# Patient Record
Sex: Female | Born: 1945 | Race: White | Hispanic: No | Smoking: Never smoker
Health system: Southern US, Community
[De-identification: ages and names within clinical notes are randomized; demographics above are authoritative.]

## PROBLEM LIST (undated history)

## (undated) DIAGNOSIS — C439 Malignant melanoma of skin, unspecified: Secondary | ICD-10-CM

## (undated) DIAGNOSIS — E049 Nontoxic goiter, unspecified: Secondary | ICD-10-CM

## (undated) DIAGNOSIS — R112 Nausea with vomiting, unspecified: Secondary | ICD-10-CM

## (undated) DIAGNOSIS — R200 Anesthesia of skin: Secondary | ICD-10-CM

## (undated) DIAGNOSIS — R202 Paresthesia of skin: Secondary | ICD-10-CM

## (undated) DIAGNOSIS — K219 Gastro-esophageal reflux disease without esophagitis: Secondary | ICD-10-CM

## (undated) DIAGNOSIS — M19049 Primary osteoarthritis, unspecified hand: Secondary | ICD-10-CM

## (undated) DIAGNOSIS — D649 Anemia, unspecified: Secondary | ICD-10-CM

## (undated) DIAGNOSIS — I10 Essential (primary) hypertension: Secondary | ICD-10-CM

## (undated) DIAGNOSIS — Z87898 Personal history of other specified conditions: Secondary | ICD-10-CM

## (undated) DIAGNOSIS — Z9889 Other specified postprocedural states: Secondary | ICD-10-CM

## (undated) HISTORY — DX: Essential (primary) hypertension: I10

## (undated) HISTORY — PX: APPENDECTOMY: SHX54

## (undated) HISTORY — PX: ESOPHAGOGASTRODUODENOSCOPY: SHX1529

## (undated) HISTORY — DX: Anemia, unspecified: D64.9

## (undated) HISTORY — PX: OTHER SURGICAL HISTORY: SHX169

## (undated) HISTORY — DX: Primary osteoarthritis, unspecified hand: M19.049

## (undated) HISTORY — PX: TUBAL LIGATION: SHX77

## (undated) HISTORY — DX: Gastro-esophageal reflux disease without esophagitis: K21.9

## (undated) HISTORY — PX: BREAST BIOPSY: SHX20

## (undated) HISTORY — DX: Malignant melanoma of skin, unspecified: C43.9

## (undated) HISTORY — DX: Nontoxic goiter, unspecified: E04.9

## (undated) HISTORY — PX: PARTIAL HYSTERECTOMY: SHX80

## (undated) HISTORY — DX: Personal history of other specified conditions: Z87.898

---

## 2000-02-06 ENCOUNTER — Encounter: Payer: Self-pay | Admitting: Family Medicine

## 2000-02-06 ENCOUNTER — Encounter: Admission: RE | Admit: 2000-02-06 | Discharge: 2000-02-06 | Payer: Self-pay | Admitting: Family Medicine

## 2000-02-06 ENCOUNTER — Other Ambulatory Visit: Admission: RE | Admit: 2000-02-06 | Discharge: 2000-02-06 | Payer: Self-pay | Admitting: Family Medicine

## 2001-05-09 LAB — FECAL OCCULT BLOOD, GUAIAC: Fecal Occult Blood: NEGATIVE

## 2004-05-08 ENCOUNTER — Other Ambulatory Visit: Admission: RE | Admit: 2004-05-08 | Discharge: 2004-05-08 | Payer: Self-pay | Admitting: Family Medicine

## 2004-09-27 ENCOUNTER — Ambulatory Visit: Payer: Self-pay | Admitting: Family Medicine

## 2006-01-03 ENCOUNTER — Ambulatory Visit: Payer: Self-pay | Admitting: Family Medicine

## 2006-01-06 ENCOUNTER — Encounter (INDEPENDENT_AMBULATORY_CARE_PROVIDER_SITE_OTHER): Payer: Self-pay | Admitting: Internal Medicine

## 2006-01-06 HISTORY — PX: OTHER SURGICAL HISTORY: SHX169

## 2006-01-06 LAB — CONVERTED CEMR LAB: Pap Smear: NORMAL

## 2006-01-24 ENCOUNTER — Other Ambulatory Visit: Admission: RE | Admit: 2006-01-24 | Discharge: 2006-01-24 | Payer: Self-pay | Admitting: Internal Medicine

## 2006-01-24 ENCOUNTER — Ambulatory Visit: Payer: Self-pay | Admitting: Family Medicine

## 2006-02-27 ENCOUNTER — Ambulatory Visit: Payer: Self-pay | Admitting: Family Medicine

## 2006-08-23 ENCOUNTER — Ambulatory Visit: Payer: Self-pay | Admitting: Family Medicine

## 2006-09-02 ENCOUNTER — Ambulatory Visit: Payer: Self-pay | Admitting: Family Medicine

## 2006-12-05 ENCOUNTER — Ambulatory Visit: Payer: Self-pay | Admitting: Internal Medicine

## 2006-12-05 ENCOUNTER — Encounter (INDEPENDENT_AMBULATORY_CARE_PROVIDER_SITE_OTHER): Payer: Self-pay | Admitting: *Deleted

## 2006-12-05 LAB — CONVERTED CEMR LAB
Bilirubin Urine: NEGATIVE
Glucose, Urine, Semiquant: NEGATIVE
Ketones, urine, test strip: NEGATIVE
Nitrite: NEGATIVE
Protein, U semiquant: 30
Specific Gravity, Urine: 1.02
Urobilinogen, UA: 0.2
pH: 5

## 2007-02-04 ENCOUNTER — Encounter (INDEPENDENT_AMBULATORY_CARE_PROVIDER_SITE_OTHER): Payer: Self-pay | Admitting: Internal Medicine

## 2007-02-04 DIAGNOSIS — G43909 Migraine, unspecified, not intractable, without status migrainosus: Secondary | ICD-10-CM

## 2007-02-04 DIAGNOSIS — Z85820 Personal history of malignant melanoma of skin: Secondary | ICD-10-CM

## 2007-02-04 DIAGNOSIS — E049 Nontoxic goiter, unspecified: Secondary | ICD-10-CM | POA: Insufficient documentation

## 2007-02-04 DIAGNOSIS — F449 Dissociative and conversion disorder, unspecified: Secondary | ICD-10-CM

## 2007-02-04 DIAGNOSIS — I10 Essential (primary) hypertension: Secondary | ICD-10-CM | POA: Insufficient documentation

## 2007-02-04 DIAGNOSIS — J309 Allergic rhinitis, unspecified: Secondary | ICD-10-CM | POA: Insufficient documentation

## 2007-02-04 DIAGNOSIS — K219 Gastro-esophageal reflux disease without esophagitis: Secondary | ICD-10-CM | POA: Insufficient documentation

## 2007-02-04 DIAGNOSIS — N950 Postmenopausal bleeding: Secondary | ICD-10-CM

## 2007-06-25 ENCOUNTER — Telehealth (INDEPENDENT_AMBULATORY_CARE_PROVIDER_SITE_OTHER): Payer: Self-pay | Admitting: Internal Medicine

## 2008-03-24 ENCOUNTER — Encounter (INDEPENDENT_AMBULATORY_CARE_PROVIDER_SITE_OTHER): Payer: Self-pay | Admitting: Internal Medicine

## 2008-04-08 HISTORY — PX: EYE SURGERY: SHX253

## 2008-08-03 ENCOUNTER — Ambulatory Visit: Payer: Self-pay | Admitting: Family Medicine

## 2008-08-03 ENCOUNTER — Encounter: Admission: RE | Admit: 2008-08-03 | Discharge: 2008-08-03 | Payer: Self-pay | Admitting: Family Medicine

## 2008-12-29 ENCOUNTER — Ambulatory Visit: Payer: Self-pay | Admitting: Family Medicine

## 2008-12-29 LAB — CONVERTED CEMR LAB: Rapid Strep: NEGATIVE

## 2009-03-17 ENCOUNTER — Telehealth (INDEPENDENT_AMBULATORY_CARE_PROVIDER_SITE_OTHER): Payer: Self-pay | Admitting: Internal Medicine

## 2009-03-22 ENCOUNTER — Encounter (INDEPENDENT_AMBULATORY_CARE_PROVIDER_SITE_OTHER): Payer: Self-pay | Admitting: Internal Medicine

## 2009-03-23 ENCOUNTER — Ambulatory Visit: Payer: Self-pay | Admitting: Family Medicine

## 2009-03-23 ENCOUNTER — Other Ambulatory Visit: Admission: RE | Admit: 2009-03-23 | Discharge: 2009-03-23 | Payer: Self-pay | Admitting: Family Medicine

## 2009-03-23 ENCOUNTER — Encounter (INDEPENDENT_AMBULATORY_CARE_PROVIDER_SITE_OTHER): Payer: Self-pay | Admitting: Internal Medicine

## 2009-03-23 DIAGNOSIS — N951 Menopausal and female climacteric states: Secondary | ICD-10-CM

## 2009-03-24 ENCOUNTER — Encounter (INDEPENDENT_AMBULATORY_CARE_PROVIDER_SITE_OTHER): Payer: Self-pay | Admitting: Internal Medicine

## 2009-03-24 ENCOUNTER — Ambulatory Visit: Payer: Self-pay | Admitting: Family Medicine

## 2009-03-29 ENCOUNTER — Encounter (INDEPENDENT_AMBULATORY_CARE_PROVIDER_SITE_OTHER): Payer: Self-pay | Admitting: Internal Medicine

## 2009-03-29 ENCOUNTER — Encounter: Payer: Self-pay | Admitting: Family Medicine

## 2009-03-30 LAB — CONVERTED CEMR LAB
ALT: 14 units/L (ref 0–35)
AST: 18 units/L (ref 0–37)
BUN: 12 mg/dL (ref 6–23)
CO2: 27 meq/L (ref 19–32)
Calcium: 9.4 mg/dL (ref 8.4–10.5)
Chloride: 110 meq/L (ref 96–112)
Cholesterol: 150 mg/dL (ref 0–200)
Creatinine, Ser: 0.9 mg/dL (ref 0.4–1.2)
GFR calc non Af Amer: 67.51 mL/min (ref >60)
Glucose, Bld: 90 mg/dL (ref 70–99)
HDL: 58.9 mg/dL (ref >39.00)
LDL Cholesterol: 59 mg/dL (ref 0–99)
Potassium: 3.9 meq/L (ref 3.5–5.1)
Sodium: 142 meq/L (ref 135–145)
TSH: 1.37 microintl units/mL (ref 0.35–5.50)
Total CHOL/HDL Ratio: 3
Triglycerides: 160 mg/dL — ABNORMAL HIGH (ref 0.0–149.0)
VLDL: 32 mg/dL (ref 0.0–40.0)
Vit D, 25-Hydroxy: 32 ng/mL (ref 30–89)

## 2009-03-31 ENCOUNTER — Encounter (INDEPENDENT_AMBULATORY_CARE_PROVIDER_SITE_OTHER): Payer: Self-pay | Admitting: *Deleted

## 2009-04-05 ENCOUNTER — Encounter (INDEPENDENT_AMBULATORY_CARE_PROVIDER_SITE_OTHER): Payer: Self-pay | Admitting: *Deleted

## 2009-05-05 ENCOUNTER — Ambulatory Visit: Payer: Self-pay | Admitting: Family Medicine

## 2009-06-09 ENCOUNTER — Ambulatory Visit: Payer: Self-pay | Admitting: Family Medicine

## 2009-11-01 ENCOUNTER — Ambulatory Visit: Payer: Self-pay | Admitting: Family Medicine

## 2009-11-01 DIAGNOSIS — M19049 Primary osteoarthritis, unspecified hand: Secondary | ICD-10-CM | POA: Insufficient documentation

## 2010-03-27 ENCOUNTER — Ambulatory Visit: Payer: Self-pay | Admitting: Family Medicine

## 2010-03-27 LAB — CONVERTED CEMR LAB
BUN: 16 mg/dL (ref 6–23)
Basophils Absolute: 0 10*3/uL (ref 0.0–0.1)
Bilirubin, Direct: 0.1 mg/dL (ref 0.0–0.3)
CO2: 26 meq/L (ref 19–32)
Chloride: 107 meq/L (ref 96–112)
Cholesterol: 176 mg/dL (ref 0–200)
Creatinine, Ser: 0.8 mg/dL (ref 0.4–1.2)
Eosinophils Absolute: 0.1 10*3/uL (ref 0.0–0.7)
LDL Cholesterol: 72 mg/dL (ref 0–99)
MCHC: 34.8 g/dL (ref 30.0–36.0)
MCV: 90.2 fL (ref 78.0–100.0)
Monocytes Absolute: 0.6 10*3/uL (ref 0.1–1.0)
Neutrophils Relative %: 47.2 % (ref 43.0–77.0)
Platelets: 371 10*3/uL (ref 150.0–400.0)
RDW: 13 % (ref 11.5–14.6)
Total Bilirubin: 0.4 mg/dL (ref 0.3–1.2)
Triglycerides: 187 mg/dL — ABNORMAL HIGH (ref 0.0–149.0)
WBC: 5.9 10*3/uL (ref 4.5–10.5)

## 2010-03-30 ENCOUNTER — Encounter: Payer: Self-pay | Admitting: Family Medicine

## 2010-03-31 ENCOUNTER — Encounter: Payer: Self-pay | Admitting: Family Medicine

## 2010-04-03 ENCOUNTER — Ambulatory Visit: Payer: Self-pay | Admitting: Family Medicine

## 2010-04-26 ENCOUNTER — Ambulatory Visit: Payer: Self-pay | Admitting: Family Medicine

## 2010-05-01 ENCOUNTER — Telehealth: Payer: Self-pay | Admitting: Family Medicine

## 2010-06-14 ENCOUNTER — Ambulatory Visit: Payer: Self-pay | Admitting: Family Medicine

## 2010-06-14 DIAGNOSIS — L738 Other specified follicular disorders: Secondary | ICD-10-CM

## 2010-06-21 ENCOUNTER — Telehealth: Payer: Self-pay | Admitting: Family Medicine

## 2010-08-08 NOTE — Progress Notes (Signed)
  Phone Note Outgoing Call   Call placed by: Mills Koller,  May 01, 2010 3:33 PM Call placed to: Patient Summary of Call: FYI  Ifob not returned, spoke to patient , she will do it and send it in Initial call taken by: Mills Koller,  May 01, 2010 3:34 PM

## 2010-08-08 NOTE — Assessment & Plan Note (Signed)
Summary: ROA FOR FOLLOW-UP/JRR   Vital Signs:  Patient profile:   65 year old female Height:      59.75 inches Weight:      132.50 pounds BMI:     26.19 Temp:     97.9 degrees F oral Pulse rate:   80 / minute Pulse rhythm:   regular BP sitting:   116 / 74  (left arm) Cuff size:   regular  Vitals Entered By: Lewanda Rife LPN (April 03, 2010 8:35 AM) CC: follow-up visit   History of Present Illness: here for check up  wt is stable with bmi of 26  HTN is well controlled at 116/74  other wellness labs all nl   pap 9/10 nl --no abn paps and no symptoms  mam 9/11 nl  self exam - tends to be lumpy in general/ fibrocystic - no changes  is still taking premarin  she cannot come off it -- she has absolutely intolerable side eff and she cannot tolerate it   colon screen-- is not interested in colonoscopy  will do stool card  tends to stay constipated- her whole life   started walking - and that helps  has not needed celebrex in a while - has hand OA  Td 05 zostavax last fall flu shot   nl dexa 9/10 ca and D  cholesterol is great with high HDL and LDL of 72   mother and sister had breast cancer    Allergies: 1)  * Sulfa (Sulfonamides) Group  Past History:  Past Medical History: Allergic rhinitis GERD Hypertension melanoma  menopausal syndrome  migraine  ? goiter  fibrocystic breasts  strong fam hx of breast cancer  OA - in hands     derm- Dr Yetta Barre   Past Surgical History:                     Appendectomy  ~ 1985         Hysterectomy - ovaries in                     BTL 8/01             EGD -nml - 24 hour PH probe (-) 5/05             EMG's lef tupper arm (-) 5/05             MRI C-Spine - minor disc changes/osteophites, minor focal disk bulge C6-7 11/02           Bone densitometry (-), 7/07(-) 04/2008--L eye surgery for double vision nl breast bx in distant past   Review of Systems General:  Denies fatigue, loss of appetite, and  malaise. Eyes:  Denies blurring and eye irritation. CV:  Denies chest pain or discomfort, lightheadness, and palpitations. Resp:  Denies cough and shortness of breath. GI:  Denies abdominal pain, change in bowel habits, indigestion, and nausea. GU:  Denies dysuria and urinary frequency. MS:  Denies joint pain, joint redness, and joint swelling. Derm:  Denies itching, lesion(s), poor wound healing, and rash. Neuro:  Denies numbness and tingling. Psych:  Denies anxiety and depression. Endo:  Denies excessive thirst and excessive urination. Heme:  Denies abnormal bruising and bleeding.  Physical Exam  General:  Well-developed,well-nourished,in no acute distress; alert,appropriate and cooperative throughout examination Head:  normocephalic, atraumatic, and no abnormalities observed.   Eyes:  vision grossly intact, pupils equal, pupils round, and pupils reactive to light.  no conjunctival pallor, injection or icterus  Ears:  R ear normal and L ear normal.   Nose:  no nasal discharge.   Mouth:  pharynx pink and moist.   Neck:  supple with full rom and no masses or thyromegally, no JVD or carotid bruit  Chest Wall:  No deformities, masses, or tenderness noted. Breasts:  No mass, nodules, thickening, tenderness, bulging, retraction, inflamation, nipple discharge or skin changes noted.   Lungs:  Normal respiratory effort, chest expands symmetrically. Lungs are clear to auscultation, no crackles or wheezes. Heart:  Normal rate and regular rhythm. S1 and S2 normal without gallop, murmur, click, rub or other extra sounds. Abdomen:  Bowel sounds positive,abdomen soft and non-tender without masses, organomegaly or hernias noted. no renal bruits  Msk:  No deformity or scoliosis noted of thoracic or lumbar spine.  no acute joint changes  Pulses:  R and L carotid,radial,femoral,dorsalis pedis and posterior tibial pulses are full and equal bilaterally Extremities:  No clubbing, cyanosis, edema, or  deformity noted with normal full range of motion of all joints.   Neurologic:  sensation intact to light touch, gait normal, and DTRs symmetrical and normal.   Skin:  Intact without suspicious lesions or rashes Cervical Nodes:  No lymphadenopathy noted Inguinal Nodes:  No significant adenopathy Psych:  normal affect, talkative and pleasant    Impression & Recommendations:  Problem # 1:  HEALTH MAINTENANCE EXAM (ICD-V70.0) Assessment Comment Only reviewed health habits including diet, exercise and skin cancer prevention reviewed health maintenance list and family history pt declines colonosc- will do stool card rev labs in detail- good chol  Problem # 2:  HX, FAMILY, MALIGNANCY, BREAST (ICD-V16.3) Assessment: Unchanged pt understands risks of hrt and absolutely refuses to go off of it  claims she is totally disabled by menopausal symptoms off of it and cannot function  may try to cut dose  disc this in detail   Problem # 3:  HRT (ICD-V07.4) Assessment: Unchanged see above - disc risks in detail  Problem # 4:  HYPERTENSION (ICD-401.9) Assessment: Unchanged  good control on low dose ace Her updated medication list for this problem includes:    Lisinopril 2.5 Mg Tabs (Lisinopril) .Marland Kitchen... 1 by mouth once daily  BP today: 116/74 Prior BP: 124/84 (11/01/2009)  Labs Reviewed: K+: 4.4 (03/27/2010) Creat: : 0.8 (03/27/2010)   Chol: 176 (03/27/2010)   HDL: 66.30 (03/27/2010)   LDL: 72 (03/27/2010)   TG: 187.0 (03/27/2010)  Complete Medication List: 1)  Celebrex 200 Mg Caps (Celecoxib) .... Take 1 capsule by mouth twice a day as needed 2)  Imitrex 20 Mg/act Soln (Sumatriptan) .... Use at onset of headache 3)  Xanax 0.25 Mg Tabs (Alprazolam) .Marland Kitchen.. 1 by mouth once daily as needed for airplane flight 4)  Tylenol Arthritis Pain 650 Mg Tbcr (Acetaminophen) .... As needed 5)  Premarin 0.625 Mg Tabs (Estrogens conjugated) .Marland Kitchen.. 1 each day for hrt 6)  Lisinopril 2.5 Mg Tabs (Lisinopril) .Marland Kitchen..  1 by mouth once daily  Patient Instructions: 1)  you can start cutting hormone dose any time  2)  labs look good 3)  try to keep up good diet and exercise  4)  no change in medicines 5)  do stool card please  Prescriptions: XANAX 0.25 MG TABS (ALPRAZOLAM) 1 by mouth once daily as needed for airplane flight  #5 x 0   Entered and Authorized by:   Judith Part MD   Signed by:   Judith Part MD  on 04/03/2010   Method used:   Print then Give to Patient   RxID:   [id number]   Current Allergies (reviewed today): * SULFA (SULFONAMIDES) GROUP   Preventive Care Screening  Contraindications of Treatment or Deferment of Test/Procedure:    Test/Procedure: Colonoscopy    Reason for deferment: patient declined

## 2010-08-08 NOTE — Letter (Signed)
Summary: Results Follow up Letter  [place] at [facility]  [address] [place], [place] [address]   Phone: [phone]  Fax: [phone]    04/03/2010 MRN: [id number]    Joann Buchanan [address] [place], [place]  [address]    Dear [name],  The following are the results of your recent test(s):  Test         Result    Pap Smear:        Normal _____  Not Normal _____ Comments: ______________________________________________________ Cholesterol: LDL(Bad cholesterol):         Your goal is less than:         HDL (Good cholesterol):       Your goal is more than: Comments:  ______________________________________________________ Mammogram:        Normal ___X__  Not Normal _____ Comments:Repeat in one year.   ___________________________________________________________________ Hemoccult:        Normal _____  Not normal _______ Comments:    _____________________________________________________________________ Other Tests:    We routinely do not discuss normal results over the telephone.  If you desire a copy of the results, or you have any questions about this information we can discuss them at your next office visit.   Sincerely,    Idamae Schuller Tower,MD  MT/ri

## 2010-08-08 NOTE — Assessment & Plan Note (Signed)
Summary: CHECK BUMPS ON LEGS/CLE   Vital Signs:  Patient profile:   65 year old female Height:      59.75 inches Weight:      134 pounds BMI:     26.48 Temp:     97.9 degrees F oral Pulse rate:   80 / minute Pulse rhythm:   regular BP sitting:   138 / 80  (left arm) Cuff size:   regular  Vitals Entered By: Lewanda Rife LPN (June 14, 2010 11:24 AM) CC: ck bumps on legs, pt says legs feel warm and swollen   History of Present Illness: broke out on her legs on thursday -- almost a week ago   she tends to break out in past with tennis shoes and socks  did that tues and wed  started as little red bumps that turn into brown spots  sore -- with warmth  worse when on her feet hurt to bump them  not itchy   legs have been a bit swollen  she feets good  no fleas in the house   usually shaves regularly   does use downy sheets and tide detergent   has mild varicose veins -- small spider veins   Allergies: 1)  * Sulfa (Sulfonamides) Group  Past History:  Past Medical History: Last updated: 04/03/2010 Allergic rhinitis GERD Hypertension melanoma  menopausal syndrome  migraine  ? goiter  fibrocystic breasts  strong fam hx of breast cancer  OA - in hands     derm- Dr Yetta Barre   Past Surgical History: Last updated: 04/03/2010                     Appendectomy  ~ 1985         Hysterectomy - ovaries in                     BTL 8/01             EGD -nml - 24 hour PH probe (-) 5/05             EMG's lef tupper arm (-) 5/05             MRI C-Spine - minor disc changes/osteophites, minor focal disk bulge C6-7 11/02           Bone densitometry (-), 7/07(-) 04/2008--L eye surgery for double vision nl breast bx in distant past   Social History: Last updated: 03/23/2009 Marital Status: Married Children: 2--3 grand children Occupation: [employer] retire the first of 2011--08/07/2009  Risk Factors: Alcohol Use: 0 (03/23/2009) Caffeine Use: 3  (03/23/2009) Exercise: yes (03/23/2009)  Risk Factors: Smoking Status: never (03/23/2009)  Review of Systems General:  Denies sleep disorder, sweats, weakness, and weight loss. Eyes:  Denies blurring and eye irritation. CV:  Denies chest pain or discomfort and palpitations. Resp:  Denies cough and wheezing. GI:  Denies abdominal pain, nausea, and vomiting. GU:  Denies genital sores. MS:  Denies joint pain. Derm:  Complains of rash; denies itching. Neuro:  Denies headaches and tingling. Heme:  Denies abnormal bruising and bleeding.  Physical Exam  General:  Well-developed,well-nourished,in no acute distress; alert,appropriate and cooperative throughout examination Head:  normocephalic, atraumatic, and no abnormalities observed.   Mouth:  pharynx pink and moist.   Neck:  supple with full rom and no masses or thyromegally, no JVD or carotid bruit  Lungs:  Normal respiratory effort, chest expands symmetrically. Lungs are clear to  auscultation, no crackles or wheezes. Heart:  Normal rate and regular rhythm. S1 and S2 normal without gallop, murmur, click, rub or other extra sounds. Msk:  no acute joint changes  Pulses:  R and L carotid,radial,femoral,dorsalis pedis and posterior tibial pulses are full and equal bilaterally Extremities:  no cce  Neurologic:  sensation intact to light touch and gait normal.   Skin:  lower leg under knee diffuse patchy (papulomacular rash) with redness consistent with folliculitis no pustules no varicosities  no material to cx  Cervical Nodes:  No lymphadenopathy noted Psych:  normal affect, talkative and pleasant    Impression & Recommendations:  Problem # 1:  FOLLICULITIS (ICD-704.8) Assessment New  of lower legs with redness and soreness will tx with keflex  urged not to shave and get rid of contam razor  keflex 1 wk/ elicon as needed  if not imp will call for derm ref and poss bx  Orders: Prescription Created Electronically  [phone])  Complete Medication List: 1)  Celebrex 200 Mg Caps (Celecoxib) .... Take 1 capsule by mouth twice a day as needed 2)  Imitrex 20 Mg/act Soln (Sumatriptan) .... Use at onset of headache 3)  Xanax 0.25 Mg Tabs (Alprazolam) .Marland Kitchen.. 1 by mouth once daily as needed for airplane flight 4)  Tylenol Arthritis Pain 650 Mg Tbcr (Acetaminophen) .... As needed 5)  Premarin 0.625 Mg Tabs (Estrogens conjugated) .Marland Kitchen.. 1 each day for hrt 6)  Lisinopril 2.5 Mg Tabs (Lisinopril) .Marland Kitchen.. 1 by mouth once daily 7)  Keflex 500 Mg Caps (Cephalexin) .Marland Kitchen.. 1 by mouth two times a day for 7 days 8)  Elocon 0.1 % Crea (Mometasone furoate) .... Apply to affected area once daily as needed  Patient Instructions: 1)  throw away razor 2)  no shaving legs for 2-3 weeks 3)  take keflex  4)  use elicon cream daily as needed  5)  elevate legs when able and keep them cool 6)  consider fragrance free detergent and no fabric softener  7)  if not improved in 7-10 days call here for dermatology referral Prescriptions: ELOCON 0.1 % CREA (MOMETASONE FUROATE) apply to affected area once daily as needed  #1 medium x 0   Entered and Authorized by:   Judith Part MD   Signed by:   Judith Part MD on 06/14/2010   Method used:   [facility]        [facility] [facility]       [address]       [place], [place]  [address]       Ph: [phone] or [phone]       Fax: [phone]   RxID:   [id number] KEFLEX 500 MG CAPS (CEPHALEXIN) 1 by mouth two times a day for 7 days  #14 x 0   Entered and Authorized by:   Judith Part MD   Signed by:   Judith Part MD on 06/14/2010   Method used:   [facility]        [facility]       [address]       [place], [place]  [address]       Ph: [phone] or [phone]       Fax: [phone]   RxID:   [id number]    Orders Added: 1)  Prescription Created Electronically [G8553] 2)  Est. Patient Level III  [06237]    Current Allergies (reviewed today): *  SULFA (SULFONAMIDES) GROUP

## 2010-08-08 NOTE — Assessment & Plan Note (Signed)
Summary: ROA 6 MTHS CYD   Vital Signs:  Patient profile:   65 year old female Height:      59.75 inches Weight:      132.75 pounds BMI:     26.24 Temp:     98 degrees F oral Pulse rate:   76 / minute Pulse rhythm:   regular BP sitting:   124 / 84  (left arm) Cuff size:   regular  Vitals Entered By: Lewanda Rife LPN (November 01, 2009 8:08 AM) CC: six month visit   History of Present Illness: here for f/u of HTN and other chronic problems   wt is stable  bp is well controlled 124/.84 with lisinopril  is getting into an exercise pattern with retirement    lab good in fall incl LDL 59 diet is overall good   premarin for menopausal symptoms  did disc going down on the dose further-- wants to stay at that dose  has night sweats and does not sleep well- is awake every hour  has taken tylenol pm works for 3 hours  no mood symptoms     celebrex -- was on for a while  has arthritis in her fingers -- esp middle finger L hand and index  is really bad grip is terrible hips hurt  bottoms of feet    Allergies: 1)  * Sulfa (Sulfonamides) Group  Past History:  Past Surgical History: Last updated: 12/29/2008                     Appendectomy  ~ 1985         Hysterectomy - ovaries in                     BTL 8/01             EGD -nml - 24 hour PH probe (-) 5/05             EMG's lef tupper arm (-) 5/05             MRI C-Spine - minor disc changes/osteophites, minor focal disk bulge C6-7 11/02           Bone densitometry (-), 7/07(-) 04/2008--L eye surgery for double vision  Social History: Last updated: 03/23/2009 Marital Status: Married Children: 2--3 grand children Occupation: [employer] retire the first of 2011--08/07/2009  Risk Factors: Alcohol Use: 0 (03/23/2009) Caffeine Use: 3 (03/23/2009) Exercise: yes (03/23/2009)  Risk Factors: Smoking Status: never (03/23/2009)  Past Medical History: Allergic rhinitis GERD Hypertension melanoma    menopausal syndrome  migraine  ? goiter     derm- Dr Yetta Barre   Review of Systems General:  Complains of fatigue; denies loss of appetite and malaise. Eyes:  Denies blurring and eye irritation. CV:  Denies chest pain or discomfort, lightheadness, palpitations, and shortness of breath with exertion. Resp:  Denies cough and wheezing. GI:  Denies abdominal pain, bloody stools, change in bowel habits, indigestion, and nausea. GU:  Denies discharge and dysuria. MS:  Complains of joint pain, joint swelling, and stiffness; denies joint redness, muscle aches, cramps, and muscle weakness. Derm:  Denies itching, lesion(s), poor wound healing, and rash. Neuro:  Denies numbness and tingling. Psych:  Denies anxiety and depression. Heme:  Denies abnormal bruising and bleeding.  Physical Exam  General:  Well-developed,well-nourished,in no acute distress; alert,appropriate and cooperative throughout examination Head:  normocephalic, atraumatic, and no abnormalities observed.   Eyes:  vision grossly intact,  pupils equal, pupils round, pupils reactive to light, and no injection.   Ears:  R ear normal and L ear normal.   Nose:  nares are mildly pale and boggy Mouth:  pharynx pink and moist, no erythema, and no exudates.   Neck:  supple with full rom and no masses or thyromegally, no JVD or carotid bruit  large- nl thyroid size - symmetric Chest Wall:  No deformities, masses, or tenderness noted. Lungs:  Normal respiratory effort, chest expands symmetrically. Lungs are clear to auscultation, no crackles or wheezes. Heart:  Normal rate and regular rhythm. S1 and S2 normal without gallop, murmur, click, rub or other extra sounds. Abdomen:  Bowel sounds positive,abdomen soft and non-tender without masses, organomegaly or hernias noted. no renal bruits  Msk:  L 4th finger and R 2nd finger- tender herberden's nodes R 5th finger- mild triggering  no redness  no tophi noted nl rom fingers  Pulses:  R and  L carotid,radial,femoral,dorsalis pedis and posterior tibial pulses are full and equal bilaterally Extremities:  No clubbing, cyanosis, edema, or deformity noted with normal full range of motion of all joints.   Neurologic:  sensation intact to light touch, gait normal, and DTRs symmetrical and normal.   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 Take 1 capsule by mouth twice a day as needed    Tylenol Arthritis Pain 650 Mg Tbcr (Acetaminophen) .Marland Kitchen... As needed  Orders: Orthopedic Referral (Ortho)  Problem # 3:  POSTMENOPAUSAL STATUS (ICD-V49.81) Assessment: Unchanged pt wants to stay on current premarin dose -- menopause symptoms are bothersome- esp sleep   Problem # 4:  HYPERTENSION (ICD-401.9) in good control disc exercise plan  no change in med plan lab and fu fall- PE  rev sept labs- good  The following medications were removed from the medication list:    Lisinopril 5 Mg Tabs (Lisinopril) .Marland Kitchen... Take 1 once daily for bp Her updated medication list for this problem includes:    Lisinopril 2.5 Mg Tabs (Lisinopril) .Marland Kitchen... 1 by mouth once daily  Complete Medication List: 1)  Celebrex 200 Mg Caps (Celecoxib) .... Take 1 capsule by mouth twice a day as needed 2)  Imitrex 20 Mg/act Soln (Sumatriptan) .... Use at onset of headache 3)  Xanax 0.25 Mg Tabs (Alprazolam) .... As needed for travel 4)  Tylenol Arthritis Pain 650 Mg Tbcr (Acetaminophen) .... As  needed 5)  Premarin 0.625 Mg Tabs (Estrogens conjugated) .Marland Kitchen.. 1 each day for hrt 6)  Lisinopril 2.5 Mg Tabs (Lisinopril) .Marland Kitchen.. 1 by mouth once daily  Other Orders: Dermatology Referral (Derma)  Patient Instructions: 1)  we will do derm referral and also hand doctor ref at check out  2)  try claritin or allegra or zyrtec over the counter for allergies  3)  follow up with your eye doctor as scheduled  4)  keep working on diet and exercise  5)  schedule fasting labs in late sept then PE  6)  lipids , wellness  v70.0, 401.1  7)  we will set up mammogram at check out   Prescriptions: LISINOPRIL 2.5 MG TABS (LISINOPRIL) 1 by mouth once daily  #90 x 3   Entered and Authorized by:   Judith Part MD   Signed by:   Judith Part MD on 11/01/2009   Method used:   Print then Give to Patient   RxID:   [id number] PREMARIN 0.625 MG TABS (ESTROGENS CONJUGATED) 1 each day for HRT  #90 x 3   Entered and Authorized by:   Judith Part MD   Signed by:   Judith Part MD on 11/01/2009   Method used:   Print then Give to Patient   RxID:   [id number]   Current Allergies (reviewed today): * SULFA (SULFONAMIDES) GROUP

## 2010-08-08 NOTE — Letter (Signed)
Summary: [facility]: Immunoassay Fecal Occult Blood (iFOB) Order Form  [place] at [facility]  [address] [place], [place] [address]   Phone: [phone]  Fax: [phone]      [place] [place] [facility]: Immunoassay Fecal Occult Blood (iFOB) Order Form   April 03, 2010 MRN: [id number]   Joann Buchanan Aug 03, 1945   Physicican Name:______Tower___________________  Diagnosis Code:____________V76.51______________      Judith Part MD

## 2010-08-08 NOTE — Miscellaneous (Signed)
Summary: Controlled Substances Contract  Controlled Substances Contract   Imported By: Maryln Gottron 04/10/2010 09:49:09  _____________________________________________________________________  External Attachment:    Type:   Image     Comment:   External Document

## 2010-08-10 NOTE — Progress Notes (Signed)
Summary: requests more abx  Phone Note Call from Patient Call back at Home Phone [phone]   Caller: Patient Call For: Judith Part MD Summary of Call: Pt was seen last week for a rash on her legs.  She was told to call back today if not completely gone.  She says she is much better but still has several splotches, skin feels tight and tingly.  She is asking for a refill on keflex to be [facility].  She thinks one more round will take care of the rash. Initial call taken by: Lowella Petties CMA, AAMA,  June 21, 2010 8:43 AM  Follow-up for Phone Call        can refil keflex times one-- but if not resolved at that time please f/u px written on EMR for call in  Follow-up by: Judith Part MD,  June 21, 2010 9:40 AM  Additional Follow-up for Phone Call Additional follow up Details #1::        Patient notified as instructed by telephone. med [facility] as instructed.Lewanda Rife LPN  June 21, 2010 11:02 AM     Prescriptions: KEFLEX 500 MG CAPS (CEPHALEXIN) 1 by mouth two times a day for 7 days  #14 x 0   Entered by:   Lewanda Rife LPN   Authorized by:   Judith Part MD   Signed by:   Lewanda Rife LPN on 14/78/2956   Method used:   [facility]        [facility] [facility]       [address]       [place], [place]  [address]       Ph: [phone] or [phone]       Fax: [phone]   RxID:   [id number]

## 2010-09-27 ENCOUNTER — Other Ambulatory Visit: Payer: Self-pay | Admitting: Family Medicine

## 2010-10-03 ENCOUNTER — Other Ambulatory Visit: Payer: Self-pay | Admitting: Family Medicine

## 2010-10-05 NOTE — Telephone Encounter (Signed)
Pt needs to call for appt. 

## 2011-06-01 ENCOUNTER — Other Ambulatory Visit: Payer: Self-pay | Admitting: Family Medicine

## 2011-08-21 ENCOUNTER — Encounter: Payer: Self-pay | Admitting: Family Medicine

## 2011-08-27 ENCOUNTER — Encounter: Payer: Self-pay | Admitting: *Deleted

## 2011-10-15 ENCOUNTER — Ambulatory Visit (INDEPENDENT_AMBULATORY_CARE_PROVIDER_SITE_OTHER): Payer: 59 | Admitting: Family Medicine

## 2011-10-15 ENCOUNTER — Encounter: Payer: Self-pay | Admitting: Family Medicine

## 2011-10-15 VITALS — BP 144/84 | HR 84 | Temp 98.1°F | Wt 135.8 lb

## 2011-10-15 DIAGNOSIS — G43909 Migraine, unspecified, not intractable, without status migrainosus: Secondary | ICD-10-CM

## 2011-10-15 MED ORDER — CYCLOBENZAPRINE HCL 5 MG PO TABS: 5.0000 mg | ORAL_TABLET | Freq: Two times a day (BID) | ORAL | Status: AC | PRN

## 2011-10-15 MED ORDER — SUMATRIPTAN 20 MG/ACT NA SOLN: 1.0000 | NASAL | Status: DC | PRN

## 2011-10-15 NOTE — Patient Instructions (Addendum)
Sounds like previous migraines. May try low dose flexeril at home (may make you sleepy) for stopping migraines. i've sent in refill of sumatriptan nasal spray. If not improving, or migraines worsening or changing more , let us know we may want to see you again.

## 2011-10-15 NOTE — Assessment & Plan Note (Addendum)
Episode last week similar to previous migraines. Refilled sumatriptan nasal spray to use prn migraines, discussed use. May try flexeril as well abortively Nonfocal neuro exam. If worsening or changing migraine, return to see [place]. Advised contact PCP for premarin refill.

## 2011-10-15 NOTE — Progress Notes (Signed)
  Subjective:    Patient ID: Joann Buchanan, female    DOB: 03-23-1946, 66 y.o.   MRN: [id number]  HPI CC: migraine  66 yo with h/o melanoma, migraines, and HTN.  Presents with 1 wk h/o migraine, associated with nausea/vomiting, mild dizziness described as "off balance" and walking at an ankle.  Migraines usually associated with photophobia, n/v, imbalance.  This one has lasted longer than usual (usually after 1-2 days feels better).  Yesterday went to church and felt ok but towards end of church started feeling worse.  Stayed in bed yesterday.  No falls, no vertigo.  Feels pain across entire forehead.  Currently no pain, nausea but does have sensation of facial fullness.  Has run out of nasal sumatriptan and premarin, requests refill.  Off premarin for last year.  New visual changes described as flickering lights in superior 1/4 of vision.  No fevers/chills, abd pain, chest pain or tightness.  Denies slurred speech, weakness or numbness on one side of body.  Usually takes [facility] powders for migraines, this one was different.  Last bad migraine was 4 mo ago.  Ran out of nasal sumatriptan about 1 yr ago.  Lab Results  Component Value Date   CREATININE 0.8 03/27/2010    BP Readings from Last 3 Encounters:  10/15/11 144/84  06/14/10 138/80  04/03/10 116/74   Past Medical History  Diagnosis Date  . Allergic rhinitis   . GERD (gastroesophageal reflux disease)   . HTN (hypertension)   . Melanoma   . Migraine   . Hx of fibrocystic disease of breast   . Osteoarthritis of hand   . Goiter     ?      Review of Systems Per HPI    Objective:   Physical Exam  Nursing note and vitals reviewed. Constitutional: She appears well-developed and well-nourished. No distress.  HENT:  Head: Normocephalic and atraumatic.  Mouth/Throat: Oropharynx is clear and moist. No oropharyngeal exudate.  Eyes: Conjunctivae and EOM are normal. Pupils are equal, round, and reactive to Buchanan. No scleral  icterus.  Neck: Normal range of motion. Neck supple.  Cardiovascular: Normal rate, regular rhythm, normal heart sounds and intact distal pulses.   No murmur heard. Pulmonary/Chest: Effort normal and breath sounds normal. No respiratory distress. She has no wheezes. She has no rales.  Musculoskeletal: She exhibits no edema.  Lymphadenopathy:    She has no cervical adenopathy.  Neurological: She has normal strength. No cranial nerve deficit or sensory deficit. She displays a negative Romberg sign. Coordination and gait normal.  Reflex Scores:      Bicep reflexes are 3+ on the right side and 3+ on the left side.      Patellar reflexes are 3+ on the right side and 3+ on the left side.      Hyperreflexic at baseline  Skin: Skin is warm and dry. No rash noted.  Psychiatric: She has a normal mood and affect.      Assessment & Plan:

## 2011-10-16 ENCOUNTER — Other Ambulatory Visit: Payer: Self-pay

## 2011-10-16 MED ORDER — ESTROGENS CONJUGATED 0.625 MG PO TABS: ORAL_TABLET | ORAL | Status: DC

## 2011-10-16 NOTE — Telephone Encounter (Signed)
Pt walked in and left note needed refill on Premarin 0.625 mg. #90 x 0 to [facility] and pt will call for appt to see Dr Milinda Antis before med runs out. Pt saw Dr Sharen Hones 10/15/11 for h/a.Pt is aware med sent to [facility].

## 2012-01-14 ENCOUNTER — Ambulatory Visit (INDEPENDENT_AMBULATORY_CARE_PROVIDER_SITE_OTHER): Payer: 59 | Admitting: Family Medicine

## 2012-01-14 ENCOUNTER — Encounter: Payer: Self-pay | Admitting: Family Medicine

## 2012-01-14 VITALS — BP 120/80 | HR 86 | Temp 97.9°F | Ht 60.0 in | Wt 134.8 lb

## 2012-01-14 DIAGNOSIS — K219 Gastro-esophageal reflux disease without esophagitis: Secondary | ICD-10-CM

## 2012-01-14 DIAGNOSIS — I10 Essential (primary) hypertension: Secondary | ICD-10-CM

## 2012-01-14 DIAGNOSIS — G43909 Migraine, unspecified, not intractable, without status migrainosus: Secondary | ICD-10-CM

## 2012-01-14 DIAGNOSIS — Z780 Asymptomatic menopausal state: Secondary | ICD-10-CM

## 2012-01-14 LAB — COMPREHENSIVE METABOLIC PANEL
Albumin: 3.8 g/dL (ref 3.5–5.2)
Alkaline Phosphatase: 66 U/L (ref 39–117)
CO2: 26 mEq/L (ref 19–32)
Calcium: 9.6 mg/dL (ref 8.4–10.5)
Chloride: 106 mEq/L (ref 96–112)
GFR: 66.05 mL/min (ref >60.00)
Glucose, Bld: 84 mg/dL (ref 70–99)
Potassium: 4.1 mEq/L (ref 3.5–5.1)
Sodium: 139 mEq/L (ref 135–145)
Total Protein: 6.9 g/dL (ref 6.0–8.3)

## 2012-01-14 LAB — CBC WITH DIFFERENTIAL/PLATELET
Eosinophils Relative: 0.9 % (ref 0.0–5.0)
Lymphocytes Relative: 35.7 % (ref 12.0–46.0)
Monocytes Relative: 10.7 % (ref 3.0–12.0)
Neutrophils Relative %: 51.8 % (ref 43.0–77.0)
Platelets: 362 10*3/uL (ref 150.0–400.0)
WBC: 6.1 10*3/uL (ref 4.5–10.5)

## 2012-01-14 LAB — TSH: TSH: 1.31 u[IU]/mL (ref 0.35–5.50)

## 2012-01-14 MED ORDER — RANITIDINE HCL 150 MG PO TABS: 150.0000 mg | ORAL_TABLET | Freq: Two times a day (BID) | ORAL | Status: DC

## 2012-01-14 MED ORDER — ESTROGENS CONJUGATED 0.625 MG PO TABS: ORAL_TABLET | ORAL | Status: DC

## 2012-01-14 MED ORDER — SUMATRIPTAN 20 MG/ACT NA SOLN: 1.0000 | NASAL | Status: DC | PRN

## 2012-01-14 MED ORDER — LISINOPRIL 2.5 MG PO TABS: 2.5000 mg | ORAL_TABLET | Freq: Every day | ORAL | Status: DC

## 2012-01-14 NOTE — Patient Instructions (Addendum)
Labs today  bp is good  Start zantac (ranitidine 150) one pill twice daily Stop caffeine  Drink more water Stop celebrex or any other anti inflammatories (like BC)  Follow up in 6-8 weeks to review labs and see how your reflux is doing

## 2012-01-14 NOTE — Progress Notes (Signed)
Subjective:    Patient ID: Joann Buchanan, female    DOB: 08-03-1945, 66 y.o.   MRN: [id number]  HPI Here for f/u of chronic conditions  Is doing well overall   bp is good     BP Readings from Last 3 Encounters:  01/14/12 139/81  10/15/11 144/84  06/14/10 138/80    No cp or palpitations or headaches or edema  No side effects to medicines   Takes lisinopril  Was here for migraine ha in April  (gave her balance trouble) Had them when she was in her 39s to 83s - spells with it  ? 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) Has celebrex and imitrex and flexeril - it helped a lot  occ used to take a BC  OA in hands - wants to get back on celebrex - has not been on it , tylenol does not help much  Drinks 2 cups of coffee per day  Wants to quit  No carbonated drinks    On HRT - ran out of them for a while - had very severe symptoms  Full blown hot flashes and intractable migraines  Takes premarin every day  Cannot tolerate the symptoms - is willing to take the risk of breast cancer  Had hyst - that was for fibroid tumors/ bleeding  Is not seeing a gyn  Last exam 2 years ago  Not a smoker   Having new problems with gerd symptom Heartburn almost every time she eats for 6 mo with acid in mouth Cough at night tums limited relief No abd pain    Patient Active Problem List  Diagnosis  . MELANOMA, MALIGNANT, TRUNK  . THYROMEGALY  . CONVERSION DISORDER  . MIGRAINE HEADACHE  . HYPERTENSION  . ALLERGIC RHINITIS  . GERD  . POSTMENOPAUSAL BLEEDING  . FOLLICULITIS  . OSTEOARTHRITIS, HANDS, BILATERAL  . Menopausal symptoms   Past Medical History  Diagnosis Date  . Allergic rhinitis   . GERD (gastroesophageal reflux disease)   . HTN (hypertension)   . Melanoma   . Migraine   . Hx of fibrocystic disease of breast   . Osteoarthritis of hand   . Goiter     ?    Past Surgical History  Procedure Date  . Appendectomy   . Partial hysterectomy    Ovaries intact  . Tubal ligation   . Esophagogastroduodenoscopy     normal-24 hour PH probe (-)  . Emg     left upper arm- neg  . Dexa 7/07    negative  . Eye surgery 10/09    Left eye; for double vision  . Breast biopsy     normal   History  Substance Use Topics  . Smoking status: Never Smoker   . Smokeless tobacco: Not on file  . Alcohol Use: No   Family History  Problem Relation Age of Onset  . Breast cancer      strong family history   Allergies  Allergen Reactions  . Sulfonamide Derivatives     REACTION: unspecified   Current Outpatient Prescriptions on File Prior to Visit  Medication Sig Dispense Refill  . ALPRAZolam (XANAX) 0.25 MG tablet Take 0.25 mg by mouth as needed. Prior to flights      . estrogens, conjugated, (PREMARIN) 0.625 MG tablet Take daily for 21 days then do not take for 7 days.  90 tablet  3  . lisinopril (PRINIVIL,ZESTRIL) 2.5 MG tablet Take  1 tablet (2.5 mg total) by mouth daily.  90 tablet  3  . SUMAtriptan (IMITREX) 20 MG/ACT nasal spray Place 1 spray (20 mg total) into the nose every 2 (two) hours as needed for migraine. No more than 2 doses in 24 hours  3 Inhaler  3  . acetaminophen (TYLENOL) 650 MG CR tablet Take 650 mg by mouth every 8 (eight) hours as needed.      . ranitidine (ZANTAC) 150 MG tablet Take 1 tablet (150 mg total) by mouth 2 (two) times daily.  60 tablet  11      Review of Systems     Objective:   Physical Exam  Constitutional: She appears well-developed and well-nourished. No distress.  HENT:  Head: Normocephalic and atraumatic.  Mouth/Throat: Oropharynx is clear and moist.  Eyes: Conjunctivae and EOM are normal. Pupils are equal, round, and reactive to Buchanan. No scleral icterus.  Neck: Normal range of motion. Neck supple. No JVD present. Carotid bruit is not present. No thyromegaly present.  Cardiovascular: Normal rate, regular rhythm, normal heart sounds and intact distal pulses.  Exam reveals no gallop.     Pulmonary/Chest: Effort normal and breath sounds normal. No respiratory distress. She has no wheezes.  Abdominal: Soft. Bowel sounds are normal. She exhibits no distension, no abdominal bruit and no mass. There is tenderness.       Very slt epigastric tenderness without rebound or gaurding   Musculoskeletal: Normal range of motion. She exhibits no edema and no tenderness.       Some changes of OA in fingers   Lymphadenopathy:    She has no cervical adenopathy.  Neurological: She is alert. She has normal reflexes. She displays no tremor. No cranial nerve deficit or sensory deficit. She exhibits normal muscle tone. Coordination and gait normal.       No focal cerebellar signs  Skin: Skin is warm and dry. No rash noted. No erythema. No pallor.  Psychiatric: She has a normal mood and affect.          Assessment & Plan:

## 2012-01-15 NOTE — Assessment & Plan Note (Signed)
Much worse lately Disc diet and lifestyle habits  Start on ranitidine 150 bid and f/u  Update if not starting to improve in a week or if worsening

## 2012-01-15 NOTE — Assessment & Plan Note (Signed)
bp in fair control at this time  No changes needed  Disc lifstyle change with low sodium diet and exercise  Lab today 

## 2012-01-15 NOTE — Assessment & Plan Note (Signed)
Refilled imitrex for infrequent use Disc lifestyle changes needed for migraine prev- incl sleep habits/ avoid caff/ inc water/ exercise

## 2012-01-15 NOTE — Assessment & Plan Note (Signed)
These are severe if off HRT - and after disc pros/ cons and risks (incl breast ca/ clots/ MI and poss CVA)- pt decided to remain on HRT for quality of life This was refilled Disc imp of breast cancer screen

## 2012-03-04 ENCOUNTER — Ambulatory Visit (INDEPENDENT_AMBULATORY_CARE_PROVIDER_SITE_OTHER): Payer: 59 | Admitting: Family Medicine

## 2012-03-04 ENCOUNTER — Encounter: Payer: Self-pay | Admitting: Family Medicine

## 2012-03-04 VITALS — BP 118/68 | HR 85 | Temp 97.9°F | Ht 60.0 in | Wt 130.2 lb

## 2012-03-04 DIAGNOSIS — K219 Gastro-esophageal reflux disease without esophagitis: Secondary | ICD-10-CM

## 2012-03-04 MED ORDER — OMEPRAZOLE 40 MG PO CPDR: 40.0000 mg | DELAYED_RELEASE_CAPSULE | Freq: Every day | ORAL | Status: DC

## 2012-03-04 NOTE — Progress Notes (Signed)
Subjective:    Patient ID: Joann Buchanan, female    DOB: [birth year], 66 y.o.   MRN: [id number]  HPI Here for f/u GERD Was having more symptoms at last visit and started ranitidine 150 bid Wt is down 4 lb  Her symptoms are still on and off - has improved about 10 % at most  At night- she has acid "backing up" in her throat Also coughing -- raspy irritated cough Throat burns in am and hoarse Only once had epigastric pain   In past was on prilosec and nexium  prilosec worked better  Had one EGD- was ok in past Also had a PH probe- did show acid reflux    Usually eats 2 times per day 2nd meal at 4 or 4:30 No late snacks  Even then, has symptoms  Watches diet very carefully 1 c coffee in am, drinks water only after that   Patient Active Problem List  Diagnosis  . MELANOMA, MALIGNANT, TRUNK  . THYROMEGALY  . CONVERSION DISORDER  . MIGRAINE HEADACHE  . HYPERTENSION  . ALLERGIC RHINITIS  . GERD  . FOLLICULITIS  . OSTEOARTHRITIS, HANDS, BILATERAL  . Menopausal symptoms   Past Medical History  Diagnosis Date  . Allergic rhinitis   . GERD (gastroesophageal reflux disease)   . HTN (hypertension)   . Melanoma   . Migraine   . Hx of fibrocystic disease of breast   . Osteoarthritis of hand   . Goiter     ?    Past Surgical History  Procedure Date  . Appendectomy   . Partial hysterectomy     Ovaries intact  . Tubal ligation   . Esophagogastroduodenoscopy     normal-24 hour PH probe (-)  . Emg     left upper arm- neg  . Dexa 7/07    negative  . Eye surgery 10/09    Left eye; for double vision  . Breast biopsy     normal   History  Substance Use Topics  . Smoking status: Never Smoker   . Smokeless tobacco: Not on file  . Alcohol Use: No   Family History  Problem Relation Age of Onset  . Breast cancer      strong family history   Allergies  Allergen Reactions  . Sulfonamide Derivatives     REACTION: unspecified   Current Outpatient Prescriptions  on File Prior to Visit  Medication Sig Dispense Refill  . acetaminophen (TYLENOL) 650 MG CR tablet Take 650 mg by mouth every 8 (eight) hours as needed.      . ALPRAZolam (XANAX) 0.25 MG tablet Take 0.25 mg by mouth as needed. Prior to flights      . estrogens, conjugated, (PREMARIN) 0.625 MG tablet Take daily for 21 days then do not take for 7 days.  90 tablet  3  . lisinopril (PRINIVIL,ZESTRIL) 2.5 MG tablet Take 1 tablet (2.5 mg total) by mouth daily.  90 tablet  3  . ranitidine (ZANTAC) 150 MG tablet Take 1 tablet (150 mg total) by mouth 2 (two) times daily.  60 tablet  11  . SUMAtriptan (IMITREX) 20 MG/ACT nasal spray Place 1 spray (20 mg total) into the nose every 2 (two) hours as needed for migraine. No more than 2 doses in 24 hours  3 Inhaler  3      If she eats later - is up all night    Review of Systems Review of Systems  Constitutional: Negative for fever, appetite  change, fatigue and unexpected weight change.  Eyes: Negative for pain and visual disturbance.  Respiratory: Negative for cough and shortness of breath.   Cardiovascular: Negative for cp or palpitations    Gastrointestinal: Negative for nausea, diarrhea and constipation. neg for blood in stool or dark stools , pos for heartburn Genitourinary: Negative for urgency and frequency.  Skin: Negative for pallor or rash   Neurological: Negative for weakness, Buchanan-headedness, numbness and headaches.  Hematological: Negative for adenopathy. Does not bruise/bleed easily.  Psychiatric/Behavioral: Negative for dysphoric mood. The patient is not nervous/anxious.         Objective:   Physical Exam  Constitutional: She appears well-developed and well-nourished. No distress.  HENT:  Head: Normocephalic and atraumatic.  Mouth/Throat: Oropharynx is clear and moist.  Eyes: Conjunctivae and EOM are normal. Pupils are equal, round, and reactive to Buchanan. No scleral icterus.  Neck: Normal range of motion. Neck supple. No tracheal  deviation present. No thyromegaly present.  Cardiovascular: Normal rate, regular rhythm and normal heart sounds.   Pulmonary/Chest: Effort normal and breath sounds normal. No respiratory distress. She has no wheezes. She has no rales.  Abdominal: Soft. Bowel sounds are normal. She exhibits no distension and no mass. There is no tenderness. There is no rebound and no guarding.  Lymphadenopathy:    She has no cervical adenopathy.  Neurological: She is alert.  Skin: Skin is warm and dry. No pallor.  Psychiatric: She has a normal mood and affect.          Assessment & Plan:

## 2012-03-04 NOTE — Patient Instructions (Addendum)
Watch diet Don't eat late  Try to elevate head of bed with a brick on each side  Take prilosec (omeprazole) 40 mg daily in am  Update me if not symptom free in a month-- would consider follow up with GI

## 2012-03-04 NOTE — Assessment & Plan Note (Signed)
Not improved much with zantac Will switch to omeprazole 40-that has worked for her in past Disc lifestyle /diet- see AVS If not sympt free- will see GI  Has had EGD and PH probe in past -ongoing problem

## 2012-06-04 ENCOUNTER — Other Ambulatory Visit: Payer: Self-pay | Admitting: *Deleted

## 2012-06-04 MED ORDER — SUMATRIPTAN 20 MG/ACT NA SOLN: 1.0000 | NASAL | Status: DC | PRN

## 2012-09-08 ENCOUNTER — Other Ambulatory Visit: Payer: Self-pay | Admitting: Family Medicine

## 2012-12-23 ENCOUNTER — Encounter: Payer: Self-pay | Admitting: Family Medicine

## 2012-12-24 ENCOUNTER — Encounter: Payer: Self-pay | Admitting: *Deleted

## 2013-02-04 ENCOUNTER — Other Ambulatory Visit: Payer: Self-pay | Admitting: *Deleted

## 2013-02-04 MED ORDER — ESTROGENS CONJUGATED 0.625 MG PO TABS: ORAL_TABLET | ORAL | Status: DC

## 2013-02-16 ENCOUNTER — Encounter: Payer: 59 | Admitting: Family Medicine

## 2013-03-03 ENCOUNTER — Ambulatory Visit (INDEPENDENT_AMBULATORY_CARE_PROVIDER_SITE_OTHER): Payer: Medicare Other | Admitting: Family Medicine

## 2013-03-03 ENCOUNTER — Encounter: Payer: Self-pay | Admitting: Family Medicine

## 2013-03-03 VITALS — BP 136/88 | HR 69 | Temp 98.3°F | Ht 60.0 in | Wt 133.2 lb

## 2013-03-03 DIAGNOSIS — K219 Gastro-esophageal reflux disease without esophagitis: Secondary | ICD-10-CM

## 2013-03-03 DIAGNOSIS — Z0000 Encounter for general adult medical examination without abnormal findings: Secondary | ICD-10-CM | POA: Insufficient documentation

## 2013-03-03 DIAGNOSIS — I10 Essential (primary) hypertension: Secondary | ICD-10-CM

## 2013-03-03 DIAGNOSIS — N951 Menopausal and female climacteric states: Secondary | ICD-10-CM

## 2013-03-03 LAB — CBC WITH DIFFERENTIAL/PLATELET
Basophils Relative: 0.6 % (ref 0.0–3.0)
Eosinophils Relative: 0.6 % (ref 0.0–5.0)
HCT: 37.7 % (ref 36.0–46.0)
Hemoglobin: 12.5 g/dL (ref 12.0–15.0)
Lymphs Abs: 2.3 10*3/uL (ref 0.7–4.0)
MCV: 87.8 fl (ref 78.0–100.0)
Monocytes Absolute: 0.6 10*3/uL (ref 0.1–1.0)
Neutro Abs: 4.3 10*3/uL (ref 1.4–7.7)
Neutrophils Relative %: 59.3 % (ref 43.0–77.0)
RBC: 4.3 Mil/uL (ref 3.87–5.11)
WBC: 7.2 10*3/uL (ref 4.5–10.5)

## 2013-03-03 LAB — COMPREHENSIVE METABOLIC PANEL
Albumin: 3.6 g/dL (ref 3.5–5.2)
Alkaline Phosphatase: 62 U/L (ref 39–117)
BUN: 14 mg/dL (ref 6–23)
Calcium: 9.7 mg/dL (ref 8.4–10.5)
Glucose, Bld: 86 mg/dL (ref 70–99)
Potassium: 4.2 mEq/L (ref 3.5–5.1)

## 2013-03-03 LAB — TSH: TSH: 1.27 u[IU]/mL (ref 0.35–5.50)

## 2013-03-03 LAB — LIPID PANEL
Cholesterol: 194 mg/dL (ref 0–200)
VLDL: 29.8 mg/dL (ref 0.0–40.0)

## 2013-03-03 MED ORDER — SUMATRIPTAN 20 MG/ACT NA SOLN: 1.0000 | NASAL | Status: DC | PRN

## 2013-03-03 MED ORDER — OMEPRAZOLE 40 MG PO CPDR: 40.0000 mg | DELAYED_RELEASE_CAPSULE | Freq: Every day | ORAL | Status: DC

## 2013-03-03 MED ORDER — LISINOPRIL 2.5 MG PO TABS: 2.5000 mg | ORAL_TABLET | Freq: Every day | ORAL | Status: DC

## 2013-03-03 MED ORDER — ESTROGENS CONJUGATED 0.625 MG PO TABS: ORAL_TABLET | ORAL | Status: DC

## 2013-03-03 NOTE — Patient Instructions (Addendum)
Don't forget a flu shot this fall  You need a pneumonia vaccine when you are ready (one shot one time) Make sure to see a dermatologist for f/u  We will do a referral to GI at check out  Take care of yourself and keep working on exercise Labs today

## 2013-03-03 NOTE — Assessment & Plan Note (Signed)
Ongoing somewhat severe and persistent symptoms -interfering with sleep/ also causing throat pain/ hoarseness and cough Not controlled on prilosec 40 Ref to GI

## 2013-03-03 NOTE — Assessment & Plan Note (Signed)
bp in fair control at this time  No changes needed  Disc lifstyle change with low sodium diet and exercise   Labs ordered  

## 2013-03-03 NOTE — Assessment & Plan Note (Signed)
Reviewed health habits including diet and exercise and skin cancer prevention Also reviewed health mt list, fam hx and immunizations  See HPI Lab today

## 2013-03-03 NOTE — Progress Notes (Signed)
Subjective:    Patient ID: Joann Buchanan, female    DOB: [birth year], 67 y.o.   MRN: [id number]  HPI I have personally reviewed the Medicare Annual Wellness questionnaire and have noted 1. The patient's medical and social history 2. Their use of alcohol, tobacco or illicit drugs 3. Their current medications and supplements 4. The patient's functional ability including ADL's, fall risks, home safety risks and hearing or visual             impairment. 5. Diet and physical activities 6. Evidence for depression or mood disorders  The patients weight, height, BMI have been recorded in the chart and visual acuity is per eye clinic.  I have made referrals, counseling and provided education to the patient based review of the above and I have provided the pt with a written personalized care plan for preventive services.  Has been doing ok for the most part  She wakes up 1-2 times per week with a headache - takes BC or imitrex Acid reflux - still a problem / coughs at night/ throat burns  Even on Prilosec 40 -- does watch her diet also , she has also had a swallowing issue once or twice   Has some bad night time symptoms with poor sleep     See scanned forms.  Routine anticipatory guidance given to patient.  See health maintenance. Flu-did not get vaccine last fall , will get one at her church Shingles vaccine 9/10  PNA -pt declines today  Tetanus imm 2005 Colon cancer screen-has not had a colonoscopy and does not want one and she is not interested in colon cancer screen Breast cancer screening 6/14 mammogram-nl per pt  No lumps on self exam Has a family hx of breast cancer  Advance directive-has a living will  Cognitive function addressed- see scanned forms- and if abnormal then additional documentation follows.  No memory concerns   Falls-none   Mood - is good for the most part   Is still on premarin daily  She wants to stay on it  Severe menopausal symptoms if she comes off  of it - intolerable hot flashes and mood disorder - very poor quality of life without it - knows the risks    PMH and SH reviewed  Meds, vitals, and allergies reviewed.   ROS: See HPI.  Otherwise negative.    Due for labs -will do that today   bp is stable today  No cp or palpitations or headaches or edema  No side effects to medicines  BP Readings from Last 3 Encounters:  03/03/13 136/88  03/04/12 118/68  01/14/12 120/80     She eats a healthy diet Needs to get more exercise  Plans to sign up for the silver sneakers program  She has an elliptical machine at home   Has not had derm f/u    Wt is up 3 lb with bmi of 26  dexa nl 07  Patient Active Problem List   Diagnosis Date Noted  . Encounter for Medicare annual wellness exam 03/03/2013  . FOLLICULITIS 06/14/2010  . OSTEOARTHRITIS, HANDS, BILATERAL 11/01/2009  . Menopausal symptoms 03/23/2009  . MELANOMA, MALIGNANT, TRUNK 02/04/2007  . THYROMEGALY 02/04/2007  . CONVERSION DISORDER 02/04/2007  . MIGRAINE HEADACHE 02/04/2007  . HYPERTENSION 02/04/2007  . ALLERGIC RHINITIS 02/04/2007  . GERD 02/04/2007   Past Medical History  Diagnosis Date  . Allergic rhinitis   . GERD (gastroesophageal reflux disease)   . HTN (hypertension)   .  Melanoma   . Migraine   . Hx of fibrocystic disease of breast   . Osteoarthritis of hand   . Goiter     ?    Past Surgical History  Procedure Laterality Date  . Appendectomy    . Partial hysterectomy      Ovaries intact  . Tubal ligation    . Esophagogastroduodenoscopy      normal-24 hour PH probe (-)  . Emg      left upper arm- neg  . Dexa  7/07    negative  . Eye surgery  10/09    Left eye; for double vision  . Breast biopsy      normal   History  Substance Use Topics  . Smoking status: Never Smoker   . Smokeless tobacco: Not on file  . Alcohol Use: No   Family History  Problem Relation Age of Onset  . Breast cancer      strong family history   Allergies   Allergen Reactions  . Sulfonamide Derivatives     REACTION: unspecified   Current Outpatient Prescriptions on File Prior to Visit  Medication Sig Dispense Refill  . acetaminophen (TYLENOL) 650 MG CR tablet Take 650 mg by mouth every 8 (eight) hours as needed.      . mometasone (ELOCON) 0.1 % cream APPLY TO AFFECTED AREA ONCE DAILY AS NEEDED  15 g  1  . ALPRAZolam (XANAX) 0.25 MG tablet Take 0.25 mg by mouth as needed. Prior to flights       No current facility-administered medications on file prior to visit.    Review of Systems Review of Systems  Constitutional: Negative for fever, appetite change, fatigue and unexpected weight change.  Eyes: Negative for pain and visual disturbance.  Respiratory: Negative for shortness of breath pos for night time cough (presume from gerd).   Cardiovascular: Negative for cp or palpitations    Gastrointestinal: Negative for nausea, diarrhea and constipation. pos for acid reflux symptoms with sore throat and sleep interruption Genitourinary: Negative for urgency and frequency.  Skin: Negative for pallor or rash   Neurological: Negative for weakness, Buchanan-headedness, numbness and pos for headaches/ migraine  Hematological: Negative for adenopathy. Does not bruise/bleed easily.  Psychiatric/Behavioral: Negative for dysphoric mood. The patient is not nervous/anxious.         Objective:   Physical Exam  Constitutional: She appears well-developed and well-nourished. No distress.  HENT:  Head: Normocephalic and atraumatic.  Right Ear: External ear normal.  Left Ear: External ear normal.  Nose: Nose normal.  Mouth/Throat: Oropharynx is clear and moist.  Eyes: Conjunctivae and EOM are normal. Pupils are equal, round, and reactive to Buchanan. Right eye exhibits no discharge. Left eye exhibits no discharge. No scleral icterus.  Neck: Normal range of motion. Neck supple. No JVD present. Carotid bruit is not present. No thyromegaly present.   Cardiovascular: Normal rate, regular rhythm, normal heart sounds and intact distal pulses.  Exam reveals no gallop.   Pulmonary/Chest: Effort normal and breath sounds normal. No respiratory distress. She has no wheezes. She has no rales. She exhibits no tenderness.  Abdominal: Soft. Bowel sounds are normal. She exhibits no distension, no abdominal bruit and no mass. There is no tenderness.  Genitourinary: No breast swelling, tenderness, discharge or bleeding.  Breast exam: No mass, nodules, thickening, tenderness, bulging, retraction, inflamation, nipple discharge or skin changes noted.  No axillary or clavicular LA.  Chaperoned exam.    Musculoskeletal: She exhibits no edema and  no tenderness.  Lymphadenopathy:    She has no cervical adenopathy.  Neurological: She is alert. She has normal reflexes. No cranial nerve deficit. She exhibits normal muscle tone. Coordination normal.  Skin: Skin is warm and dry. No rash noted. No erythema. No pallor.  Solar lentigos diffusely   Psychiatric: She has a normal mood and affect.          Assessment & Plan:

## 2013-03-03 NOTE — Assessment & Plan Note (Signed)
Pros /cons and risks of HRT discussed (incl breast cancer and blood clots) Pt chooses to stay on it despite risks due to severe/intolerable menopausal symptoms off of it

## 2013-03-04 ENCOUNTER — Encounter: Payer: Self-pay | Admitting: *Deleted

## 2013-03-10 ENCOUNTER — Encounter: Payer: Self-pay | Admitting: Gastroenterology

## 2013-03-20 ENCOUNTER — Encounter: Payer: Self-pay | Admitting: Gastroenterology

## 2013-03-20 ENCOUNTER — Ambulatory Visit (INDEPENDENT_AMBULATORY_CARE_PROVIDER_SITE_OTHER): Payer: Medicare Other | Admitting: Gastroenterology

## 2013-03-20 VITALS — BP 100/70 | HR 74 | Ht 60.0 in | Wt 133.1 lb

## 2013-03-20 DIAGNOSIS — R1310 Dysphagia, unspecified: Secondary | ICD-10-CM

## 2013-03-20 DIAGNOSIS — K219 Gastro-esophageal reflux disease without esophagitis: Secondary | ICD-10-CM

## 2013-03-20 MED ORDER — DEXLANSOPRAZOLE 60 MG PO CPDR: 60.0000 mg | DELAYED_RELEASE_CAPSULE | Freq: Every day | ORAL | Status: DC

## 2013-03-20 NOTE — Progress Notes (Signed)
History of Present Illness:  This is a 67 year old Caucasian female with many years of acid reflux.  I evaluated her in 2001 at which time she had a normal endoscopy and esophageal manometry.  She's been on PPI therapy for many years and now presents with worsening acid reflux described as a burning substernal chest pain with regurgitation during the day and night.  She recently has had some intermittent episodes of solid food dysphagia.  There is no history of hepatobiliary or lower GI illnesses.  She's not had previous colonoscopy or barium studies.  She has regular bowel movements without melena or hematochezia.  There is been no anorexia, weight loss, or systemic complaints. She is on daily omeprazole 40 mg.  There is no history of alcohol cigarette or NSAID abuse.  I have reviewed this patient's present history, medical and surgical past history, allergies and medications.     ROS:   All systems were reviewed and are negative unless otherwise stated in the HPI.  No history of Raynaud's phenomenon or collagen vascular disease.  She denies any cardiovascular pulmonary complaints is no history of sleep apnea or asthma.    Physical Exam: Blood pressure over 70, pulse 74 and regular weight 133 with BMI of 25. General well developed well nourished patient in no acute distress, appearing their stated age Eyes PERRLA, no icterus, fundoscopic exam per opthamologist Skin no lesions noted Neck supple, no adenopathy, no thyroid enlargement, no tenderness Chest clear to percussion and auscultation Heart no significant murmurs, gallops or rubs noted Abdomen no hepatosplenomegaly masses or tenderness, BS normal.  Extremities no acute joint lesions, edema, phlebitis or evidence of cellulitis. Neurologic patient oriented x 3, cranial nerves intact, no focal neurologic deficits noted. Psychological mental status normal and normal affect.  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  I have explained her reflux regime to the patient, she saw the patient GERD education video, have scheduled endoscopy with esophageal dilatation.  I changed her from Prilosec to Dexilant 60 mg every morning.  After her upper GI symptoms her address, we will discuss colonoscopy further.  She is very reluctant to undergo this procedure today until after her primary concerns are addressed.

## 2013-03-20 NOTE — Patient Instructions (Addendum)
  You have been scheduled for an endoscopy with propofol. Please follow written instructions given to you at your visit today. If you use inhalers (even only as needed), please bring them with you on the day of your procedure. Your physician has requested that you go to www.startemmi.com and enter the access code given to you at your visit today. This web site gives a general overview about your procedure. However, you should still follow specific instructions given to you by our office regarding your preparation for the procedure.  You watched a movie today on acid reflux and information was provided for you to review.  Please stop Omeprazole and start Dexilant Take Dexilant 60 mg one capsule by mouth once daily  ________________________________________________________________________________________________________________________________                                               We are excited to introduce MyChart, a new best-in-class service that provides you online access to important information in your electronic medical record. We want to make it easier for you to view your health information - all in one secure location - when and where you need it. We expect MyChart will enhance the quality of care and service we provide.  When you register for MyChart, you can:    View your test results.    Request appointments and receive appointment reminders via email.    Request medication renewals.    View your medical history, allergies, medications and immunizations.    Communicate with your physician's office through a password-protected site.    Conveniently print information such as your medication lists.  To find out if MyChart is right for you, please talk to a member of our clinical staff today. We will gladly answer your questions about this free health and wellness tool.  If you are age 65 or older and want a member of your family to have access to your record, you must  provide written consent by completing a proxy form available at our office. Please speak to our clinical staff about guidelines regarding accounts for patients younger than age 30.  As you activate your MyChart account and need any technical assistance, please call the MyChart technical support line at [phone]-[phone]) or email your question to [email or web address]. If you email your question(s), please include your name, a return phone number and the best time to reach you.  If you have non-urgent health-related questions, you can send a message to our office through MyChart at [email or web address]. If you have a medical emergency, call 911.  Thank you for using MyChart as your new health and wellness resource!   MyChart licensed from [employer],  9562-1308. Patents Pending.

## 2013-03-23 ENCOUNTER — Other Ambulatory Visit: Payer: Self-pay | Admitting: Family Medicine

## 2013-03-23 NOTE — Telephone Encounter (Signed)
If his note says it was stopped - deny it , thanks

## 2013-03-23 NOTE — Telephone Encounter (Signed)
Denied, med was changed to dexilant at [facility]

## 2013-03-23 NOTE — Telephone Encounter (Signed)
Received refill request electronically from pharmacy.  Medication no longer on med sheet.  See office notes from Dr. Norval Gable on 03/20/13 which medication was stopped. Okay to deny?

## 2013-04-07 ENCOUNTER — Encounter: Payer: Self-pay | Admitting: Gastroenterology

## 2013-04-07 ENCOUNTER — Ambulatory Visit (AMBULATORY_SURGERY_CENTER): Payer: Medicare Other | Admitting: Gastroenterology

## 2013-04-07 VITALS — BP 159/88 | HR 95 | Temp 97.2°F | Resp 19 | Ht 60.0 in | Wt 133.0 lb

## 2013-04-07 DIAGNOSIS — K219 Gastro-esophageal reflux disease without esophagitis: Secondary | ICD-10-CM

## 2013-04-07 DIAGNOSIS — K222 Esophageal obstruction: Secondary | ICD-10-CM

## 2013-04-07 DIAGNOSIS — R1310 Dysphagia, unspecified: Secondary | ICD-10-CM

## 2013-04-07 MED ORDER — SODIUM CHLORIDE 0.9 % IV SOLN: 500.0000 mL | INTRAVENOUS | Status: DC

## 2013-04-07 NOTE — Progress Notes (Signed)
Patient appointment schedule tomorrow for follow up on elevated blood pressure. C/o of mild tightness to chest. Patient was dilatated doing procedure. Denies numbness or tingling to shoulder or arm. Skin warm and dry. Finger stick 90. Alert. Instructed to go to emergency room if headaches continues.

## 2013-04-07 NOTE — Progress Notes (Signed)
Pt. Arrived to endoscopy admission with migraine headache.  Dr Jarold Motto and Tyrone Sage CRNA advised.  Pt. Used her Imitrex nasal spray prior to procedure.

## 2013-04-07 NOTE — Progress Notes (Signed)
Report to pacu rn, vss, bbs=clear alert and oriented, dry cough

## 2013-04-07 NOTE — Progress Notes (Signed)
Last blood pressure 159/88 Patient refuses to go to the emergency room.  States want to go home at take blood pressure medication.  Instructed again to go to ER if symptoms worsen. Skin warm and dry. No further episodes of N/V while in recovery. Color pink.

## 2013-04-07 NOTE — Patient Instructions (Addendum)
discharge instructions given with verbal understanding. Handout on a dilatation diet. Blood pressure elevated. Dr. Jarold Motto aware of elevated blood pressure. Dr. Jarold Motto instructed patient if headaches continues to go to the ER latter.  O2 started at 2/l via nasal canula. YOU HAD AN ENDOSCOPIC PROCEDURE TODAY AT THE [facility]: Refer to the procedure report that was given to you for any specific questions about what was found during the examination.  If the procedure report does not answer your questions, please call your gastroenterologist to clarify.  If you requested that your care partner not be given the details of your procedure findings, then the procedure report has been included in a sealed envelope for you to review at your convenience later.  YOU SHOULD EXPECT: Some feelings of bloating in the abdomen. Passage of more gas than usual.  Walking can help get rid of the air that was put into your GI tract during the procedure and reduce the bloating. If you had a lower endoscopy (such as a colonoscopy or flexible sigmoidoscopy) you may notice spotting of blood in your stool or on the toilet paper. If you underwent a bowel prep for your procedure, then you may not have a normal bowel movement for a few days.  DIET: Your first meal following the procedure should be a light meal and then it is ok to progress to your normal diet.  A half-sandwich or bowl of soup is an example of a good first meal.  Heavy or fried foods are harder to digest and may make you feel nauseous or bloated.  Likewise meals heavy in dairy and vegetables can cause extra gas to form and this can also increase the bloating.  Drink plenty of fluids but you should avoid alcoholic beverages for 24 hours.  ACTIVITY: Your care partner should take you home directly after the procedure.  You should plan to take it easy, moving slowly for the rest of the day.  You can resume normal activity the day after the procedure  however you should NOT DRIVE or use heavy machinery for 24 hours (because of the sedation medicines used during the test).    SYMPTOMS TO REPORT IMMEDIATELY: A gastroenterologist can be reached at any hour.  During normal business hours, 8:30 AM to 5:00 PM Monday through Friday, call [phone].  After hours and on weekends, please call the GI answering service at [phone] who will take a message and have the physician on call contact you.   Following upper endoscopy (EGD)  Vomiting of blood or coffee ground material  New chest pain or pain under the shoulder blades  Painful or persistently difficult swallowing  New shortness of breath  Fever of 100F or higher  Black, tarry-looking stools  FOLLOW UP: If any biopsies were taken you will be contacted by phone or by letter within the next 1-3 weeks.  Call your gastroenterologist if you have not heard about the biopsies in 3 weeks.  Our staff will call the home number listed on your records the next business day following your procedure to check on you and address any questions or concerns that you may have at that time regarding the information given to you following your procedure. This is a courtesy call and so if there is no answer at the home number and we have not heard from you through the emergency physician on call, we will assume that you have returned to your regular daily activities without incident.  SIGNATURES/CONFIDENTIALITY: You  and/or your care partner have signed paperwork which will be entered into your electronic medical record.  These signatures attest to the fact that that the information above on your After Visit Summary has been reviewed and is understood.  Full responsibility of the confidentiality of this discharge information lies with you and/or your care-partner.

## 2013-04-07 NOTE — Progress Notes (Signed)
Called to room to assist during endoscopic procedure.  Patient ID and intended procedure confirmed with present staff. Received instructions for my participation in the procedure from the performing physician.  

## 2013-04-07 NOTE — Op Note (Signed)
[facility] [address] [place], [address]   ENDOSCOPY PROCEDURE REPORT  PATIENT: Joann, Buchanan  MR#: [id number] BIRTHDATE: 1946/04/08 , 67  yrs. old GENDER: Female ENDOSCOPIST:David Hale Bogus, MD, Clementeen Graham REFERRED BY: Judy Pimple, M.D. PROCEDURE DATE:  04/07/2013 PROCEDURE:   EGD, diagnostic and Maloney dilation of esophagus ASA CLASS:    Class II INDICATIONS: Chest pain, Heartburn, and Dysphagia. MEDICATION: Propofol (Diprivan) 140 mg IV TOPICAL ANESTHETIC:   Cetacaine Spray  DESCRIPTION OF PROCEDURE:   After the risks and benefits of the procedure were explained, informed consent was obtained.  The LB [id number]  endoscope was introduced through the mouth  and advanced to the second portion of the duodenum .  The instrument was slowly withdrawn as the mucosa was fully examined.      DUODENUM: The duodenal mucosa showed no abnormalities in the bulb and second portion of the duodenum.  STOMACH: The mucosa of the stomach appeared normal.  ESOPHAGUS: A stricture was found at the gastroesophageal junction. Small hiatial hernia and benign stricture dilated #22F Maloney dilator...no heme or pain.No esophagitis noted.   Retroflexed views revealed a hiatal hernia.    The scope was then withdrawn from the patient and the procedure completed.  COMPLICATIONS: There were no complications.   ENDOSCOPIC IMPRESSION: 1.   The duodenal mucosa showed no abnormalities in the bulb and second portion of the duodenum 2.   The mucosa of the stomach appeared normal 3.   Stricture was found at the gastroesophageal junction ...chronic GERD.  RECOMMENDATIONS: Continue current medications.Marland KitchenMarland KitchenSee primary care per hypertension and headaches....    _______________________________ eSignedMardella Layman, MD, [facility] 04/07/2013 3:56 PM   standard discharge

## 2013-04-08 ENCOUNTER — Encounter: Payer: Self-pay | Admitting: Family Medicine

## 2013-04-08 ENCOUNTER — Telehealth: Payer: Self-pay | Admitting: *Deleted

## 2013-04-08 ENCOUNTER — Ambulatory Visit (INDEPENDENT_AMBULATORY_CARE_PROVIDER_SITE_OTHER): Payer: Medicare Other | Admitting: Family Medicine

## 2013-04-08 VITALS — BP 126/68 | HR 89 | Temp 98.7°F | Ht 60.0 in | Wt 131.2 lb

## 2013-04-08 DIAGNOSIS — I10 Essential (primary) hypertension: Secondary | ICD-10-CM

## 2013-04-08 DIAGNOSIS — G43909 Migraine, unspecified, not intractable, without status migrainosus: Secondary | ICD-10-CM

## 2013-04-08 MED ORDER — CYCLOBENZAPRINE HCL 10 MG PO TABS: 10.0000 mg | ORAL_TABLET | Freq: Every evening | ORAL | Status: DC | PRN

## 2013-04-08 NOTE — Assessment & Plan Note (Signed)
Worse- daily in the past 2 mo- wakes up with daily  Disc poss triggers and lifestyle She is on HRT- but does not have an aura No imp with beta blocker or effexor in the past  Given age- a bit more worrisome Non focal exam Ref to neuro

## 2013-04-08 NOTE — Progress Notes (Signed)
Subjective:    Patient ID: Joann Buchanan, female    DOB: 04-Sep-1945, 67 y.o.   MRN: [id number]  HPI Here for f/u of HTN   Pt was seen by GI yesterday for GERD/ EGD and bp was high  Got up to 170s/100s - in recovery  Had a migraine right before she left   Last mo has been waking up with ha in the back of her head  Can also move to the front   Also concern over ha  Wakes up with headache every day since august  Drinking less caffeine  Does not sleep well - get 3-4 hours per night - is normal for her - and does not feel tired from that  No change in medicines Nothing is different  She has had migraines for many years  Thinks she saw neurology in the past -at [facility]- and was not helpful  Has been on beta blockers in the past - did not help  Then was on ? effexor - and the side effects were too bad- dry mouth   No particular triggers  Yesterday - was fasting   Today has a mild headache    BP Readings from Last 3 Encounters:  04/08/13 126/68  04/07/13 159/88  03/20/13 100/70    Is on low dose ace   Wt is stable with bmi of 25  Nl chem panel last mo   Chemistry      Component Value Date/Time   NA 138 03/03/2013 1227   K 4.2 03/03/2013 1227   CL 106 03/03/2013 1227   CO2 26 03/03/2013 1227   BUN 14 03/03/2013 1227   CREATININE 0.8 03/03/2013 1227      Component Value Date/Time   CALCIUM 9.7 03/03/2013 1227   ALKPHOS 62 03/03/2013 1227   AST 16 03/03/2013 1227   ALT 13 03/03/2013 1227   BILITOT 0.4 03/03/2013 1227      Patient Active Problem List   Diagnosis Date Noted  . Encounter for Medicare annual wellness exam 03/03/2013  . FOLLICULITIS 06/14/2010  . OSTEOARTHRITIS, HANDS, BILATERAL 11/01/2009  . Menopausal symptoms 03/23/2009  . MELANOMA, MALIGNANT, TRUNK 02/04/2007  . THYROMEGALY 02/04/2007  . MIGRAINE HEADACHE 02/04/2007  . HYPERTENSION 02/04/2007  . ALLERGIC RHINITIS 02/04/2007  . GERD 02/04/2007   Past Medical History  Diagnosis Date  . Allergic  rhinitis   . GERD (gastroesophageal reflux disease)   . HTN (hypertension)   . Melanoma   . Migraine   . Hx of fibrocystic disease of breast   . Osteoarthritis of hand   . Goiter     ?    Past Surgical History  Procedure Laterality Date  . Appendectomy    . Partial hysterectomy      Ovaries intact  . Tubal ligation    . Esophagogastroduodenoscopy      normal-24 hour PH probe (-)  . Emg      left upper arm- neg  . Dexa  7/07    negative  . Eye surgery  10/09    Left eye; for double vision  . Breast biopsy      normal   History  Substance Use Topics  . Smoking status: Never Smoker   . Smokeless tobacco: Never Used  . Alcohol Use: No   Family History  Problem Relation Age of Onset  . Breast cancer      strong family history  . Breast cancer Mother    Allergies  Allergen Reactions  .  Sulfonamide Derivatives     REACTION: unspecified   Current Outpatient Prescriptions on File Prior to Visit  Medication Sig Dispense Refill  . acetaminophen (TYLENOL) 650 MG CR tablet Take 650 mg by mouth every 8 (eight) hours as needed.      . ALPRAZolam (XANAX) 0.25 MG tablet Take 0.25 mg by mouth as needed. Prior to flights      . dexlansoprazole (DEXILANT) 60 MG capsule Take 1 capsule (60 mg total) by mouth daily.  90 capsule  3  . estrogens, conjugated, (PREMARIN) 0.625 MG tablet Take one pill by mouth daily  90 tablet  3  . lisinopril (PRINIVIL,ZESTRIL) 2.5 MG tablet Take 1 tablet (2.5 mg total) by mouth daily.  90 tablet  3  . mometasone (ELOCON) 0.1 % cream APPLY TO AFFECTED AREA ONCE DAILY AS NEEDED  15 g  1  . SUMAtriptan (IMITREX) 20 MG/ACT nasal spray Place 1 spray (20 mg total) into the nose every 2 (two) hours as needed for migraine. No more than 2 doses in 24 hours  6 Inhaler  3   No current facility-administered medications on file prior to visit.     Review of Systems Review of Systems  Constitutional: Negative for fever, appetite change,  and unexpected weight  change. pos for fatigue  Eyes: Negative for pain and visual disturbance.  Respiratory: Negative for cough and shortness of breath.   Cardiovascular: Negative for cp or palpitations    Gastrointestinal: Negative for nausea, diarrhea and constipation.  Genitourinary: Negative for urgency and frequency.  Skin: Negative for pallor or rash   Neurological: Negative for weakness, Buchanan-headedness, numbness and pos for ha  Hematological: Negative for adenopathy. Does not bruise/bleed easily.  Psychiatric/Behavioral: Negative for dysphoric mood. The patient is not nervous/anxious.         Objective:   Physical Exam  Constitutional: She is oriented to person, place, and time. She appears well-developed and well-nourished. No distress.  HENT:  Head: Normocephalic and atraumatic.  Mouth/Throat: Oropharynx is clear and moist.  Eyes: Conjunctivae and EOM are normal. Pupils are equal, round, and reactive to Buchanan. No scleral icterus.  Neck: Normal range of motion. Neck supple. No JVD present. Carotid bruit is not present. No thyromegaly present.  Cardiovascular: Normal rate, regular rhythm and normal heart sounds.  Exam reveals no gallop.   Pulmonary/Chest: Effort normal and breath sounds normal. No respiratory distress. She has no wheezes. She has no rales.  Abdominal: Soft. Bowel sounds are normal. She exhibits no abdominal bruit.  Musculoskeletal: She exhibits no edema and no tenderness.  Lymphadenopathy:    She has no cervical adenopathy.  Neurological: She is alert and oriented to person, place, and time. She has normal reflexes. She displays no atrophy and no tremor. No cranial nerve deficit. She exhibits normal muscle tone. Coordination and gait normal.  No focal cerebellar signs   Skin: Skin is warm and dry. No rash noted. No erythema. No pallor.  Psychiatric: She has a normal mood and affect.          Assessment & Plan:

## 2013-04-08 NOTE — Assessment & Plan Note (Signed)
bp is better today Suspect up yesterday in response to a headache Will continue to watch closely

## 2013-04-08 NOTE — Telephone Encounter (Signed)
  Follow up Call-  Call back number 04/07/2013  Post procedure Call Back phone  # [phone]  Permission to leave phone message Yes     Patient questions:  Do you have a fever, pain , or abdominal swelling? no Pain Score  0 *  Have you tolerated food without any problems? yes  Have you been able to return to your normal activities? no  Do you have any questions about your discharge instructions: Diet   no Medications  no Follow up visit  no  Do you have questions or concerns about your Care? no  Actions: * If pain score is 4 or above: No action needed, pain <4.  spoke with husband, who stating patient's headache is improved, still some chest tightness. Patient is going to Dr. Royden Purl office at 11:15. No other complaints. Discussed with husband to call 911 if headache or chest pain worsens prior to that time. Husband verbalized understanding and agreement.

## 2013-04-08 NOTE — Patient Instructions (Addendum)
Drink lots of water Avoid caffeine  Try flexeril at night - muscle relaxer  Stop up front for neurology referral appt

## 2013-04-10 ENCOUNTER — Encounter: Payer: Self-pay | Admitting: Neurology

## 2013-04-10 ENCOUNTER — Ambulatory Visit (INDEPENDENT_AMBULATORY_CARE_PROVIDER_SITE_OTHER): Payer: Medicare Other | Admitting: Neurology

## 2013-04-10 VITALS — BP 126/84 | HR 60 | Temp 97.9°F | Ht 60.0 in | Wt 131.0 lb

## 2013-04-10 DIAGNOSIS — M5481 Occipital neuralgia: Secondary | ICD-10-CM

## 2013-04-10 DIAGNOSIS — M531 Cervicobrachial syndrome: Secondary | ICD-10-CM

## 2013-04-10 DIAGNOSIS — G43909 Migraine, unspecified, not intractable, without status migrainosus: Secondary | ICD-10-CM

## 2013-04-10 NOTE — Procedures (Signed)
Procedure: Occipital nerve block  Procedure risks, benefits and alternative treatments explained to patient and they agree to proceed.   A concentration of 0.25% bupivacaine (3 ml) was mixed with 40 mg of Kenalog (1 ml). A 27 gauge needle was used for injection. The region of the left greater occipital nerve was located by palpation. The area was prepped with alcohol. A total of 2.5 cc of the above mixture was injected without difficulty. The patient tolerated the procedure well.     Bela Bonaparte K. Allena Katz, DO

## 2013-04-10 NOTE — Progress Notes (Signed)
[facility] Neurology Division Clinic Note - Initial Visit   Date: 04/10/2013    Joann Buchanan MRN: [id number] DOB: [birth year]   Dear Dr Milinda Antis:  Thank you for your kind referral of Joann Buchanan for consultation of migraines. Although her history is well known to you, please allow [place] to reiterate it for the purpose of our medical record. The patient was accompanied to the clinic by self.   History of Present Illness: Joann Buchanan is a 67 y.o. year-old right-handed Caucasian female with history of GERD, HTN, and migraines presenting for evaluation of headaches.  Migraines started in her late 3s and stopped in her 24s.  She was previously on propranolol and antidepressant, but she did not tolerate it. They started again intermittently over the past 10-15 years and have become worse over the past two months. Headaches are located over center of the forehead, described pounding, and radiates towards the neck. It usually starts in the afternoon and if she is able to sleep it off, will be better by the morning.  If she unable to sleep, then she is unable to focus and feels like a "hangover".  Frequency is twice per month.  She has noticed that onions trigger her symptoms.  There is associated photophobia, phonophobia, nausea, and vomiting. She has visual auras after the headache, described wavy lines and blurry vision and there is occasional tingling over the left side of her face.  She takes imitrex nasal spray and takes it twice per month which works.  She takes motrin or Aleve 3-4 times per week and used flexiril for the past two nights which has helped.   Around the end of July, she developed a new headache at the base of her headache, characterized as pressure-like stinging sensation, as if her hair is being pulled.  This headache occurs in the morning and has been constant.  Denies any alleviating or exacerbating factors.  She denies any neck pain.      Past Medical  History  Diagnosis Date  . Allergic rhinitis   . GERD (gastroesophageal reflux disease)   . HTN (hypertension)   . Melanoma   . Migraine   . Hx of fibrocystic disease of breast   . Osteoarthritis of hand   . Goiter     ?     Past Surgical History  Procedure Laterality Date  . Appendectomy    . Partial hysterectomy      Ovaries intact  . Tubal ligation    . Esophagogastroduodenoscopy      normal-24 hour PH probe (-)  . Emg      left upper arm- neg  . Dexa  7/07    negative  . Eye surgery  10/09    Left eye; for double vision  . Breast biopsy      normal     Medications:  Current Outpatient Prescriptions on File Prior to Visit  Medication Sig Dispense Refill  . acetaminophen (TYLENOL) 650 MG CR tablet Take 650 mg by mouth every 8 (eight) hours as needed.      . ALPRAZolam (XANAX) 0.25 MG tablet Take 0.25 mg by mouth as needed. Prior to flights      . cyclobenzaprine (FLEXERIL) 10 MG tablet Take 1 tablet (10 mg total) by mouth at bedtime as needed for muscle spasms.  30 tablet  0  . dexlansoprazole (DEXILANT) 60 MG capsule Take 1 capsule (60 mg total) by mouth daily.  90 capsule  3  .  estrogens, conjugated, (PREMARIN) 0.625 MG tablet Take one pill by mouth daily  90 tablet  3  . lisinopril (PRINIVIL,ZESTRIL) 2.5 MG tablet Take 1 tablet (2.5 mg total) by mouth daily.  90 tablet  3  . mometasone (ELOCON) 0.1 % cream APPLY TO AFFECTED AREA ONCE DAILY AS NEEDED  15 g  1  . SUMAtriptan (IMITREX) 20 MG/ACT nasal spray Place 1 spray (20 mg total) into the nose every 2 (two) hours as needed for migraine. No more than 2 doses in 24 hours  6 Inhaler  3   No current facility-administered medications on file prior to visit.    Allergies:  Allergies  Allergen Reactions  . Sulfonamide Derivatives     REACTION: unspecified    Family History: Family History  Problem Relation Age of Onset  . Other Father     Died, 49 - tree fell on him  . Breast cancer Mother     Died, 40  .  Breast cancer Sister     Died, 71  . Heart disease Sister     Died, 46  . Healthy Son   . Healthy Daughter     Social History: History   Social History  . Marital Status: Married    Spouse Name: N/A    Number of Children: 2  . Years of Education: N/A   Occupational History  . Retired [employer]   Social History Main Topics  . Smoking status: Never Smoker   . Smokeless tobacco: Never Used  . Alcohol Use: No  . Drug Use: No  . Sexual Activity: Not on file   Other Topics Concern  . Not on file   Social History Narrative   Lives with husband.  Retired Naval architect for [employer]   Married; 2 children; 3 grand children          Review of Systems:  CONSTITUTIONAL: No fevers, chills, night sweats, or weight loss.   EYES: No visual changes or eye pain ENT: No hearing changes.  No history of nose bleeds.   RESPIRATORY: No cough, wheezing and shortness of breath.   CARDIOVASCULAR: Negative for chest pain, and palpitations.   GI: Negative for abdominal discomfort, blood in stools or black stools.  No recent change in bowel habits.   GU:  No history of incontinence.   MUSCLOSKELETAL: No history of joint pain or swelling.  No myalgias.   SKIN: Negative for lesions, rash, and itching.   HEMATOLOGY/ONCOLOGY: Negative for prolonged bleeding, bruising easily, and swollen nodes. ENDOCRINE: Negative for cold or heat intolerance, polydipsia or goiter.   PSYCH:  No depression or anxiety symptoms.   NEURO: As Above.   Vital Signs:  BP 126/84  Pulse 60  Temp(Src) 97.9 F (36.6 C)  Ht 5' (1.524 m)  Wt 131 lb (59.421 kg)  BMI 25.58 kg/m2 Pain Scale: 2 on a scale of 0-10   General Medical Exam:   General:  Well appearing, comfortable.   Eyes/ENT: see cranial nerve examination.   Neck: No masses appreciated.  Full range of motion without tenderness.  No carotid bruits.  Point tenderness over the great occipital nerve on the right Respiratory:  Clear to  auscultation, good air entry bilaterally.   Cardiac:  Regular rate and rhythm, no murmur.   GI:  Soft, non-tender, non-distended abdomen.  Bowel sounds normal. No masses, organomegaly.    Extremities:  No deformities, edema, or skin discoloration. Good capillary refill.   Skin:  Skin color,  texture, turgor normal. No rashes or lesions.  Neurological Exam: MENTAL STATUS including orientation to time, place, person, recent and remote memory, attention span and concentration, language, and fund of knowledge is normal.  Speech is not dysarthric.  CRANIAL NERVES: II:  No visual field defects.  Unremarkable fundi.   III-IV-VI: Pupils equal round and reactive to light.  Normal conjugate, extra-ocular eye movements in all directions of gaze.  No nystagmus.  No ptosis prior to or post sustained upgaze.   V:  Normal facial sensation.   VII:  Normal facial symmetry and movements.   VIII:  Normal hearing and vestibular function.   IX-X:  Normal palatal movement.   XI:  Normal shoulder shrug and head rotation.   XII:  Normal tongue strength and range of motion, no deviation or fasciculation.  MOTOR:  No atrophy, fasciculations or abnormal movements.  No pronator drift.  Tone is normal.    Right Upper Extremity:    Left Upper Extremity:    Deltoid  5/5   Deltoid  5/5   Biceps  5/5   Biceps  5/5   Triceps  5/5   Triceps  5/5   Wrist extensors  5/5   Wrist extensors  5/5   Wrist flexors  5/5   Wrist flexors  5/5   Finger extensors  5/5   Finger extensors  5/5   Finger flexors  5/5   Finger flexors  5/5   Dorsal interossei  5/5   Dorsal interossei  5/5   Abductor pollicis  5/5   Abductor pollicis  5/5   Tone (Ashworth scale)  0  Tone (Ashworth scale)  0   Right Lower Extremity:    Left Lower Extremity:    Hip flexors  5/5   Hip flexors  5/5   Hip extensors  5/5   Hip extensors  5/5   Knee flexors  5/5   Knee flexors  5/5   Knee extensors  5/5   Knee extensors  5/5   Dorsiflexors  5/5    Dorsiflexors  5/5   Plantarflexors  5/5   Plantarflexors  5/5   Toe extensors  5/5   Toe extensors  5/5   Toe flexors  5/5   Toe flexors  5/5   Tone (Ashworth scale)  0  Tone (Ashworth scale)  0   MSRs:  Right                                                                 Left brachioradialis 2+  brachioradialis 2+  biceps 2+  biceps 2+  triceps 2+  triceps 2+  patellar 2+  patellar 2+  ankle jerk 2+  ankle jerk 2+  Hoffman no  Hoffman no  plantar response down  plantar response down   SENSORY:  Normal and symmetric perception of light touch, pinprick, vibration, and proprioception.  Romberg's sign absent.   COORDINATION/GAIT: Normal finger-to- nose-finger and heel-to-shin.  Intact rapid alternating movements bilaterally.  Able to rise from a chair without using arms.  Gait narrow based and stable. Tandem and stressed gait intact.   Data: none  IMPRESSION: Mrs. Ryback is a 67 year-old female with presenting for evaluation of headaches.  Her neurological examination is normal.  She has  point tenderness over the left base of the occiput, which is consistent with greater occipital neuralgia.  This pain seems to be the more bothersome symptom for her because it is constant and sharp.  Her migraines occur 2x per month and are responsive to triptans and too infrequent for preventative therapy.  I discussed the options for treatment of occipital neuralgia including nerve block or trial of oral steroids to break the pain cycle.  Risks and benefits of each were discussed and nerve block was decided.  If pain persists, may consider imaging and/or sleep study.   PLAN/RECOMMENDATIONS:  1.  Left greater occipital nerve block performed today (see procedure note) 2.  Continue abortive medications - NSAIDs as needed but no more than twice per week  3.  Continue Imitrex as needed for migraines 4.  Patient to call with clinical update in one week 5.  Return to clinic as needed   The duration of this  appointment visit was 60 minutes of face-to-face time with the patient.  Greater than 50% of this time was spent in counseling, explanation of diagnosis, planning of further management, and coordination of care.   Thank you for allowing me to participate in patient's care.  If I can answer any additional questions, I would be pleased to do so.    Sincerely,    Verle Wheeling K. Allena Katz, DO

## 2013-07-10 ENCOUNTER — Telehealth: Payer: Self-pay | Admitting: *Deleted

## 2013-07-10 NOTE — Telephone Encounter (Signed)
Done and in IN box 

## 2013-07-10 NOTE — Telephone Encounter (Signed)
PA given back to [employer]

## 2013-07-10 NOTE — Telephone Encounter (Signed)
Received prior auth request. Auth paperwork obtained and placed in your inbox.   

## 2013-07-13 NOTE — Telephone Encounter (Signed)
Approval form received by insurance company. Will send to provider for signature, then to be scanned into patients medical record. Pharmacy notified via fax.  

## 2014-02-18 ENCOUNTER — Other Ambulatory Visit: Payer: Self-pay | Admitting: Family Medicine

## 2014-02-23 ENCOUNTER — Other Ambulatory Visit: Payer: Self-pay | Admitting: Family Medicine

## 2014-02-23 NOTE — Telephone Encounter (Signed)
Electronic refill request, no recent/future appt., please advise  

## 2014-02-23 NOTE — Telephone Encounter (Signed)
Please schedule f/u for about 3 months and refill until then

## 2014-02-25 NOTE — Telephone Encounter (Signed)
Left voicemail requesting pt to call office 

## 2014-03-04 NOTE — Telephone Encounter (Signed)
Left voicemail requesting pt to call office 

## 2014-03-09 NOTE — Telephone Encounter (Signed)
Pt never responded to my calls so Rx declined and pharmacy advise she needs appt

## 2014-05-10 ENCOUNTER — Telehealth: Payer: Self-pay

## 2014-05-10 NOTE — Telephone Encounter (Signed)
LVM for pt to call back.   RE: AWV for 2015 to be scheduled with PA or NP if pt allows.

## 2014-08-13 ENCOUNTER — Encounter: Payer: Self-pay | Admitting: Family Medicine

## 2014-08-18 ENCOUNTER — Encounter: Payer: Self-pay | Admitting: Family Medicine

## 2014-08-18 ENCOUNTER — Ambulatory Visit (INDEPENDENT_AMBULATORY_CARE_PROVIDER_SITE_OTHER): Payer: Medicare Other | Admitting: Family Medicine

## 2014-08-18 VITALS — BP 138/82 | HR 82 | Temp 98.0°F | Ht 60.0 in | Wt 133.8 lb

## 2014-08-18 DIAGNOSIS — J069 Acute upper respiratory infection, unspecified: Secondary | ICD-10-CM | POA: Diagnosis not present

## 2014-08-18 DIAGNOSIS — H6502 Acute serous otitis media, left ear: Secondary | ICD-10-CM | POA: Diagnosis not present

## 2014-08-18 DIAGNOSIS — H6692 Otitis media, unspecified, left ear: Secondary | ICD-10-CM | POA: Insufficient documentation

## 2014-08-18 MED ORDER — AMOXICILLIN-POT CLAVULANATE 875-125 MG PO TABS: 1.0000 | ORAL_TABLET | Freq: Two times a day (BID) | ORAL | Status: DC

## 2014-08-18 NOTE — Progress Notes (Signed)
Pre visit review using our clinic review tool, if applicable. No additional management support is needed unless otherwise documented below in the visit note. 

## 2014-08-18 NOTE — Patient Instructions (Signed)
I think you have a viral upper respiratory infection (cold) with an ear infection on the L and possible early sinus infection  Take the augmentin as directed  Drink fluids and rest  Add mucinex to help loosen congestion  Cough will get worse before it gets better, also rest your voice  Take it easy   Update if not starting to improve in a week or if worsening

## 2014-08-18 NOTE — Progress Notes (Signed)
Subjective:    Patient ID: Joann Buchanan, female    DOB: 04/25/1946, 69 y.o.   MRN: [id number]  HPI Here with ST and ear pain  Woke up with it on Sat am  Was worse on the R side of her throat  Had chills on Sat night - unusual for her/did not take her temp  Yesterday lost her voice and ears are stopped up  Sinuses are congested and she has a ha   Last night- L ear was more painful and she had fluid drainage  Popping now  Now R ear hurts worse   Otc: taking tylenol sinus  Feels congested and teeth hurt - it hurts to blow her nose Facial pain all over   Patient Active Problem List   Diagnosis Date Noted  . Encounter for Medicare annual wellness exam 03/03/2013  . FOLLICULITIS 78/58/8502  . OSTEOARTHRITIS, HANDS, BILATERAL 11/01/2009  . Menopausal symptoms 03/23/2009  . MELANOMA, MALIGNANT, TRUNK 02/04/2007  . THYROMEGALY 02/04/2007  . MIGRAINE HEADACHE 02/04/2007  . HYPERTENSION 02/04/2007  . ALLERGIC RHINITIS 02/04/2007  . GERD 02/04/2007   Past Medical History  Diagnosis Date  . Allergic rhinitis   . GERD (gastroesophageal reflux disease)   . HTN (hypertension)   . Melanoma   . Migraine   . Hx of fibrocystic disease of breast   . Osteoarthritis of hand   . Goiter     ?    Past Surgical History  Procedure Laterality Date  . Appendectomy    . Partial hysterectomy      Ovaries intact  . Tubal ligation    . Esophagogastroduodenoscopy      normal-24 hour PH probe (-)  . Emg      left upper arm- neg  . Dexa  7/07    negative  . Eye surgery  10/09    Left eye; for double vision  . Breast biopsy      normal   History  Substance Use Topics  . Smoking status: Never Smoker   . Smokeless tobacco: Never Used  . Alcohol Use: No   Family History  Problem Relation Age of Onset  . Other Father     Died, 75 - tree fell on him  . Breast cancer Mother     Died, 41  . Breast cancer Sister     Died, 77  . Heart disease Sister     Died, 22  . Healthy Son     . Healthy Daughter    Allergies  Allergen Reactions  . Sulfonamide Derivatives     REACTION: unspecified   Current Outpatient Prescriptions on File Prior to Visit  Medication Sig Dispense Refill  . acetaminophen (TYLENOL) 650 MG CR tablet Take 650 mg by mouth every 8 (eight) hours as needed.    . ALPRAZolam (XANAX) 0.25 MG tablet Take 0.25 mg by mouth as needed. Prior to flights    . cyclobenzaprine (FLEXERIL) 10 MG tablet Take 1 tablet (10 mg total) by mouth at bedtime as needed for muscle spasms. (Patient not taking: Reported on 08/18/2014) 30 tablet 0  . dexlansoprazole (DEXILANT) 60 MG capsule Take 1 capsule (60 mg total) by mouth daily. (Patient not taking: Reported on 08/18/2014) 90 capsule 3  . estrogens, conjugated, (PREMARIN) 0.625 MG tablet Take one pill by mouth daily (Patient not taking: Reported on 08/18/2014) 90 tablet 3  . lisinopril (PRINIVIL,ZESTRIL) 2.5 MG tablet Take 1 tablet (2.5 mg total) by mouth daily. (Patient not taking:  Reported on 08/18/2014) 90 tablet 3  . mometasone (ELOCON) 0.1 % cream APPLY TO AFFECTED AREA ONCE DAILY AS NEEDED (Patient not taking: Reported on 08/18/2014) 15 g 1  . SUMAtriptan (IMITREX) 20 MG/ACT nasal spray Place 1 spray (20 mg total) into the nose every 2 (two) hours as needed for migraine. No more than 2 doses in 24 hours (Patient not taking: Reported on 08/18/2014) 6 Inhaler 3  . SUMAtriptan (IMITREX) 20 MG/ACT nasal spray USE 1 SPRAY INTO THE NOSE EVERY 2 HOURS AS NEEDED FOR MIGRAINE. NO MORE THAN 2 DOSES IN 24 HOURS (Patient not taking: Reported on 08/18/2014) 1 Inhaler 1   No current facility-administered medications on file prior to visit.      Review of Systems Review of Systems  Constitutional: Negative for fever, appetite change, and unexpected weight change.  ENt pos for cong /rhinorrhea/ sinus pain and ear pain worse on L Eyes: Negative for pain and visual disturbance.  Respiratory: Negative for wheeze and shortness of breath.    Cardiovascular: Negative for cp or palpitations    Gastrointestinal: Negative for nausea, diarrhea and constipation.  Genitourinary: Negative for urgency and frequency.  Skin: Negative for pallor or rash   Neurological: Negative for weakness, light-headedness, numbness and headaches.  Hematological: Negative for adenopathy. Does not bruise/bleed easily.  Psychiatric/Behavioral: Negative for dysphoric mood. The patient is not nervous/anxious.         Objective:   Physical Exam  Constitutional: She appears well-developed and well-nourished. No distress.  HENT:  Head: Normocephalic and atraumatic.  Mouth/Throat: Oropharynx is clear and moist. No oropharyngeal exudate.  Nares are injected and congested  R TM dull, L TM erythematous with effusion  Mild maxillary sinus tenderness Throat clear   Eyes: Conjunctivae and EOM are normal. Pupils are equal, round, and reactive to light. Right eye exhibits no discharge. Left eye exhibits no discharge.  Neck: Normal range of motion. Neck supple.  Cardiovascular: Normal rate and regular rhythm.   Pulmonary/Chest: Effort normal and breath sounds normal. No respiratory distress. She has no wheezes. She has no rales.  Lymphadenopathy:    She has no cervical adenopathy.  Skin: Skin is warm and dry. No rash noted. No erythema. No pallor.  Psychiatric: She has a normal mood and affect.          Assessment & Plan:   Problem List Items Addressed This Visit      Respiratory   Viral URI    With L OM -will tx with augmentin  Disc symptomatic care - see instructions on AVS  Update if not starting to improve in a week or if worsening          Nervous and Auditory   Otitis media of left ear - Primary    With viral uri tx with augmentin Suggested tx for congestion - Disc symptomatic care - see instructions on AVS  Fluids/rest Update if not starting to improve in a week or if worsening        Relevant Medications   amoxicillin-clavulanate  (AUGMENTIN) tablet 875-125 mg

## 2014-08-19 NOTE — Assessment & Plan Note (Signed)
With viral uri tx with augmentin Suggested tx for congestion - Disc symptomatic care - see instructions on AVS  Fluids/rest Update if not starting to improve in a week or if worsening

## 2014-08-19 NOTE — Assessment & Plan Note (Signed)
With L OM -will tx with augmentin  Disc symptomatic care - see instructions on AVS  Update if not starting to improve in a week or if worsening

## 2014-08-20 ENCOUNTER — Encounter: Payer: Self-pay | Admitting: Family Medicine

## 2014-08-23 ENCOUNTER — Encounter: Payer: Self-pay | Admitting: *Deleted

## 2014-08-24 ENCOUNTER — Telehealth: Payer: Self-pay | Admitting: Family Medicine

## 2014-08-24 NOTE — Telephone Encounter (Signed)
Called patient and notified her of Dr Marliss Coots comments. Patient verbalized understanding.

## 2014-08-24 NOTE — Telephone Encounter (Signed)
Pt called office. Her ears are still stopped up, cannot hear and echos.  Her chest is tight and not coughing up anything. She finishes Augmentin tomorrow and still taking mucinex.  Is there anything else that can be called in.  [facility]

## 2014-08-24 NOTE — Telephone Encounter (Signed)
Continue mucinex If not already using a nasal steroid -get otc flonase to help with ears  It may be another week to get rid of rest of the symptoms  If no further imp or if worse please f/u

## 2014-09-06 ENCOUNTER — Telehealth: Payer: Self-pay | Admitting: Family Medicine

## 2014-09-06 MED ORDER — AMOXICILLIN-POT CLAVULANATE 875-125 MG PO TABS: 1.0000 | ORAL_TABLET | Freq: Two times a day (BID) | ORAL | Status: DC

## 2014-09-06 NOTE — Telephone Encounter (Signed)
Called and spoken to patient. She stated that she feels just as bad as weeks a ago. She still feels like she stopped up in her head. Headache and ear fullness. Mild fever. Some night sweats. No chills. No cough. Patient request another round antibiotics. Please advise. Thank you.

## 2014-09-06 NOTE — Telephone Encounter (Signed)
[facility]. Called and notifed patient of Dr Marliss Coots comment.

## 2014-09-06 NOTE — Telephone Encounter (Signed)
Please refill her augmentin  F/u if no improvement with that  Thanks

## 2014-09-06 NOTE — Telephone Encounter (Signed)
Pt called stating this is her 4th week of having a sinus infection/ear stopped up.  She wanted to know if she needs to be seen or can you call her in something else for this  [facility]

## 2015-03-17 ENCOUNTER — Encounter: Payer: Self-pay | Admitting: Family Medicine

## 2015-04-05 ENCOUNTER — Ambulatory Visit (INDEPENDENT_AMBULATORY_CARE_PROVIDER_SITE_OTHER): Payer: Medicare Other | Admitting: Family Medicine

## 2015-04-05 ENCOUNTER — Encounter: Payer: Self-pay | Admitting: Family Medicine

## 2015-04-05 VITALS — BP 142/90 | HR 80 | Temp 98.7°F | Ht 60.0 in | Wt 135.5 lb

## 2015-04-05 DIAGNOSIS — Z636 Dependent relative needing care at home: Secondary | ICD-10-CM

## 2015-04-05 DIAGNOSIS — R202 Paresthesia of skin: Secondary | ICD-10-CM | POA: Diagnosis not present

## 2015-04-05 DIAGNOSIS — I10 Essential (primary) hypertension: Secondary | ICD-10-CM | POA: Diagnosis not present

## 2015-04-05 DIAGNOSIS — G5600 Carpal tunnel syndrome, unspecified upper limb: Secondary | ICD-10-CM | POA: Insufficient documentation

## 2015-04-05 DIAGNOSIS — G43709 Chronic migraine without aura, not intractable, without status migrainosus: Secondary | ICD-10-CM

## 2015-04-05 MED ORDER — LISINOPRIL 2.5 MG PO TABS: 2.5000 mg | ORAL_TABLET | Freq: Every day | ORAL | Status: DC

## 2015-04-05 NOTE — Progress Notes (Signed)
Subjective:    Patient ID: Joann Buchanan, female    DOB: 1945-11-03, 69 y.o.   MRN: [id number]  HPI Here with a cyst on wrist and also neuro   R wrist  Her hand feels numb after working with her hand Has a knot on medial wrist  Hurts and stings- limits her activity   2014 - went to neurology for HA  Had occipital block for occipital neuralgia - unsure if it helped  Having migraines "now and then"  Every time she goes out in the sun-gets a migraine right over both eyes (wears good sunglasses)  Has a cataract - and sees opth (has appt next mo) 3-4 ha per week (used to be twice per mo)    In addition -her husband has been sick and she stopped all of her medications  Has "let herself go"  Was feeling good before   He had to have 4 bypasses  Also has copd  She has to do everything for him   Patient Active Problem List   Diagnosis Date Noted  . Otitis media of left ear 08/18/2014  . Viral URI 08/18/2014  . Encounter for Medicare annual wellness exam 03/03/2013  . FOLLICULITIS 01/06/1600  . OSTEOARTHRITIS, HANDS, BILATERAL 11/01/2009  . Menopausal symptoms 03/23/2009  . MELANOMA, MALIGNANT, TRUNK 02/04/2007  . THYROMEGALY 02/04/2007  . Migraine 02/04/2007  . HYPERTENSION 02/04/2007  . ALLERGIC RHINITIS 02/04/2007  . GERD 02/04/2007   Past Medical History  Diagnosis Date  . Allergic rhinitis   . GERD (gastroesophageal reflux disease)   . HTN (hypertension)   . Melanoma   . Migraine   . Hx of fibrocystic disease of breast   . Osteoarthritis of hand   . Goiter     ?    Past Surgical History  Procedure Laterality Date  . Appendectomy    . Partial hysterectomy      Ovaries intact  . Tubal ligation    . Esophagogastroduodenoscopy      normal-24 hour PH probe (-)  . Emg      left upper arm- neg  . Dexa  7/07    negative  . Eye surgery  10/09    Left eye; for double vision  . Breast biopsy      normal   Social History  Substance Use Topics  . Smoking  status: Never Smoker   . Smokeless tobacco: Never Used  . Alcohol Use: No   Family History  Problem Relation Age of Onset  . Other Father     Died, 57 - tree fell on him  . Breast cancer Mother     Died, 63  . Breast cancer Sister     Died, 37  . Heart disease Sister     Died, 84  . Healthy Son   . Healthy Daughter    Allergies  Allergen Reactions  . Sulfonamide Derivatives     REACTION: unspecified   Current Outpatient Prescriptions on File Prior to Visit  Medication Sig Dispense Refill  . acetaminophen (TYLENOL) 650 MG CR tablet Take 650 mg by mouth every 8 (eight) hours as needed.    . ALPRAZolam (XANAX) 0.25 MG tablet Take 0.25 mg by mouth as needed. Prior to flights    . cyclobenzaprine (FLEXERIL) 10 MG tablet Take 1 tablet (10 mg total) by mouth at bedtime as needed for muscle spasms. (Patient not taking: Reported on 04/05/2015) 30 tablet 0  . dexlansoprazole (DEXILANT) 60 MG capsule Take  1 capsule (60 mg total) by mouth daily. (Patient not taking: Reported on 04/05/2015) 90 capsule 3  . estrogens, conjugated, (PREMARIN) 0.625 MG tablet Take one pill by mouth daily (Patient not taking: Reported on 04/05/2015) 90 tablet 3  . lisinopril (PRINIVIL,ZESTRIL) 2.5 MG tablet Take 1 tablet (2.5 mg total) by mouth daily. (Patient not taking: Reported on 04/05/2015) 90 tablet 3  . mometasone (ELOCON) 0.1 % cream APPLY TO AFFECTED AREA ONCE DAILY AS NEEDED (Patient not taking: Reported on 04/05/2015) 15 g 1  . SUMAtriptan (IMITREX) 20 MG/ACT nasal spray Place 1 spray (20 mg total) into the nose every 2 (two) hours as needed for migraine. No more than 2 doses in 24 hours (Patient not taking: Reported on 04/05/2015) 6 Inhaler 3  . SUMAtriptan (IMITREX) 20 MG/ACT nasal spray USE 1 SPRAY INTO THE NOSE EVERY 2 HOURS AS NEEDED FOR MIGRAINE. NO MORE THAN 2 DOSES IN 24 HOURS (Patient not taking: Reported on 04/05/2015) 1 Inhaler 1   No current facility-administered medications on file prior to visit.      Review of Systems Review of Systems  Constitutional: Negative for fever, appetite change,  and unexpected weight change. Pos for exhaustion from caregiving  Eyes: Negative for pain and visual disturbance.  Respiratory: Negative for cough and shortness of breath.   Cardiovascular: Negative for cp or palpitations    Gastrointestinal: Negative for nausea, diarrhea and constipation.  Genitourinary: Negative for urgency and frequency.  Skin: Negative for pallor or rash   Neurological: Negative for weakness, light-headedness, and pos for headaches. pos for pain and tingling in R hand  Hematological: Negative for adenopathy. Does not bruise/bleed easily.  Psychiatric/Behavioral: Negative for dysphoric mood. The patient is not nervous/anxious.pos for stressors          Objective:   Physical Exam  Constitutional: She is oriented to person, place, and time. She appears well-developed and well-nourished. No distress.  Tired but well appearing   HENT:  Head: Normocephalic and atraumatic.  Left Ear: External ear normal.  Mouth/Throat: Oropharynx is clear and moist. No oropharyngeal exudate.  No sinus tenderness No temporal tenderness  No TMJ tenderness  Eyes: Conjunctivae and EOM are normal. Pupils are equal, round, and reactive to light. Right eye exhibits no discharge. Left eye exhibits no discharge. No scleral icterus.  No nystagmus  Neck: Normal range of motion and full passive range of motion without pain. Neck supple. No JVD present. Carotid bruit is not present. No tracheal deviation present. No thyromegaly present.  Cardiovascular: Normal rate, regular rhythm and normal heart sounds.   No murmur heard. Pulmonary/Chest: Effort normal and breath sounds normal. No respiratory distress. She has no wheezes. She has no rales.  Abdominal: Soft. Bowel sounds are normal. She exhibits no distension and no mass. There is no tenderness.  Musculoskeletal: She exhibits tenderness. She exhibits no  edema.  R wrist - pos tinel and phalen tests for pain and tingling in all fingers Nl grip No swelling or mass noted Nl rom wrist and hand   Lymphadenopathy:    She has no cervical adenopathy.  Neurological: She is alert and oriented to person, place, and time. She has normal strength and normal reflexes. She displays no atrophy and no tremor. No cranial nerve deficit or sensory deficit. She exhibits normal muscle tone. She displays a negative Romberg sign. Coordination and gait normal.  No focal cerebellar signs   Skin: Skin is warm and dry. No rash noted. No pallor.  Psychiatric: She  has a normal mood and affect. Her behavior is normal. Thought content normal.  Pt discusses stress of caring for an unmotivated husband          Assessment & Plan:   Problem List Items Addressed This Visit      Cardiovascular and Mediastinum   Essential hypertension    bp is up off med  pt stopped all meds due to stress caring for her husband  She plans on re starting the lisinopril  F/u planned  Disc DASH diet and exercise       Relevant Medications   lisinopril (PRINIVIL,ZESTRIL) 2.5 MG tablet   Migraine - Primary    No longer having vision change but headaches come on after being in the sun for any length of time Has upcoming opth appt  Has had injections before with neurology -unsure if helpful  More frequent now - ? If would benefit from proph therapy  Will return to neurology      Relevant Medications   lisinopril (PRINIVIL,ZESTRIL) 2.5 MG tablet   Other Relevant Orders   Ambulatory referral to Neurology     Other   Caregiver stress    Pt cares for husband with CAD and copd - and has to "do everything for him"- he only sits  She states that he could do more but refuses -and has talked to him about this  She is exhausted/ stopped caring for herself and stopped all her medicines  I stressed imp of self care We disc calling upon family to help with care -she needs a break and also  time to care for herself  Will disc further at the next visit        Hand tingling    R hand (dominant) No mass felt but she has pos tinel/phalen -suspect carpal tunnel (also some pain) Adv her to get an otc wrist splint to wear at night  Also aleve 1-2 pills with food bid (watching bp)  Ref to neuro for eval -likely NCV       Relevant Orders   Ambulatory referral to Neurology

## 2015-04-05 NOTE — Progress Notes (Signed)
Pre visit review using our clinic review tool, if applicable. No additional management support is needed unless otherwise documented below in the visit note. 

## 2015-04-05 NOTE — Patient Instructions (Signed)
I think you are developing some carpal tunnel in your right hand and that is causing numbness and pain  Buy a wrist spint over the counter and wear when it bothers you and at night Try aleve 1-2 pills with food twice daily (if you start blood pressure med back)  We will send you to neurology for evaluation (you can also discuss headaches) See the eye doctor as planned and discuss your more frequent headaches related to being in the light  Start back your blood pressure medicine - this may also get headaches Get all the help you can caring for your husband so you have a little time for self care

## 2015-04-06 ENCOUNTER — Ambulatory Visit: Payer: Medicare Other | Admitting: Neurology

## 2015-04-06 DIAGNOSIS — Z636 Dependent relative needing care at home: Secondary | ICD-10-CM | POA: Insufficient documentation

## 2015-04-06 NOTE — Assessment & Plan Note (Signed)
R hand (dominant) No mass felt but she has pos tinel/phalen -suspect carpal tunnel (also some pain) Adv her to get an otc wrist splint to wear at night  Also aleve 1-2 pills with food bid (watching bp)  Ref to neuro for eval -likely NCV

## 2015-04-06 NOTE — Assessment & Plan Note (Signed)
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

## 2015-04-06 NOTE — Assessment & Plan Note (Signed)
bp is up off med  pt stopped all meds due to stress caring for her husband  She plans on re starting the lisinopril  F/u planned  Disc DASH diet and exercise

## 2015-04-06 NOTE — Assessment & Plan Note (Signed)
No longer having vision change but headaches come on after being in the sun for any length of time Has upcoming opth appt  Has had injections before with neurology -unsure if helpful  More frequent now - ? If would benefit from proph therapy  Will return to neurology

## 2015-06-14 ENCOUNTER — Other Ambulatory Visit: Payer: Self-pay | Admitting: Family Medicine

## 2015-06-14 NOTE — Telephone Encounter (Signed)
Electronic refill request, last refilled on 02/18/14 #1 inhaler with 1 additional refill, pt has CPE scheduled on 10/05/15, please advise

## 2015-06-14 NOTE — Telephone Encounter (Signed)
done

## 2015-06-14 NOTE — Telephone Encounter (Signed)
Please refill times 2 

## 2015-06-16 ENCOUNTER — Other Ambulatory Visit: Payer: Self-pay

## 2015-06-16 MED ORDER — ALPRAZOLAM 0.25 MG PO TABS: 0.2500 mg | ORAL_TABLET | ORAL | Status: DC | PRN

## 2015-06-16 NOTE — Telephone Encounter (Signed)
Px written for call in   

## 2015-06-16 NOTE — Telephone Encounter (Signed)
Pt left v/m; pt will be flying on 06/20/15 and pt request refill alprazolam to [facility]; pt request cb when refilled. Last annual exam 03/03/13; last acute visit 04/05/15 and pt has CPX scheduled on 10/05/2015.

## 2015-06-16 NOTE — Telephone Encounter (Signed)
Rx called in as directed.   

## 2015-07-06 ENCOUNTER — Ambulatory Visit (INDEPENDENT_AMBULATORY_CARE_PROVIDER_SITE_OTHER): Payer: Medicare Other | Admitting: Family Medicine

## 2015-07-06 ENCOUNTER — Telehealth: Payer: Self-pay | Admitting: Family Medicine

## 2015-07-06 ENCOUNTER — Encounter: Payer: Self-pay | Admitting: Family Medicine

## 2015-07-06 VITALS — BP 136/84 | HR 78 | Temp 98.1°F | Wt 139.0 lb

## 2015-07-06 DIAGNOSIS — J019 Acute sinusitis, unspecified: Secondary | ICD-10-CM | POA: Insufficient documentation

## 2015-07-06 DIAGNOSIS — J0101 Acute recurrent maxillary sinusitis: Secondary | ICD-10-CM

## 2015-07-06 MED ORDER — AMOXICILLIN-POT CLAVULANATE 875-125 MG PO TABS: 1.0000 | ORAL_TABLET | Freq: Two times a day (BID) | ORAL | Status: DC

## 2015-07-06 NOTE — Progress Notes (Signed)
Pre visit review using our clinic review tool, if applicable. No additional management support is needed unless otherwise documented below in the visit note. 

## 2015-07-06 NOTE — Telephone Encounter (Signed)
Will see pt then.  

## 2015-07-06 NOTE — Telephone Encounter (Signed)
[facility] Patient Name: Joann Buchanan DOB: [birth year] Initial Comment caller states she has a sinus infection and a sore throat - is very congested Nurse Assessment Nurse: Mechele Dawley, RN, Amy Date/Time (Eastern Time): 07/06/2015 9:40:13 AM Confirm and document reason for call. If symptomatic, describe symptoms. ---SORE THROAT, ITCHY EARS, RUNNY NOSE, CONGESTED AT NIGHT. SHE WAS HAVING ISSUES ON SUNDAY MORNING. THE EARS, THROAT, DRAINAGE. SHE WAS HOPING THAT DR. Glori Bickers WOULD GIVE HER AN ANTIBIOTIC. SHE HAS PRE-OP AT 1300. SHE IS COUGHING. SHE CAN NOT TELL ABOUT A FEVER OR NOT. SHE IS TAKING MOTRIN, TRIAMETIC AND SUDAFED. SHE IS MIXING ALL OF THIS TOGETHER. Has the patient traveled out of the country within the last 30 days? ---Not Applicable Does the patient have any new or worsening symptoms? ---Yes Will a triage be completed? ---Yes Related visit to physician within the last 2 weeks? ---No Does the PT have any chronic conditions? (i.e. diabetes, asthma, etc.) ---Yes List chronic conditions. ---PER THE EPIC RECORDS Is this a behavioral health or substance abuse call? ---No Guidelines Guideline Title Affirmed Question Affirmed Notes Sinus Pain or Congestion [1] SEVERE pain AND [2] not improved 2 hours after pain medicine Final Disposition User See Physician within 4 Hours (or PCP triage) [place], RN, Amy Comments SHE WOULD LIKE TO HAVE SOMETHING JUST CALLED IN IF THAT WOULD BE APPROPRIATE. SHE HAS HER PREOP APPT TODAY AT 1300. IF SHE COULD GET SOMETHING CALLED IN SHE WOULD NOT COME IN FOR THE APPT IF DR. TOWER DID NOT FEEL NECESSARY. Referrals REFERRED TO PCP OFFICE REFERRED TO PCP OFFICE Disagree/Comply: Comply

## 2015-07-06 NOTE — Progress Notes (Signed)
Subjective:    Patient ID: Joann Buchanan, female    DOB: [birth year], 69 y.o.   MRN: [id number]  HPI Here for uri symptoms   Woke up on Sunday- congested and runny nose -then nosebleed  Raw feeling throat  Ears itch and feel full   Took some ibuprofen for sinus pain - face and teeth hurt Triaminic and sudafed   Thinks she has had a low grade temp on and off  Chills - and flushed at times   Really tired-wiped out   Cough- just a little bit / tight chest  Not productive yet    Has cataract surgery next week - next Thursday   Patient Active Problem List   Diagnosis Date Noted  . Caregiver stress 04/06/2015  . Hand tingling 04/05/2015  . Otitis media of left ear 08/18/2014  . Viral URI 08/18/2014  . Encounter for Medicare annual wellness exam 03/03/2013  . FOLLICULITIS 0000000  . OSTEOARTHRITIS, HANDS, BILATERAL 11/01/2009  . Menopausal symptoms 03/23/2009  . MELANOMA, MALIGNANT, TRUNK 02/04/2007  . THYROMEGALY 02/04/2007  . Migraine 02/04/2007  . Essential hypertension 02/04/2007  . ALLERGIC RHINITIS 02/04/2007  . GERD 02/04/2007   Past Medical History  Diagnosis Date  . Allergic rhinitis   . GERD (gastroesophageal reflux disease)   . HTN (hypertension)   . Melanoma ([place])   . Migraine   . Hx of fibrocystic disease of breast   . Osteoarthritis of hand   . Goiter     ?    Past Surgical History  Procedure Laterality Date  . Appendectomy    . Partial hysterectomy      Ovaries intact  . Tubal ligation    . Esophagogastroduodenoscopy      normal-24 hour PH probe (-)  . Emg      left upper arm- neg  . Dexa  7/07    negative  . Eye surgery  10/09    Left eye; for double vision  . Breast biopsy      normal   Social History  Substance Use Topics  . Smoking status: Never Smoker   . Smokeless tobacco: Never Used  . Alcohol Use: No   Family History  Problem Relation Age of Onset  . Other Father     Died, 68 - tree fell on him  . Breast cancer  Mother     Died, 52  . Breast cancer Sister     Died, 74  . Heart disease Sister     Died, 70  . Healthy Son   . Healthy Daughter    Allergies  Allergen Reactions  . Sulfonamide Derivatives     REACTION: unspecified   Current Outpatient Prescriptions on File Prior to Visit  Medication Sig Dispense Refill  . dexlansoprazole (DEXILANT) 60 MG capsule Take 1 capsule (60 mg total) by mouth daily. 90 capsule 3  . lisinopril (PRINIVIL,ZESTRIL) 2.5 MG tablet Take 1 tablet (2.5 mg total) by mouth daily. 90 tablet 3  . acetaminophen (TYLENOL) 650 MG CR tablet Take 650 mg by mouth every 8 (eight) hours as needed. Reported on 07/06/2015    . ALPRAZolam (XANAX) 0.25 MG tablet Take 1 tablet (0.25 mg total) by mouth as needed. Prior to flights (Patient not taking: Reported on 07/06/2015) 30 tablet 0  . cyclobenzaprine (FLEXERIL) 10 MG tablet Take 1 tablet (10 mg total) by mouth at bedtime as needed for muscle spasms. (Patient not taking: Reported on 07/06/2015) 30 tablet 0  . estrogens,  conjugated, (PREMARIN) 0.625 MG tablet Take one pill by mouth daily (Patient not taking: Reported on 07/06/2015) 90 tablet 3  . mometasone (ELOCON) 0.1 % cream APPLY TO AFFECTED AREA ONCE DAILY AS NEEDED (Patient not taking: Reported on 07/06/2015) 15 g 1  . SUMAtriptan (IMITREX) 20 MG/ACT nasal spray USE 1 SPRAY INTO THE NOSE EVERY 2 HOURS AS NEEDED FOR MIGRAINE. NO MORE THAN 2 DOSES IN 24 HOURS (Patient not taking: Reported on 07/06/2015) 1 Inhaler 1   No current facility-administered medications on file prior to visit.    Review of Systems    Review of Systems  Constitutional: Negative for fever, appetite change,  and unexpected weight change.  ENT pos for cong and rhinorrhea and sinus /mouth pain and st Eyes: Negative for pain and visual disturbance.  Respiratory: Negative for wheeze  and shortness of breath.   Cardiovascular: Negative for cp or palpitations    Gastrointestinal: Negative for nausea, diarrhea  and constipation.  Genitourinary: Negative for urgency and frequency.  Skin: Negative for pallor or rash   Neurological: Negative for weakness, light-headedness, numbness and headaches.  Hematological: Negative for adenopathy. Does not bruise/bleed easily.  Psychiatric/Behavioral: Negative for dysphoric mood. The patient is not nervous/anxious.      Objective:   Physical Exam  Constitutional: She appears well-developed and well-nourished. No distress.  HENT:  Head: Normocephalic and atraumatic.  Right Ear: External ear normal.  Left Ear: External ear normal.  Mouth/Throat: Oropharynx is clear and moist. No oropharyngeal exudate.  Nares are injected and congested  Bilateral maxillary sinus tenderness (quite marked) Post nasal drip   Eyes: Conjunctivae and EOM are normal. Pupils are equal, round, and reactive to light. Right eye exhibits no discharge. Left eye exhibits no discharge.  Neck: Normal range of motion. Neck supple.  Cardiovascular: Normal rate and regular rhythm.   Pulmonary/Chest: Effort normal and breath sounds normal. No respiratory distress. She has no wheezes. She has no rales.  Lymphadenopathy:    She has no cervical adenopathy.  Neurological: She is alert. No cranial nerve deficit.  Skin: Skin is warm and dry. No rash noted.  Psychiatric: She has a normal mood and affect.          Assessment & Plan:   Problem List Items Addressed This Visit      Respiratory   Acute sinusitis - Primary    S/p uri with marked sinus pain and tenderness Cover with augmentin Drink lots of fluids Lots of rest also  Try mucinex DM over the counter for congestion in head and chest and also it helps cough  Use nasal saline spray/rinse/netti pot  Warm compresses over sinuses help  Breathe steam every chance you get  Continue the ibuprofen   Update if not starting to improve in a week or if worsening        Relevant Medications   amoxicillin-clavulanate (AUGMENTIN) 875-125  MG tablet

## 2015-07-06 NOTE — Telephone Encounter (Signed)
Pt has appt 07/06/15 at 6 pm with Dr Glori Bickers.

## 2015-07-06 NOTE — Patient Instructions (Signed)
Drink lots of fluids Lots of rest also  Try mucinex DM over the counter for congestion in head and chest and also it helps cough  Use nasal saline spray/rinse/netti pot  Warm compresses over sinuses help  Breathe steam every chance you get  Continue the ibuprofen   Update if not starting to improve in a week or if worsening

## 2015-07-07 NOTE — Assessment & Plan Note (Signed)
S/p uri with marked sinus pain and tenderness Cover with augmentin Drink lots of fluids Lots of rest also  Try mucinex DM over the counter for congestion in head and chest and also it helps cough  Use nasal saline spray/rinse/netti pot  Warm compresses over sinuses help  Breathe steam every chance you get  Continue the ibuprofen   Update if not starting to improve in a week or if worsening

## 2015-07-10 HISTORY — PX: CATARACT EXTRACTION: SUR2

## 2015-07-22 ENCOUNTER — Ambulatory Visit: Payer: Medicare Other | Admitting: Neurology

## 2015-09-25 ENCOUNTER — Telehealth: Payer: Self-pay | Admitting: Family Medicine

## 2015-09-25 DIAGNOSIS — Z0000 Encounter for general adult medical examination without abnormal findings: Secondary | ICD-10-CM | POA: Insufficient documentation

## 2015-09-25 NOTE — Telephone Encounter (Signed)
-----   Message from Ellamae Sia sent at 09/21/2015 11:13 AM EDT ----- Regarding: Lab orders for Wednesday, 3.22.17 [facility] lab orders

## 2015-09-28 ENCOUNTER — Other Ambulatory Visit (INDEPENDENT_AMBULATORY_CARE_PROVIDER_SITE_OTHER): Payer: Medicare Other

## 2015-09-28 DIAGNOSIS — R7989 Other specified abnormal findings of blood chemistry: Secondary | ICD-10-CM | POA: Diagnosis not present

## 2015-09-28 DIAGNOSIS — Z0000 Encounter for general adult medical examination without abnormal findings: Secondary | ICD-10-CM

## 2015-09-28 LAB — CBC WITH DIFFERENTIAL/PLATELET
BASOS ABS: 0.1 10*3/uL (ref 0.0–0.1)
BASOS PCT: 0.7 % (ref 0.0–3.0)
Eosinophils Absolute: 0.2 10*3/uL (ref 0.0–0.7)
Eosinophils Relative: 2.6 % (ref 0.0–5.0)
HEMATOCRIT: 40.2 % (ref 36.0–46.0)
Hemoglobin: 13.7 g/dL (ref 12.0–15.0)
LYMPHS PCT: 49.7 % — AB (ref 12.0–46.0)
Lymphs Abs: 3.7 10*3/uL (ref 0.7–4.0)
MCHC: 34 g/dL (ref 30.0–36.0)
MCV: 87.5 fl (ref 78.0–100.0)
MONOS PCT: 9.7 % (ref 3.0–12.0)
Monocytes Absolute: 0.7 10*3/uL (ref 0.1–1.0)
NEUTROS ABS: 2.8 10*3/uL (ref 1.4–7.7)
Neutrophils Relative %: 37.3 % — ABNORMAL LOW (ref 43.0–77.0)
PLATELETS: 465 10*3/uL — AB (ref 150.0–400.0)
RBC: 4.59 Mil/uL (ref 3.87–5.11)
RDW: 13.3 % (ref 11.5–15.5)
WBC: 7.5 10*3/uL (ref 4.0–10.5)

## 2015-09-28 LAB — LIPID PANEL
CHOL/HDL RATIO: 4
CHOLESTEROL: 225 mg/dL — AB (ref 0–200)
HDL: 51.7 mg/dL (ref >39.00)
NonHDL: 172.96
TRIGLYCERIDES: 242 mg/dL — AB (ref 0.0–149.0)
VLDL: 48.4 mg/dL — AB (ref 0.0–40.0)

## 2015-09-28 LAB — COMPREHENSIVE METABOLIC PANEL
ALT: 21 U/L (ref 0–35)
AST: 19 U/L (ref 0–37)
Albumin: 4.1 g/dL (ref 3.5–5.2)
Alkaline Phosphatase: 88 U/L (ref 39–117)
BILIRUBIN TOTAL: 0.5 mg/dL (ref 0.2–1.2)
BUN: 19 mg/dL (ref 6–23)
CALCIUM: 10.3 mg/dL (ref 8.4–10.5)
CO2: 28 meq/L (ref 19–32)
Chloride: 106 mEq/L (ref 96–112)
Creatinine, Ser: 1 mg/dL (ref 0.40–1.20)
GFR: 58.57 mL/min — AB (ref >60.00)
Glucose, Bld: 100 mg/dL — ABNORMAL HIGH (ref 70–99)
Potassium: 3.8 mEq/L (ref 3.5–5.1)
Sodium: 141 mEq/L (ref 135–145)
Total Protein: 6.9 g/dL (ref 6.0–8.3)

## 2015-09-28 LAB — TSH: TSH: 2.03 u[IU]/mL (ref 0.35–4.50)

## 2015-09-28 LAB — LDL CHOLESTEROL, DIRECT: Direct LDL: 124 mg/dL

## 2015-10-05 ENCOUNTER — Encounter: Payer: Self-pay | Admitting: Family Medicine

## 2015-10-05 ENCOUNTER — Ambulatory Visit (INDEPENDENT_AMBULATORY_CARE_PROVIDER_SITE_OTHER): Payer: Medicare Other | Admitting: Family Medicine

## 2015-10-05 ENCOUNTER — Ambulatory Visit (INDEPENDENT_AMBULATORY_CARE_PROVIDER_SITE_OTHER): Payer: Medicare Other

## 2015-10-05 VITALS — BP 122/78 | HR 97 | Temp 98.4°F | Ht 60.0 in | Wt 135.8 lb

## 2015-10-05 DIAGNOSIS — E2839 Other primary ovarian failure: Secondary | ICD-10-CM | POA: Diagnosis not present

## 2015-10-05 DIAGNOSIS — R202 Paresthesia of skin: Secondary | ICD-10-CM

## 2015-10-05 DIAGNOSIS — I10 Essential (primary) hypertension: Secondary | ICD-10-CM | POA: Diagnosis not present

## 2015-10-05 DIAGNOSIS — Z0000 Encounter for general adult medical examination without abnormal findings: Secondary | ICD-10-CM

## 2015-10-05 MED ORDER — SUMATRIPTAN 20 MG/ACT NA SOLN: NASAL | Status: DC

## 2015-10-05 NOTE — Progress Notes (Signed)
Pre visit review using our clinic review tool, if applicable. No additional management support is needed unless otherwise documented below in the visit note. 

## 2015-10-05 NOTE — Patient Instructions (Signed)
Make your mammogram appointment at [facility] Try to get 1200-1500 mg of calcium per day with at least 1000 iu of vitamin D - for bone health  Stop at check out for referral to neurology (for hands) and also a referral for a bone density test  You can try benadryl for sleep at night if needed  Don't forget to follow up with dermatology for regular follow ups

## 2015-10-05 NOTE — Patient Instructions (Signed)
Ms. Klauck , Thank you for taking time to come for your Medicare Wellness Visit. I appreciate your ongoing commitment to your health goals. Please review the following plan we discussed and let me know if I can assist you in the future.   These are the goals we discussed: Goals    . Increase physical activity     When weather permits, I will begin walking for 5 miles daily 3 days per week.        This is a list of the screening recommended for you and due dates:  Health Maintenance  Topic Date Due  . Flu Shot  03/09/2016*  . Mammogram  04/08/2016*  . DEXA scan (bone density measurement)  04/08/2016*  .  Hepatitis C: One time screening is recommended by Center for Disease Control  (CDC) for  adults born from [birth year] through 1963.   10/07/2016*  . Colon Cancer Screening  08/11/2023*  . Pneumonia vaccines (1 of 2 - PCV13) 09/07/2023*  . Tetanus Vaccine  05/07/2024*  . Shingles Vaccine  Completed  *Topic was postponed. The date shown is not the original due date.   Preventive Care for Adults  A healthy lifestyle and preventive care can promote health and wellness. Preventive health guidelines for adults include the following key practices.  . A routine yearly physical is a good way to check with your health care provider about your health and preventive screening. It is a chance to share any concerns and updates on your health and to receive a thorough exam.  . Visit your dentist for a routine exam and preventive care every 6 months. Brush your teeth twice a day and floss once a day. Good oral hygiene prevents tooth decay and gum disease.  . The frequency of eye exams is based on your age, health, family medical history, use  of contact lenses, and other factors. Follow your health care provider's ecommendations for frequency of eye exams.  . Eat a healthy diet. Foods like vegetables, fruits, whole grains, low-fat dairy products, and lean protein foods contain the nutrients you need  without too many calories. Decrease your intake of foods high in solid fats, added sugars, and salt. Eat the right amount of calories for you. Get information about a proper diet from your health care provider, if necessary.  . Regular physical exercise is one of the most important things you can do for your health. Most adults should get at least 150 minutes of moderate-intensity exercise (any activity that increases your heart rate and causes you to sweat) each week. In addition, most adults need muscle-strengthening exercises on 2 or more days a week.  Silver Sneakers may be a benefit available to you. To determine eligibility, you may visit the website: www.silversneakers.com or contact program at (412)392-9649 Mon-Fri between 8AM-8PM.   . Maintain a healthy weight. The body mass index (BMI) is a screening tool to identify possible weight problems. It provides an estimate of body fat based on height and weight. Your health care provider can find your BMI and can help you achieve or maintain a healthy weight.   For adults 20 years and older: ? A BMI below 18.5 is considered underweight. ? A BMI of 18.5 to 24.9 is normal. ? A BMI of 25 to 29.9 is considered overweight. ? A BMI of 30 and above is considered obese.   . Maintain normal blood lipids and cholesterol levels by exercising and minimizing your intake of saturated fat. Eat a balanced  diet with plenty of fruit and vegetables. Blood tests for lipids and cholesterol should begin at age 76 and be repeated every 5 years. If your lipid or cholesterol levels are high, you are over 50, or you are at high risk for heart disease, you may need your cholesterol levels checked more frequently. Ongoing high lipid and cholesterol levels should be treated with medicines if diet and exercise are not working.  . If you smoke, find out from your health care provider how to quit. If you do not use tobacco, please do not start.  . If you choose to drink  alcohol, please do not consume more than 2 drinks per day. One drink is considered to be 12 ounces (355 mL) of beer, 5 ounces (148 mL) of wine, or 1.5 ounces (44 mL) of liquor.  . If you are 52-72 years old, ask your health care provider if you should take aspirin to prevent strokes.  . Use sunscreen. Apply sunscreen liberally and repeatedly throughout the day. You should seek shade when your shadow is shorter than you. Protect yourself by wearing long sleeves, pants, a wide-brimmed hat, and sunglasses year round, whenever you are outdoors.  . Once a month, do a whole body skin exam, using a mirror to look at the skin on your back. Tell your health care provider of new moles, moles that have irregular borders, moles that are larger than a pencil eraser, or moles that have changed in shape or color.

## 2015-10-05 NOTE — Progress Notes (Signed)
   Subjective:    Patient ID: Joann Buchanan, female    DOB: [birth year], 70 y.o.   MRN: [id number]  HPI    Review of Systems     Objective:   Physical Exam        Assessment & Plan:  I reviewed health advisor's note, was available for consultation, and agree with documentation and plan.

## 2015-10-05 NOTE — Progress Notes (Signed)
Subjective:   Joann Buchanan is a 70 y.o. female who presents for Medicare Annual (Subsequent) preventive examination.   Cardiac Risk Factors include: advanced age (>106men, >76 women);hypertension     Objective:     Vitals: BP 122/78 mmHg  Pulse 97  Temp(Src) 98.4 F (36.9 C) (Oral)  Ht 5' (1.524 m)  Wt 135 lb 12 oz (61.576 kg)  BMI 26.51 kg/m2  SpO2 95%  Body mass index is 26.51 kg/(m^2).   Tobacco History  Smoking status  . Never Smoker   Smokeless tobacco  . Never Used     Counseling given: No   Past Medical History  Diagnosis Date  . Allergic rhinitis   . GERD (gastroesophageal reflux disease)   . HTN (hypertension)   . Melanoma ([place])   . Migraine   . Hx of fibrocystic disease of breast   . Osteoarthritis of hand   . Goiter     ?    Past Surgical History  Procedure Laterality Date  . Appendectomy    . Partial hysterectomy      Ovaries intact  . Tubal ligation    . Esophagogastroduodenoscopy      normal-24 hour PH probe (-)  . Emg      left upper arm- neg  . Dexa  7/07    negative  . Eye surgery  10/09    Left eye; for double vision  . Breast biopsy      normal  . Cataract extraction Right Jan 2017   Family History  Problem Relation Age of Onset  . Other Father     Died, 107 - tree fell on him  . Breast cancer Mother     Died, 41  . Breast cancer Sister     Died, 39  . Heart disease Sister     Died, 27  . Healthy Son   . Healthy Daughter    History  Sexual Activity  . Sexual Activity: No    Outpatient Encounter Prescriptions as of 10/05/2015  Medication Sig  . acetaminophen (TYLENOL) 650 MG CR tablet Take 650 mg by mouth every 8 (eight) hours as needed. Reported on 07/06/2015  . ALPRAZolam (XANAX) 0.25 MG tablet Take 1 tablet (0.25 mg total) by mouth as needed. Prior to flights  . dexlansoprazole (DEXILANT) 60 MG capsule Take 1 capsule (60 mg total) by mouth daily.  Marland Kitchen lisinopril (PRINIVIL,ZESTRIL) 2.5 MG tablet Take 1 tablet  (2.5 mg total) by mouth daily.  . Multiple Vitamins-Minerals (ALIVE WOMENS 50+ PO) Take by mouth daily.  Vladimir Faster Glycol-Propyl Glycol (SYSTANE OP) Apply to eye 2 (two) times daily.  . SUMAtriptan (IMITREX) 20 MG/ACT nasal spray USE 1 SPRAY INTO THE NOSE EVERY 2 HOURS AS NEEDED FOR MIGRAINE. NO MORE THAN 2 DOSES IN 24 HOURS  . vitamin C (ASCORBIC ACID) 500 MG tablet Take 500 mg by mouth daily.  . [DISCONTINUED] amoxicillin-clavulanate (AUGMENTIN) 875-125 MG tablet Take 1 tablet by mouth 2 (two) times daily.  . [DISCONTINUED] cyclobenzaprine (FLEXERIL) 10 MG tablet Take 1 tablet (10 mg total) by mouth at bedtime as needed for muscle spasms. (Patient not taking: Reported on 07/06/2015)  . [DISCONTINUED] estrogens, conjugated, (PREMARIN) 0.625 MG tablet Take one pill by mouth daily (Patient not taking: Reported on 07/06/2015)  . [DISCONTINUED] mometasone (ELOCON) 0.1 % cream APPLY TO AFFECTED AREA ONCE DAILY AS NEEDED (Patient not taking: Reported on 07/06/2015)   No facility-administered encounter medications on file as of 10/05/2015.  Activities of Daily Living In your present state of health, do you have any difficulty performing the following activities: 10/05/2015  Hearing? N  Vision? N  Difficulty concentrating or making decisions? N  Walking or climbing stairs? N  Dressing or bathing? N  Doing errands, shopping? N  Preparing Food and eating ? N  Using the Toilet? N  In the past six months, have you accidently leaked urine? N  Do you have problems with loss of bowel control? N  Managing your Medications? N  Managing your Finances? N  Housekeeping or managing your Housekeeping? N    Patient Care Team: Abner Greenspan, MD as PCP - General Marilynne Halsted, MD as Consulting Physician (Ophthalmology)    Assessment:     Hearing Screening   125Hz  250Hz  500Hz  1000Hz  2000Hz  4000Hz  8000Hz   Right ear:   40 40 40 40   Left ear:   40 40 40 40   Vision Screening Comments: Last eye  exam 08/19/15  Exercise Activities and Dietary recommendations Current Exercise Habits: Home exercise routine, Type of exercise: walking, Time (Minutes): 60, Frequency (Times/Week): 3, Weekly Exercise (Minutes/Week): 180, Intensity: Moderate, Exercise limited by: None identified  Goals    . Increase physical activity     When weather permits, I will begin walking for 5 miles daily 3 days per week.       Fall Risk Fall Risk  10/05/2015 10/05/2015 03/03/2013  Falls in the past year? No No No   Depression Screen PHQ 2/9 Scores 10/05/2015 10/05/2015 03/03/2013  PHQ - 2 Score 0 0 0     Cognitive Testing MMSE - Mini Mental State Exam 10/05/2015  Orientation to time 5  Orientation to Place 5  Registration 3  Attention/ Calculation 0  Recall 3  Language- name 2 objects 0  Language- repeat 1  Language- follow 3 step command 3  Language- read & follow direction 0  Write a sentence 0  Copy design 0  Total score 20   PLEASE NOTE: A Mini-Cog screen was completed. Maximum score is 20. A value of 0 denotes this part of [place] MMSE was not completed.  Orientation to Time - Max 5 Orientation to Place - Max 5 Registration - Max 3 Recall - Max 3 Language Repeat - Max 1 Language Follow 3 Step Command - Max 3   Immunization History  Administered Date(s) Administered  . Td 05/08/2004  . Zoster 03/23/2009   Screening Tests Health Maintenance  Topic Date Due  . INFLUENZA VACCINE  03/09/2016 (Originally 02/07/2015)  . MAMMOGRAM  04/08/2016 (Originally 08/20/2015)  . DEXA SCAN  04/08/2016 (Originally 07/27/2012)  . Hepatitis C Screening  10/07/2016 (Originally 11/07/1945)  . COLONOSCOPY  08/11/2023 (Originally 07/27/1997)  . PNA vac Low Risk Adult (1 of 2 - PCV13) 09/07/2023 (Originally 07/27/2012)  . TETANUS/TDAP  05/07/2024 (Originally 05/08/2014)  . ZOSTAVAX  Completed      Plan:      I have personally reviewed and addressed the Medicare Annual Wellness questionnaire and have noted the  following in the patient's chart:  A. Medical and social history B. Use of alcohol, tobacco or illicit drugs  C. Current medications and supplements D. Functional ability and status E.  Nutritional status F.  Physical activity G. Advance directives H. List of other physicians I.  Hospitalizations, surgeries, and ER visits in previous 12 months J.  [place] to include hearing, vision, cognitive, depression L. Referrals and appointments - none  In addition, I have  reviewed and discussed with patient certain preventive protocols, quality metrics, and best practice recommendations. A written personalized care plan for preventive services as well as general preventive health recommendations were provided to patient.  See attached scanned questionnaire for additional information.   Signed,   Lindell Noe, MHA, BS, LPN Health Advisor [id number]

## 2015-10-05 NOTE — Progress Notes (Signed)
Subjective:    Patient ID: Joann Buchanan, female    DOB: 08/16/1945, 70 y.o.   MRN: [id number]  HPI Here for health maintenance exam and to review chronic medical problems   Had AMW visit with Katha Cabal today -reviewed that , no issues   Wt is down 4 lb with bmi of 26  Mammogram 2/16 - needed old film for comparison- then said everything is ok  Is due for f/u -she plans to schedule her own mammogram  Goes to [facility]  Self breast exam -no lumps or changes  Strong family hx of breast cancer   dexa-is interested / wants to do at [facility] No falls  No fractures  Does not take ca and D No OP in the family  No lost ht   Declines colonosocpy Colon cancer screening -not interested   Declines flu shot and pneumovax Given info about Tetanus shot so see if insurance pays - May want to check Hep C screen next year   bp is stable today  No cp or palpitations or headaches or edema  No side effects to medicines  BP Readings from Last 3 Encounters:  10/05/15 122/78  10/05/15 122/78  07/06/15 136/84     Glucose 100  Cholesterol  Lab Results  Component Value Date   CHOL 225* 09/28/2015   CHOL 194 03/03/2013   CHOL 200 01/14/2012   Lab Results  Component Value Date   HDL 51.70 09/28/2015   HDL 70.20 03/03/2013   HDL 75.40 01/14/2012   Lab Results  Component Value Date   LDLCALC 94 03/03/2013   LDLCALC 96 01/14/2012   LDLCALC 72 03/27/2010   Lab Results  Component Value Date   TRIG 242.0* 09/28/2015   TRIG 149.0 03/03/2013   TRIG 145.0 01/14/2012   Lab Results  Component Value Date   CHOLHDL 4 09/28/2015   CHOLHDL 3 03/03/2013   CHOLHDL 3 01/14/2012   Lab Results  Component Value Date   LDLDIRECT 124.0 09/28/2015  is less active lately - and this is making her energy level is down  Not a lot of time for self care  Eating healthy- smaller portions   Caregiver stress  Husband has CAD - still not doing a lot for himself / unable to do much- is trying  Copd makes  him sob  Family is helping more   Does not sleep well  Her mind gets active at night  Tries not to sleep during the day  She will get up and read Also uses coloring books   Hand problem -now both hands are numb  Neuro appt was 3 months out- she canceled it  Now worse again  She did not get brace   Migraines- are now and then - uses imitrex nasal spray   Results for orders placed or performed in visit on 09/28/15  CBC with Differential/Platelet  Result Value Ref Range   WBC 7.5 4.0 - 10.5 K/uL   RBC 4.59 3.87 - 5.11 Mil/uL   Hemoglobin 13.7 12.0 - 15.0 g/dL   HCT 40.2 36.0 - 46.0 %   MCV 87.5 78.0 - 100.0 fl   MCHC 34.0 30.0 - 36.0 g/dL   RDW 13.3 11.5 - 15.5 %   Platelets 465.0 (H) 150.0 - 400.0 K/uL   Neutrophils Relative % 37.3 (L) 43.0 - 77.0 %   Lymphocytes Relative 49.7 (H) 12.0 - 46.0 %   Monocytes Relative 9.7 3.0 - 12.0 %   Eosinophils Relative 2.6  0.0 - 5.0 %   Basophils Relative 0.7 0.0 - 3.0 %   Neutro Abs 2.8 1.4 - 7.7 K/uL   Lymphs Abs 3.7 0.7 - 4.0 K/uL   Monocytes Absolute 0.7 0.1 - 1.0 K/uL   Eosinophils Absolute 0.2 0.0 - 0.7 K/uL   Basophils Absolute 0.1 0.0 - 0.1 K/uL  Comprehensive metabolic panel  Result Value Ref Range   Sodium 141 135 - 145 mEq/L   Potassium 3.8 3.5 - 5.1 mEq/L   Chloride 106 96 - 112 mEq/L   CO2 28 19 - 32 mEq/L   Glucose, Bld 100 (H) 70 - 99 mg/dL   BUN 19 6 - 23 mg/dL   Creatinine, Ser 1.00 0.40 - 1.20 mg/dL   Total Bilirubin 0.5 0.2 - 1.2 mg/dL   Alkaline Phosphatase 88 39 - 117 U/L   AST 19 0 - 37 U/L   ALT 21 0 - 35 U/L   Total Protein 6.9 6.0 - 8.3 g/dL   Albumin 4.1 3.5 - 5.2 g/dL   Calcium 10.3 8.4 - 10.5 mg/dL   GFR 58.57 (L) >60.00 mL/min  Lipid panel  Result Value Ref Range   Cholesterol 225 (H) 0 - 200 mg/dL   Triglycerides 242.0 (H) 0.0 - 149.0 mg/dL   HDL 51.70 >39.00 mg/dL   VLDL 48.4 (H) 0.0 - 40.0 mg/dL   Total CHOL/HDL Ratio 4    NonHDL 172.96   TSH  Result Value Ref Range   TSH 2.03 0.35 - 4.50  uIU/mL  LDL cholesterol, direct  Result Value Ref Range   Direct LDL 124.0 mg/dL    Platelets are up  No changes in bruising or bleeding (with age she bleeds more easily) She does donate blood with [facility]   Patient Active Problem List   Diagnosis Date Noted  . Estrogen deficiency 10/05/2015  . Routine general medical examination at a health care facility 09/25/2015  . Acute sinusitis 07/06/2015  . Caregiver stress 04/06/2015  . Hand tingling 04/05/2015  . Otitis media of left ear 08/18/2014  . Viral URI 08/18/2014  . Encounter for Medicare annual wellness exam 03/03/2013  . FOLLICULITIS 0000000  . OSTEOARTHRITIS, HANDS, BILATERAL 11/01/2009  . Menopausal symptoms 03/23/2009  . MELANOMA, MALIGNANT, TRUNK 02/04/2007  . THYROMEGALY 02/04/2007  . Migraine 02/04/2007  . Essential hypertension 02/04/2007  . ALLERGIC RHINITIS 02/04/2007  . GERD 02/04/2007   Past Medical History  Diagnosis Date  . Allergic rhinitis   . GERD (gastroesophageal reflux disease)   . HTN (hypertension)   . Melanoma ([place])   . Migraine   . Hx of fibrocystic disease of breast   . Osteoarthritis of hand   . Goiter     ?    Past Surgical History  Procedure Laterality Date  . Appendectomy    . Partial hysterectomy      Ovaries intact  . Tubal ligation    . Esophagogastroduodenoscopy      normal-24 hour PH probe (-)  . Emg      left upper arm- neg  . Dexa  7/07    negative  . Eye surgery  10/09    Left eye; for double vision  . Breast biopsy      normal  . Cataract extraction Right Jan 2017   Social History  Substance Use Topics  . Smoking status: Never Smoker   . Smokeless tobacco: Never Used  . Alcohol Use: No   Family History  Problem Relation Age of Onset  .  Other Father     Died, 26 - tree fell on him  . Breast cancer Mother     Died, 83  . Breast cancer Sister     Died, 5  . Heart disease Sister     Died, 13  . Healthy Son   . Healthy Daughter    Allergies    Allergen Reactions  . Sulfonamide Derivatives     REACTION: unspecified   Current Outpatient Prescriptions on File Prior to Visit  Medication Sig Dispense Refill  . acetaminophen (TYLENOL) 650 MG CR tablet Take 650 mg by mouth every 8 (eight) hours as needed. Reported on 07/06/2015    . ALPRAZolam (XANAX) 0.25 MG tablet Take 1 tablet (0.25 mg total) by mouth as needed. Prior to flights 30 tablet 0  . dexlansoprazole (DEXILANT) 60 MG capsule Take 1 capsule (60 mg total) by mouth daily. 90 capsule 3  . lisinopril (PRINIVIL,ZESTRIL) 2.5 MG tablet Take 1 tablet (2.5 mg total) by mouth daily. 90 tablet 3   No current facility-administered medications on file prior to visit.     Review of Systems Review of Systems  Constitutional: Negative for fever, appetite change, fatigue and unexpected weight change.  Eyes: Negative for pain and visual disturbance.  Respiratory: Negative for cough and shortness of breath.   Cardiovascular: Negative for cp or palpitations    Gastrointestinal: Negative for nausea, diarrhea and constipation.  Genitourinary: Negative for urgency and frequency.  Skin: Negative for pallor or rash   Neurological: Negative for weakness, light-headedness, numbness and headaches.  Hematological: Negative for adenopathy. Does not bruise/bleed easily.  Psychiatric/Behavioral: Negative for dysphoric mood. The patient is not nervous/anxious.         Objective:   Physical Exam  Constitutional: She appears well-developed and well-nourished. No distress.  Well appearing   HENT:  Head: Normocephalic and atraumatic.  Right Ear: External ear normal.  Left Ear: External ear normal.  Mouth/Throat: Oropharynx is clear and moist.  Eyes: Conjunctivae and EOM are normal. Pupils are equal, round, and reactive to light. No scleral icterus.  Neck: Normal range of motion. Neck supple. No JVD present. Carotid bruit is not present.  Cardiovascular: Normal rate, regular rhythm, normal heart  sounds and intact distal pulses.  Exam reveals no gallop.   Pulmonary/Chest: Effort normal and breath sounds normal. No respiratory distress. She has no wheezes. She exhibits no tenderness.  Abdominal: Soft. Bowel sounds are normal. She exhibits no distension, no abdominal bruit and no mass. There is no tenderness.  Genitourinary: No breast swelling, tenderness, discharge or bleeding.  Breast exam: No mass, nodules, thickening, tenderness, bulging, retraction, inflamation, nipple discharge or skin changes noted.  No axillary or clavicular LA.      Musculoskeletal: Normal range of motion. She exhibits no edema or tenderness.  Nl grip/both hands today  Lymphadenopathy:    She has no cervical adenopathy.  Neurological: She is alert. She has normal reflexes. No cranial nerve deficit. She exhibits normal muscle tone. Coordination normal.  Skin: Skin is warm and dry. No rash noted. No erythema. No pallor.  Scattered sks  Psychiatric: She has a normal mood and affect.          Assessment & Plan:   Problem List Items Addressed This Visit      Cardiovascular and Mediastinum   Essential hypertension    bp in fair control at this time  BP Readings from Last 1 Encounters:  10/05/15 122/78   No changes needed Disc lifstyle  change with low sodium diet and exercise  Labs reviewed         Other   Estrogen deficiency    Ref for dexa   Disc need for calcium/ vitamin D/ wt bearing exercise and bone density test every 2 y to monitor Disc safety/ fracture risk in detail        Relevant Orders   DG Bone Density   Hand tingling    Ongoing - did cancel original neuro appt due to imp-now worse and in both hands Ref placed for neuro - may need NCV      Relevant Orders   Ambulatory referral to Neurology   Routine general medical examination at a health care facility - Primary    Reviewed health habits including diet and exercise and skin cancer prevention Reviewed appropriate screening  tests for age  Also reviewed health mt list, fam hx and immunization status , as well as social and family history   AMW visit reviewed  See HPI Labs reviewed Make your mammogram appointment at [facility] Try to get 1200-1500 mg of calcium per day with at least 1000 iu of vitamin D - for bone health  Stop at check out for referral to neurology (for hands) and also a referral for a bone density test  You can try benadryl for sleep at night if needed  Don't forget to follow up with dermatology for regular follow ups

## 2015-10-08 NOTE — Assessment & Plan Note (Signed)
Ongoing - did cancel original neuro appt due to imp-now worse and in both hands Ref placed for neuro - may need NCV

## 2015-10-08 NOTE — Assessment & Plan Note (Signed)
bp in fair control at this time  BP Readings from Last 1 Encounters:  10/05/15 122/78   No changes needed Disc lifstyle change with low sodium diet and exercise  Labs reviewed

## 2015-10-08 NOTE — Assessment & Plan Note (Signed)
Reviewed health habits including diet and exercise and skin cancer prevention Reviewed appropriate screening tests for age  Also reviewed health mt list, fam hx and immunization status , as well as social and family history   AMW visit reviewed  See HPI Labs reviewed Make your mammogram appointment at [facility] Try to get 1200-1500 mg of calcium per day with at least 1000 iu of vitamin D - for bone health  Stop at check out for referral to neurology (for hands) and also a referral for a bone density test  You can try benadryl for sleep at night if needed  Don't forget to follow up with dermatology for regular follow ups

## 2015-10-08 NOTE — Assessment & Plan Note (Signed)
Ref for dexa  Disc need for calcium/ vitamin D/ wt bearing exercise and bone density test every 2 y to monitor Disc safety/ fracture risk in detail   

## 2015-10-10 ENCOUNTER — Encounter: Payer: Self-pay | Admitting: Neurology

## 2015-10-10 ENCOUNTER — Ambulatory Visit (INDEPENDENT_AMBULATORY_CARE_PROVIDER_SITE_OTHER): Payer: Medicare Other | Admitting: Neurology

## 2015-10-10 VITALS — BP 130/80 | HR 83 | Ht 60.0 in | Wt 137.1 lb

## 2015-10-10 DIAGNOSIS — R202 Paresthesia of skin: Secondary | ICD-10-CM

## 2015-10-10 DIAGNOSIS — R29898 Other symptoms and signs involving the musculoskeletal system: Secondary | ICD-10-CM

## 2015-10-10 DIAGNOSIS — M6289 Other specified disorders of muscle: Secondary | ICD-10-CM | POA: Diagnosis not present

## 2015-10-10 DIAGNOSIS — M542 Cervicalgia: Secondary | ICD-10-CM

## 2015-10-10 DIAGNOSIS — R292 Abnormal reflex: Secondary | ICD-10-CM

## 2015-10-10 DIAGNOSIS — G959 Disease of spinal cord, unspecified: Secondary | ICD-10-CM

## 2015-10-10 NOTE — Patient Instructions (Addendum)
1.  MRI cervical spine with and without contrast.  We will call you with the results of the testing. 2.  If your hand tingling becomes painful, call my office and we can send some medication for the discomfort 3.  Return to clinic in 2 months

## 2015-10-10 NOTE — Progress Notes (Signed)
Follow-up Visit   Date: 10/10/2015   Joann Buchanan MRN: [id number] DOB: Nov 27, 1945   Interim History: Joann Buchanan is a 70 y.o. right-handed Caucasian female with GERD, HTN, and migraines returning to the clinic for new complaints of bilateral hand paresthesias.  The patient was accompanied to the clinic by self.  History of present illness Initial visit 04/10/2013 for headaches:   Migraines started in her late 13s and stopped in her 30s. She was previously on propranolol and antidepressant, but she did not tolerate it. They started again intermittently over the past 10-15 years and have become worse over the past two months. Headaches are located over center of the forehead, described pounding, and radiates towards the neck. It usually starts in the afternoon and if she is able to sleep it off, will be better by the morning. If she unable to sleep, then she is unable to focus and feels like a "hangover". Frequency is twice per month. She has noticed that onions trigger her symptoms. There is associated photophobia, phonophobia, nausea, and vomiting. She has visual auras after the headache, described wavy lines and blurry vision and there is occasional tingling over the left side of her face. She takes imitrex nasal spray and takes it twice per month which works. She takes motrin or Aleve 3-4 times per week and used flexiril for the past two nights which has helped.   Around the end of July 2014, she developed a new headache at the base of her headache, characterized as pressure-like stinging sensation, as if her hair is being pulled. This headache occurs in the morning and has been constant. She was administered greater occipital nerve block which alleviated symptoms.    UPDATE 10/10/2015: Starting around early March, she began noticing tingling and numbness over the tips of her fingers which gradually progressed to involve her palm and dorsum of the hand within a week.   Currently, she has constant paresthesias involving both hands to the level of the wrists. She is more careful when lifting objects and fine motor tasks such as putting on make up or jewelry. There are no identifiable factors the improve to exacerbate symptoms.  She does not have any neck pain or similar paresthesias of the feet.  She is not diabetic, alcoholic, or thyroid disease.      Headaches are much improved, she only takes imitrex twice per month.    Medications:  Current Outpatient Prescriptions on File Prior to Visit  Medication Sig Dispense Refill  . acetaminophen (TYLENOL) 650 MG CR tablet Take 650 mg by mouth every 8 (eight) hours as needed. Reported on 07/06/2015    . ALPRAZolam (XANAX) 0.25 MG tablet Take 1 tablet (0.25 mg total) by mouth as needed. Prior to flights 30 tablet 0  . dexlansoprazole (DEXILANT) 60 MG capsule Take 1 capsule (60 mg total) by mouth daily. 90 capsule 3  . lisinopril (PRINIVIL,ZESTRIL) 2.5 MG tablet Take 1 tablet (2.5 mg total) by mouth daily. 90 tablet 3  . Multiple Vitamins-Minerals (ALIVE WOMENS 50+ PO) Take by mouth daily.    Joann Buchanan Glycol-Propyl Glycol (SYSTANE OP) Apply to eye 2 (two) times daily.    . SUMAtriptan (IMITREX) 20 MG/ACT nasal spray USE 1 SPRAY INTO THE NOSE EVERY 2 HOURS AS NEEDED FOR MIGRAINE. NO MORE THAN 2 DOSES IN 24 HOURS 1 Inhaler 3  . vitamin C (ASCORBIC ACID) 500 MG tablet Take 500 mg by mouth daily.     No current facility-administered  medications on file prior to visit.    Allergies:  Allergies  Allergen Reactions  . Sulfonamide Derivatives     REACTION: unspecified    Review of Systems:  CONSTITUTIONAL: No fevers, chills, night sweats, or weight loss.  EYES: No visual changes or eye pain ENT: No hearing changes.  No history of nose bleeds.   RESPIRATORY: No cough, wheezing and shortness of breath.   CARDIOVASCULAR: Negative for chest pain, and palpitations.   GI: Negative for abdominal discomfort, blood in  stools or black stools.  No recent change in bowel habits.   GU:  No history of incontinence.   MUSCLOSKELETAL: No history of joint pain or swelling.  No myalgias.   SKIN: Negative for lesions, rash, and itching.   ENDOCRINE: Negative for cold or heat intolerance, polydipsia or goiter.   PSYCH:  No depression or anxiety symptoms.   NEURO: As Above.   Vital Signs:  BP 130/80 mmHg  Pulse 83  Ht 5' (1.524 m)  Wt 137 lb 1 oz (62.171 kg)  BMI 26.77 kg/m2  SpO2 95% Pain Scale: 0 on a scale of 0-10   General: Well appearing Neck: no carotid bruit CV: RRR Ext: No edema  Neurological Exam: MENTAL STATUS including orientation to time, place, person, recent and remote memory, attention span and concentration, language, and fund of knowledge is normal.  Speech is not dysarthric.  CRANIAL NERVES:  Normal fundoscopic exam.  No visual field defects. Pupils equal round and reactive to light.  Normal conjugate, extra-ocular eye movements in all directions of gaze.  No ptosis. Normal facial sensation.  Face is symmetric. Palate elevates symmetrically.  Tongue is midline.  MOTOR:  Motor strength is 5/5 in all extremities, except mild intrinsic hand weakness 5-/5 (L >R).  No atrophy, fasciculations or abnormal movements.  No pronator drift.  Tone is normal.    MSRs:  Reflexes are 3+/4 throughout, except Achilles are 2+/4 bilaterally.  Hoffman's sign is present.  Plantars are down going.  There is spread into the finger flexors with medial pectoralis reflex testing.  SENSORY:  Temperature is reduced over the dorsum and palmer surfaces of the hand bilaterally to the wrist. Pin prick, light touch, and vibration intact.  Lhermitte's sign negative.  Negative Tinel's sign at the wrist on the right.  Positive Tinel's at the wrist on the left.  COORDINATION/GAIT:  Dysmetria with finger-to-nose-testing on the right only.  Intact rapid alternating movements bilaterally.  Gait narrow based and stable.    Data: Lab Results  Component Value Date   TSH 2.03 09/28/2015    IMPRESSION/PLAN: 1.  Bilateral hand paresthesias in the setting of hyperreflexia is most concerning for structural lesion such as myelopathy or syrinx, so MRI cervical spine wwo contrast will be obtained.  Because paresthesias involve the entire palm and dorsum this is less likely to be nerve entrapment such as CTS or ulnar neuropathy.  Peripheral neuropathy is considered but reflexes would be diminished and would expect paresthesias also involving the feet.  If imaging does not show a structural lesion, will need to check labs for myelopathy.  2.  Episodic migraines, well controlled and occuring only 1-2 times per month.  Continue imitrex as needed, but limit to twice per week.  Return to clinic in 2 months   The duration of this appointment visit was 35 minutes of face-to-face time with the patient.  Greater than 50% of this time was spent in counseling, explanation of diagnosis, planning of further management,  and coordination of care.   Thank you for allowing me to participate in patient's care.  If I can answer any additional questions, I would be pleased to do so.    Sincerely,    Donika K. Posey Pronto, DO

## 2015-10-14 LAB — HM DEXA SCAN

## 2015-10-25 ENCOUNTER — Telehealth: Payer: Self-pay | Admitting: Neurology

## 2015-10-25 ENCOUNTER — Encounter: Payer: Self-pay | Admitting: Family Medicine

## 2015-10-25 ENCOUNTER — Ambulatory Visit
Admission: RE | Admit: 2015-10-25 | Discharge: 2015-10-25 | Disposition: A | Discharge status: Home / Self Care | Payer: Medicare Other | Source: Ambulatory Visit | Attending: Neurology | Admitting: Neurology

## 2015-10-25 DIAGNOSIS — M542 Cervicalgia: Secondary | ICD-10-CM

## 2015-10-25 DIAGNOSIS — R29898 Other symptoms and signs involving the musculoskeletal system: Secondary | ICD-10-CM

## 2015-10-25 DIAGNOSIS — M8580 Other specified disorders of bone density and structure, unspecified site: Secondary | ICD-10-CM | POA: Insufficient documentation

## 2015-10-25 DIAGNOSIS — R202 Paresthesia of skin: Secondary | ICD-10-CM

## 2015-10-25 DIAGNOSIS — R292 Abnormal reflex: Secondary | ICD-10-CM

## 2015-10-25 MED ORDER — GABAPENTIN 300 MG PO CAPS: ORAL_CAPSULE | ORAL | Status: DC

## 2015-10-25 MED ORDER — GADOBENATE DIMEGLUMINE 529 MG/ML IV SOLN
12.0000 mL | Freq: Once | INTRAVENOUS | Status: AC | PRN
  Administered 2015-10-25: 12 mL via INTRAVENOUS

## 2015-10-25 NOTE — Telephone Encounter (Signed)
Called and informed patient of MRI cervical spine results which shows moderate disc protrusion at C4-5 with mass effect on the spinal cord. She endorses worsening paresthesias of the hand and hand weakness.  She will be referred to neurosurgery for surgical evaluation. In the meantime, gabapentin 300 mg twice daily will be started. Common side effects were discussed.   Mahathi Pokorney K. Posey Pronto, DO

## 2015-10-26 NOTE — Telephone Encounter (Signed)
Referral faxed

## 2015-10-27 ENCOUNTER — Encounter: Payer: Self-pay | Admitting: Family Medicine

## 2015-10-28 ENCOUNTER — Encounter: Payer: Self-pay | Admitting: *Deleted

## 2015-11-01 ENCOUNTER — Other Ambulatory Visit: Payer: Self-pay | Admitting: Neurological Surgery

## 2015-11-22 NOTE — Pre-Procedure Instructions (Signed)
EDLA BURNOR  11/22/2015      [facility] - [address], [place] [address] Phone: [phone] Fax: [phone]  [id number] - [place], [place] [address] Phone: [phone] Fax: [place], [place] [address] [address] [place] [address] [place] [address] Phone: [phone] Fax: [phone]    Your procedure is scheduled on   Friday  12/02/15.  Report to [facility] Admitting at 530 A.M.  Call this number if you have problems the morning of surgery:  [phone]   Remember:  Do not eat food or drink liquids after midnight.  Take these medicines the morning of surgery with A SIP OF WATER alprazolam (xanax), gabapentin (neurtontin), omeprazole (prilosec), eye drops  Stop 7 days prior to surgery: Aspirin, ibuprofen, Advil, motrin, BC's, Goody's, all herbal medications, all vitamins   Do not wear jewelry, make-up or nail polish.  Do not wear lotions, powders, or perfumes.  You may wear deodorant.  Do not shave 48 hours prior to surgery.  Men may shave face and neck.  Do not bring valuables to the hospital.  [facility] is not responsible for any belongings or valuables.  Contacts, dentures or bridgework may not be worn into surgery.  Leave your suitcase in the car.  After surgery it may be brought to your room.  For patients admitted to the hospital, discharge time will be determined by your treatment team.  Patients discharged the day of surgery will not be allowed to drive home.   Name and phone number of your driver:    Special instructions:  [facility] - Preparing for Surgery  Before surgery, you can play an important role.  Because skin is not sterile, your skin needs to be as free of germs as possible.  You can reduce the number of germs on you skin by washing with CHG  (chlorahexidine gluconate) soap before surgery.  CHG is an antiseptic cleaner which kills germs and bonds with the skin to continue killing germs even after washing.  Please DO NOT use if you have an allergy to CHG or antibacterial soaps.  If your skin becomes reddened/irritated stop using the CHG and inform your nurse when you arrive at Short Stay.  Do not shave (including legs and underarms) for at least 48 hours prior to the first CHG shower.  You may shave your face.  Please follow these instructions carefully:   1.  Shower with CHG Soap the night before surgery and the                                morning of Surgery.  2.  If you choose to wash your hair, wash your hair first as usual with your       normal shampoo.  3.  After you shampoo, rinse your hair and body thoroughly to remove the                      Shampoo.  4.  Use CHG as you would any other liquid soap.  You can apply chg directly       to the skin and wash gently with scrungie or a clean washcloth.  5.  Apply the CHG Soap to your body  ONLY FROM THE NECK DOWN.        Do not use on open wounds or open sores.  Avoid contact with your eyes,       ears, mouth and genitals (private parts).  Wash genitals (private parts)       with your normal soap.  6.  Wash thoroughly, paying special attention to the area where your surgery        will be performed.  7.  Thoroughly rinse your body with warm water from the neck down.  8.  DO NOT shower/wash with your normal soap after using and rinsing off       the CHG Soap.  9.  Pat yourself dry with a clean towel.            10.  Wear clean pajamas.            11.  Place clean sheets on your bed the night of your first shower and do not        sleep with pets.  Day of Surgery  Do not apply any lotions/deoderants the morning of surgery.  Please wear clean clothes to the hospital/surgery center.    Please read over the following fact sheets that you were given. Pain Booklet, Coughing and  Deep Breathing, MRSA Information and Surgical Site Infection Prevention

## 2015-11-23 ENCOUNTER — Encounter (HOSPITAL_COMMUNITY)
Admission: RE | Admit: 2015-11-23 | Discharge: 2015-11-23 | Disposition: A | Discharge status: Home / Self Care | Payer: Medicare Other | Source: Ambulatory Visit | Attending: Neurological Surgery | Admitting: Neurological Surgery

## 2015-11-23 ENCOUNTER — Encounter (HOSPITAL_COMMUNITY): Payer: Self-pay

## 2015-11-23 DIAGNOSIS — Z01818 Encounter for other preprocedural examination: Secondary | ICD-10-CM | POA: Insufficient documentation

## 2015-11-23 DIAGNOSIS — Z01812 Encounter for preprocedural laboratory examination: Secondary | ICD-10-CM | POA: Insufficient documentation

## 2015-11-23 DIAGNOSIS — R001 Bradycardia, unspecified: Secondary | ICD-10-CM | POA: Insufficient documentation

## 2015-11-23 DIAGNOSIS — M4802 Spinal stenosis, cervical region: Secondary | ICD-10-CM | POA: Diagnosis not present

## 2015-11-23 HISTORY — DX: Other specified postprocedural states: R11.2

## 2015-11-23 HISTORY — DX: Anesthesia of skin: R20.2

## 2015-11-23 HISTORY — DX: Anesthesia of skin: R20.0

## 2015-11-23 HISTORY — DX: Other specified postprocedural states: Z98.890

## 2015-11-23 LAB — CBC WITH DIFFERENTIAL/PLATELET
BASOS ABS: 0 10*3/uL (ref 0.0–0.1)
Basophils Relative: 1 %
EOS PCT: 2 %
Eosinophils Absolute: 0.1 10*3/uL (ref 0.0–0.7)
HEMATOCRIT: 39.7 % (ref 36.0–46.0)
Hemoglobin: 13.1 g/dL (ref 12.0–15.0)
LYMPHS ABS: 2.9 10*3/uL (ref 0.7–4.0)
LYMPHS PCT: 47 %
MCH: 29.4 pg (ref 26.0–34.0)
MCHC: 33 g/dL (ref 30.0–36.0)
MCV: 89.2 fL (ref 78.0–100.0)
MONO ABS: 0.6 10*3/uL (ref 0.1–1.0)
Monocytes Relative: 11 %
NEUTROS ABS: 2.4 10*3/uL (ref 1.7–7.7)
Neutrophils Relative %: 39 %
PLATELETS: 393 10*3/uL (ref 150–400)
RBC: 4.45 MIL/uL (ref 3.87–5.11)
RDW: 13.5 % (ref 11.5–15.5)
WBC: 6.1 10*3/uL (ref 4.0–10.5)

## 2015-11-23 LAB — BASIC METABOLIC PANEL
ANION GAP: 13 (ref 5–15)
BUN: 13 mg/dL (ref 6–20)
CHLORIDE: 106 mmol/L (ref 101–111)
CO2: 23 mmol/L (ref 22–32)
Calcium: 10.1 mg/dL (ref 8.9–10.3)
Creatinine, Ser: 1.02 mg/dL — ABNORMAL HIGH (ref 0.44–1.00)
GFR calc Af Amer: >60 mL/min (ref >60)
GFR, EST NON AFRICAN AMERICAN: 55 mL/min — AB (ref >60)
GLUCOSE: 102 mg/dL — AB (ref 65–99)
POTASSIUM: 4.3 mmol/L (ref 3.5–5.1)
Sodium: 142 mmol/L (ref 135–145)

## 2015-11-23 LAB — PROTIME-INR
INR: 1.05 (ref 0.00–1.49)
Prothrombin Time: 13.9 seconds (ref 11.6–15.2)

## 2015-11-23 LAB — SURGICAL PCR SCREEN
MRSA, PCR: NEGATIVE
STAPHYLOCOCCUS AUREUS: NEGATIVE

## 2015-12-01 MED ORDER — CEFAZOLIN SODIUM-DEXTROSE 2-4 GM/100ML-% IV SOLN
2.0000 g | INTRAVENOUS | Status: AC
  Administered 2015-12-02: 2 g via INTRAVENOUS
  Filled 2015-12-01: qty 100

## 2015-12-01 MED ORDER — DEXAMETHASONE SODIUM PHOSPHATE 10 MG/ML IJ SOLN
10.0000 mg | INTRAMUSCULAR | Status: AC
  Administered 2015-12-02: 10 mg via INTRAVENOUS
  Filled 2015-12-01: qty 1

## 2015-12-01 NOTE — Anesthesia Preprocedure Evaluation (Addendum)
Anesthesia Evaluation  Patient identified by MRN, date of birth, ID band Patient awake    Reviewed: Allergy & Precautions, H&P , NPO status , Patient's Chart, lab work & pertinent test results  History of Anesthesia Complications (+) PONV  Airway Mallampati: II  TM Distance: >3 FB Neck ROM: full    Dental no notable dental hx. (+) Teeth Intact, Dental Advisory Given   Pulmonary neg pulmonary ROS,    Pulmonary exam normal breath sounds clear to auscultation       Cardiovascular hypertension, Pt. on medications Normal cardiovascular exam Rhythm:regular Rate:Normal     Neuro/Psych negative neurological ROS  negative psych ROS   GI/Hepatic negative GI ROS, Neg liver ROS, GERD  Medicated and Controlled,  Endo/Other  negative endocrine ROS  Renal/GU negative Renal ROS  negative genitourinary   Musculoskeletal   Abdominal   Peds  Hematology negative hematology ROS (+)   Anesthesia Other Findings   Reproductive/Obstetrics negative OB ROS                            Anesthesia Physical Anesthesia Plan  ASA: II  Anesthesia Plan: General   Post-op Pain Management:    Induction: Intravenous  Airway Management Planned: Oral ETT  Additional Equipment:   Intra-op Plan:   Post-operative Plan: Extubation in OR  Informed Consent: I have reviewed the patients History and Physical, chart, labs and discussed the procedure including the risks, benefits and alternatives for the proposed anesthesia with the patient or authorized representative who has indicated his/her understanding and acceptance.   Dental Advisory Given  Plan Discussed with: CRNA and Surgeon  Anesthesia Plan Comments:        Anesthesia Quick Evaluation

## 2015-12-02 ENCOUNTER — Inpatient Hospital Stay (HOSPITAL_COMMUNITY): Payer: Medicare Other

## 2015-12-02 ENCOUNTER — Ambulatory Visit (HOSPITAL_COMMUNITY)
Admission: RE | Admit: 2015-12-02 | Discharge: 2015-12-05 | Disposition: A | Discharge status: Home / Self Care | Payer: Medicare Other | Source: Ambulatory Visit | Attending: Neurological Surgery | Admitting: Neurological Surgery

## 2015-12-02 ENCOUNTER — Inpatient Hospital Stay (HOSPITAL_COMMUNITY): Payer: Medicare Other | Admitting: Anesthesiology

## 2015-12-02 ENCOUNTER — Encounter (HOSPITAL_COMMUNITY): Payer: Self-pay | Admitting: Surgery

## 2015-12-02 ENCOUNTER — Encounter (HOSPITAL_COMMUNITY)
Admission: RE | Disposition: A | Discharge status: Home / Self Care | Payer: Self-pay | Source: Ambulatory Visit | Attending: Neurological Surgery

## 2015-12-02 DIAGNOSIS — I10 Essential (primary) hypertension: Secondary | ICD-10-CM | POA: Insufficient documentation

## 2015-12-02 DIAGNOSIS — I2510 Atherosclerotic heart disease of native coronary artery without angina pectoris: Secondary | ICD-10-CM | POA: Insufficient documentation

## 2015-12-02 DIAGNOSIS — M50021 Cervical disc disorder at C4-C5 level with myelopathy: Principal | ICD-10-CM | POA: Insufficient documentation

## 2015-12-02 DIAGNOSIS — Z981 Arthrodesis status: Secondary | ICD-10-CM

## 2015-12-02 DIAGNOSIS — Z419 Encounter for procedure for purposes other than remedying health state, unspecified: Secondary | ICD-10-CM

## 2015-12-02 HISTORY — PX: ANTERIOR CERVICAL DECOMP/DISCECTOMY FUSION: SHX1161

## 2015-12-02 SURGERY — ANTERIOR CERVICAL DECOMPRESSION/DISCECTOMY FUSION 1 LEVEL
Anesthesia: General

## 2015-12-02 MED ORDER — ROCURONIUM BROMIDE 50 MG/5ML IV SOLN
INTRAVENOUS | Status: AC
  Filled 2015-12-02: qty 1

## 2015-12-02 MED ORDER — HYDROMORPHONE HCL 1 MG/ML IJ SOLN
INTRAMUSCULAR | Status: AC
  Filled 2015-12-02: qty 1

## 2015-12-02 MED ORDER — SUCCINYLCHOLINE CHLORIDE 20 MG/ML IJ SOLN
INTRAMUSCULAR | Status: DC | PRN
  Administered 2015-12-02: 100 mg via INTRAVENOUS

## 2015-12-02 MED ORDER — FENTANYL CITRATE (PF) 250 MCG/5ML IJ SOLN
INTRAMUSCULAR | Status: AC
  Filled 2015-12-02: qty 5

## 2015-12-02 MED ORDER — MORPHINE SULFATE (PF) 2 MG/ML IV SOLN
1.0000 mg | INTRAVENOUS | Status: DC | PRN
  Administered 2015-12-02 (×2): 2 mg via INTRAVENOUS
  Administered 2015-12-03 (×3): 4 mg via INTRAVENOUS
  Filled 2015-12-02: qty 1
  Filled 2015-12-02 (×3): qty 2
  Filled 2015-12-02: qty 1

## 2015-12-02 MED ORDER — HYDROMORPHONE HCL 1 MG/ML IJ SOLN
0.2500 mg | INTRAMUSCULAR | Status: DC | PRN
  Administered 2015-12-02 (×3): 0.5 mg via INTRAVENOUS

## 2015-12-02 MED ORDER — PROPOFOL 10 MG/ML IV BOLUS
INTRAVENOUS | Status: DC | PRN
  Administered 2015-12-02: 160 mg via INTRAVENOUS

## 2015-12-02 MED ORDER — SUGAMMADEX SODIUM 200 MG/2ML IV SOLN
INTRAVENOUS | Status: AC
  Filled 2015-12-02: qty 2

## 2015-12-02 MED ORDER — ONDANSETRON HCL 4 MG/2ML IJ SOLN
4.0000 mg | INTRAMUSCULAR | Status: DC | PRN
  Administered 2015-12-02 (×2): 4 mg via INTRAVENOUS
  Filled 2015-12-02: qty 2

## 2015-12-02 MED ORDER — ONDANSETRON HCL 4 MG/2ML IJ SOLN
INTRAMUSCULAR | Status: AC
  Filled 2015-12-02: qty 2

## 2015-12-02 MED ORDER — THROMBIN 5000 UNITS EX SOLR
CUTANEOUS | Status: DC | PRN
  Administered 2015-12-02: 07:00:00 via TOPICAL

## 2015-12-02 MED ORDER — METHOCARBAMOL 1000 MG/10ML IJ SOLN
500.0000 mg | Freq: Four times a day (QID) | INTRAVENOUS | Status: DC | PRN
  Filled 2015-12-02: qty 5

## 2015-12-02 MED ORDER — PHENOL 1.4 % MT LIQD
1.0000 | OROMUCOSAL | Status: DC | PRN
  Administered 2015-12-02: 1 via OROMUCOSAL
  Filled 2015-12-02: qty 177

## 2015-12-02 MED ORDER — HEMOSTATIC AGENTS (NO CHARGE) OPTIME
TOPICAL | Status: DC | PRN
  Administered 2015-12-02: 1 via TOPICAL

## 2015-12-02 MED ORDER — FENTANYL CITRATE (PF) 100 MCG/2ML IJ SOLN
INTRAMUSCULAR | Status: DC | PRN
  Administered 2015-12-02: 50 ug via INTRAVENOUS
  Administered 2015-12-02: 100 ug via INTRAVENOUS
  Administered 2015-12-02 (×2): 50 ug via INTRAVENOUS

## 2015-12-02 MED ORDER — METHOCARBAMOL 500 MG PO TABS
ORAL_TABLET | ORAL | Status: AC
  Filled 2015-12-02: qty 1

## 2015-12-02 MED ORDER — LIDOCAINE HCL (CARDIAC) 20 MG/ML IV SOLN
INTRAVENOUS | Status: DC | PRN
  Administered 2015-12-02: 60 mg via INTRAVENOUS

## 2015-12-02 MED ORDER — 0.9 % SODIUM CHLORIDE (POUR BTL) OPTIME
TOPICAL | Status: DC | PRN
Start: 2015-12-02 — End: 2015-12-02
  Administered 2015-12-02: 1000 mL

## 2015-12-02 MED ORDER — LISINOPRIL 2.5 MG PO TABS
2.5000 mg | ORAL_TABLET | Freq: Every day | ORAL | Status: DC
  Administered 2015-12-03 – 2015-12-05 (×3): 2.5 mg via ORAL
  Filled 2015-12-02 (×3): qty 1

## 2015-12-02 MED ORDER — ACETAMINOPHEN 325 MG PO TABS: 650.0000 mg | ORAL_TABLET | ORAL | Status: DC | PRN

## 2015-12-02 MED ORDER — ONDANSETRON HCL 4 MG/2ML IJ SOLN
INTRAMUSCULAR | Status: DC | PRN
  Administered 2015-12-02: 4 mg via INTRAVENOUS

## 2015-12-02 MED ORDER — METHOCARBAMOL 500 MG PO TABS
500.0000 mg | ORAL_TABLET | Freq: Four times a day (QID) | ORAL | Status: DC | PRN
  Administered 2015-12-02 – 2015-12-04 (×5): 500 mg via ORAL
  Filled 2015-12-02 (×4): qty 1

## 2015-12-02 MED ORDER — SODIUM CHLORIDE 0.9% FLUSH
3.0000 mL | Freq: Two times a day (BID) | INTRAVENOUS | Status: DC
  Administered 2015-12-02 – 2015-12-05 (×4): 3 mL via INTRAVENOUS

## 2015-12-02 MED ORDER — MIDAZOLAM HCL 2 MG/2ML IJ SOLN
INTRAMUSCULAR | Status: AC
  Filled 2015-12-02: qty 2

## 2015-12-02 MED ORDER — POTASSIUM CHLORIDE IN NACL 20-0.9 MEQ/L-% IV SOLN
INTRAVENOUS | Status: DC
  Filled 2015-12-02 (×7): qty 1000

## 2015-12-02 MED ORDER — SCOPOLAMINE 1 MG/3DAYS TD PT72
MEDICATED_PATCH | TRANSDERMAL | Status: AC
  Administered 2015-12-02: 1 via TRANSDERMAL
  Filled 2015-12-02: qty 1

## 2015-12-02 MED ORDER — ROCURONIUM BROMIDE 100 MG/10ML IV SOLN
INTRAVENOUS | Status: DC | PRN
  Administered 2015-12-02: 35 mg via INTRAVENOUS

## 2015-12-02 MED ORDER — THROMBIN 5000 UNITS EX SOLR
CUTANEOUS | Status: DC | PRN
Start: 2015-12-02 — End: 2015-12-02
  Administered 2015-12-02 (×3): 5000 [IU] via TOPICAL

## 2015-12-02 MED ORDER — PHENYLEPHRINE HCL 10 MG/ML IJ SOLN
10.0000 mg | INTRAVENOUS | Status: DC | PRN
  Administered 2015-12-02: 25 ug/min via INTRAVENOUS

## 2015-12-02 MED ORDER — MIDAZOLAM HCL 5 MG/5ML IJ SOLN
INTRAMUSCULAR | Status: DC | PRN
  Administered 2015-12-02 (×2): 1 mg via INTRAVENOUS

## 2015-12-02 MED ORDER — PROPOFOL 10 MG/ML IV BOLUS
INTRAVENOUS | Status: AC
  Filled 2015-12-02: qty 20

## 2015-12-02 MED ORDER — HYDROCODONE-ACETAMINOPHEN 5-325 MG PO TABS
1.0000 | ORAL_TABLET | ORAL | Status: DC | PRN
  Administered 2015-12-02: 1 via ORAL
  Administered 2015-12-02: 2 via ORAL
  Administered 2015-12-03 – 2015-12-04 (×2): 1 via ORAL
  Administered 2015-12-04: 2 via ORAL
  Administered 2015-12-04 (×2): 1 via ORAL
  Administered 2015-12-05: 2 via ORAL
  Filled 2015-12-02: qty 1
  Filled 2015-12-02: qty 2
  Filled 2015-12-02 (×3): qty 1
  Filled 2015-12-02: qty 2
  Filled 2015-12-02: qty 1
  Filled 2015-12-02: qty 2

## 2015-12-02 MED ORDER — LACTATED RINGERS IV SOLN: INTRAVENOUS | Status: DC

## 2015-12-02 MED ORDER — SODIUM CHLORIDE 0.9% FLUSH: 3.0000 mL | INTRAVENOUS | Status: DC | PRN

## 2015-12-02 MED ORDER — LIDOCAINE 2% (20 MG/ML) 5 ML SYRINGE
INTRAMUSCULAR | Status: AC
  Filled 2015-12-02: qty 5

## 2015-12-02 MED ORDER — ACETAMINOPHEN 650 MG RE SUPP: 650.0000 mg | RECTAL | Status: DC | PRN

## 2015-12-02 MED ORDER — GABAPENTIN 300 MG PO CAPS
300.0000 mg | ORAL_CAPSULE | Freq: Two times a day (BID) | ORAL | Status: DC | PRN
  Administered 2015-12-03: 300 mg via ORAL
  Filled 2015-12-02: qty 1

## 2015-12-02 MED ORDER — SUGAMMADEX SODIUM 200 MG/2ML IV SOLN
INTRAVENOUS | Status: DC | PRN
  Administered 2015-12-02: 150 mg via INTRAVENOUS

## 2015-12-02 MED ORDER — MENTHOL 3 MG MT LOZG
1.0000 | LOZENGE | OROMUCOSAL | Status: DC | PRN
  Filled 2015-12-02: qty 9

## 2015-12-02 MED ORDER — SODIUM CHLORIDE 0.9 % IR SOLN
Status: DC | PRN
  Administered 2015-12-02: 07:00:00

## 2015-12-02 MED ORDER — CEFAZOLIN SODIUM 1-5 GM-% IV SOLN
1.0000 g | Freq: Three times a day (TID) | INTRAVENOUS | Status: AC
  Administered 2015-12-02 (×2): 1 g via INTRAVENOUS
  Filled 2015-12-02 (×2): qty 50

## 2015-12-02 MED ORDER — LACTATED RINGERS IV SOLN
INTRAVENOUS | Status: DC | PRN
  Administered 2015-12-02 (×2): via INTRAVENOUS

## 2015-12-02 SURGICAL SUPPLY — 47 items
APL SKNCLS STERI-STRIP NONHPOA (GAUZE/BANDAGES/DRESSINGS) ×1
BAG DECANTER FOR FLEXI CONT (MISCELLANEOUS) ×3 IMPLANT
BENZOIN TINCTURE PRP APPL 2/3 (GAUZE/BANDAGES/DRESSINGS) ×3 IMPLANT
BUR MATCHSTICK NEURO 3.0 LAGG (BURR) ×3 IMPLANT
CAGE COROENT SM 6X13X15 (Cage) ×2 IMPLANT
CANISTER SUCT 3000ML PPV (MISCELLANEOUS) ×3 IMPLANT
CLOSURE WOUND 1/2 X4 (GAUZE/BANDAGES/DRESSINGS) ×1
DRAPE C-ARM 42X72 X-RAY (DRAPES) ×6 IMPLANT
DRAPE LAPAROTOMY 100X72 PEDS (DRAPES) ×3 IMPLANT
DRAPE MICROSCOPE LEICA (MISCELLANEOUS) ×3 IMPLANT
DRAPE POUCH INSTRU U-SHP 10X18 (DRAPES) ×3 IMPLANT
DURAPREP 6ML APPLICATOR 50/CS (WOUND CARE) ×3 IMPLANT
ELECT COATED BLADE 2.86 ST (ELECTRODE) ×3 IMPLANT
ELECT REM PT RETURN 9FT ADLT (ELECTROSURGICAL) ×3
ELECTRODE REM PT RTRN 9FT ADLT (ELECTROSURGICAL) ×1 IMPLANT
GAUZE SPONGE 4X4 16PLY XRAY LF (GAUZE/BANDAGES/DRESSINGS) IMPLANT
GLOVE BIO SURGEON STRL SZ8 (GLOVE) ×3 IMPLANT
GLOVE BIOGEL PI IND STRL 6.5 (GLOVE) IMPLANT
GLOVE BIOGEL PI INDICATOR 6.5 (GLOVE) ×6
GLOVE INDICATOR 7.0 STRL GRN (GLOVE) ×4 IMPLANT
GLOVE INDICATOR 7.5 STRL GRN (GLOVE) ×4 IMPLANT
GOWN STRL REUS W/ TWL LRG LVL3 (GOWN DISPOSABLE) IMPLANT
GOWN STRL REUS W/ TWL XL LVL3 (GOWN DISPOSABLE) IMPLANT
GOWN STRL REUS W/TWL 2XL LVL3 (GOWN DISPOSABLE) ×3 IMPLANT
GOWN STRL REUS W/TWL LRG LVL3 (GOWN DISPOSABLE) ×3
GOWN STRL REUS W/TWL XL LVL3 (GOWN DISPOSABLE)
HEMOSTAT POWDER KIT SURGIFOAM (HEMOSTASIS) ×3 IMPLANT
KIT BASIN OR (CUSTOM PROCEDURE TRAY) ×3 IMPLANT
KIT ROOM TURNOVER OR (KITS) ×3 IMPLANT
NDL HYPO 25X1 1.5 SAFETY (NEEDLE) ×1 IMPLANT
NDL SPNL 20GX3.5 QUINCKE YW (NEEDLE) ×1 IMPLANT
NEEDLE HYPO 25X1 1.5 SAFETY (NEEDLE) ×3 IMPLANT
NEEDLE SPNL 20GX3.5 QUINCKE YW (NEEDLE) ×3 IMPLANT
NS IRRIG 1000ML POUR BTL (IV SOLUTION) ×3 IMPLANT
PACK LAMINECTOMY NEURO (CUSTOM PROCEDURE TRAY) ×3 IMPLANT
PAD ARMBOARD 7.5X6 YLW CONV (MISCELLANEOUS) ×3 IMPLANT
PLATE ARCHON 1-LEVEL 22MM (Plate) ×2 IMPLANT
RUBBERBAND STERILE (MISCELLANEOUS) ×6 IMPLANT
SCREW ARCHON SELFTAP 4.0X13 (Screw) ×8 IMPLANT
SPONGE INTESTINAL PEANUT (DISPOSABLE) ×3 IMPLANT
SPONGE SURGIFOAM ABS GEL SZ50 (HEMOSTASIS) ×3 IMPLANT
STRIP CLOSURE SKIN 1/2X4 (GAUZE/BANDAGES/DRESSINGS) ×2 IMPLANT
SUT VIC AB 3-0 SH 8-18 (SUTURE) ×3 IMPLANT
TOWEL OR 17X24 6PK STRL BLUE (TOWEL DISPOSABLE) ×3 IMPLANT
TOWEL OR 17X26 10 PK STRL BLUE (TOWEL DISPOSABLE) ×3 IMPLANT
TRAP SPECIMEN MUCOUS 40CC (MISCELLANEOUS) IMPLANT
WATER STERILE IRR 1000ML POUR (IV SOLUTION) ×3 IMPLANT

## 2015-12-02 NOTE — Anesthesia Procedure Notes (Signed)
Procedure Name: Intubation Date/Time: 12/02/2015 7:38 AM Performed by: Rebekah Chesterfield L Pre-anesthesia Checklist: Patient identified, Emergency Drugs available, Suction available and Patient being monitored Patient Re-evaluated:Patient Re-evaluated prior to inductionOxygen Delivery Method: Circle System Utilized Preoxygenation: Pre-oxygenation with 100% oxygen Intubation Type: IV induction Ventilation: Mask ventilation without difficulty Laryngoscope Size: Mac and 3 Grade View: Grade II Tube type: Oral Tube size: 7.0 mm Number of attempts: 1 Airway Equipment and Method: Stylet Placement Confirmation: ETT inserted through vocal cords under direct vision,  positive ETCO2 and breath sounds checked- equal and bilateral Secured at: 20 cm Tube secured with: Tape Dental Injury: Teeth and Oropharynx as per pre-operative assessment

## 2015-12-02 NOTE — Anesthesia Postprocedure Evaluation (Signed)
Anesthesia Post Note  Patient: Joann Buchanan  Procedure(s) Performed: Procedure(s) (LRB): ANTERIOR CERVICAL DECOMPRESSION/DISCECTOMY FUSION CERVICAL FOUR-FIVE (N/A)  Patient location during evaluation: PACU Anesthesia Type: General Level of consciousness: awake and alert Pain management: pain level controlled Vital Signs Assessment: post-procedure vital signs reviewed and stable Respiratory status: spontaneous breathing, nonlabored ventilation, respiratory function stable and patient connected to nasal cannula oxygen Cardiovascular status: blood pressure returned to baseline and stable Postop Assessment: no signs of nausea or vomiting Anesthetic complications: no    Last Vitals:  Filed Vitals:   12/02/15 0945 12/02/15 1000  BP:  137/76  Pulse: 95 92  Temp:    Resp: 40 12    Last Pain:  Filed Vitals:   12/02/15 1022  PainSc: Asleep                 Zekiel Torian L

## 2015-12-02 NOTE — H&P (Signed)
Subjective:   Patient is a 70 y.o. female admitted for myelopathy. The patient first presented to me with complaints of neck pain, numbness of the arm(s) and loss of strength of the arm(s). Onset of symptoms was several months ago. The pain is described as aching and occurs all day. The pain is rated moderate, and is located  In the neck and radiates to the arms with NT. The symptoms have been progressive. Symptoms are exacerbated by extending head backwards, and are relieved by none.  Previous work up includes MRI of cervical spine, results: spinal stenosis.  Past Medical History  Diagnosis Date  . Allergic rhinitis   . GERD (gastroesophageal reflux disease)   . HTN (hypertension)   . Melanoma ([place])   . Migraine   . Hx of fibrocystic disease of breast   . Osteoarthritis of hand   . Goiter     ?   Marland Kitchen PONV (postoperative nausea and vomiting)   . Numbness and tingling     bilateral hands    Past Surgical History  Procedure Laterality Date  . Appendectomy    . Partial hysterectomy      Ovaries intact  . Tubal ligation    . Esophagogastroduodenoscopy      normal-24 hour PH probe (-)  . Emg      left upper arm- neg  . Dexa  7/07    negative  . Eye surgery  10/09    Left eye; for double vision  . Breast biopsy      normal  . Cataract extraction Right Jan 2017    Allergies  Allergen Reactions  . Sulfonamide Derivatives Other (See Comments)    REACTION: unspecified    Social History  Substance Use Topics  . Smoking status: Never Smoker   . Smokeless tobacco: Never Used  . Alcohol Use: No    Family History  Problem Relation Age of Onset  . Other Father     Died, 26 - tree fell on him  . Breast cancer Mother     Died, 60  . Breast cancer Sister     Died, 17  . Heart disease Sister     Died, 18  . Healthy Son   . Healthy Daughter    Prior to Admission medications   Medication Sig Start Date End Date Taking? Authorizing Provider  acetaminophen (TYLENOL) 650 MG CR  tablet Take 650 mg by mouth daily as needed for pain. Reported on 07/06/2015   Yes Historical Provider, MD  Aspirin-Salicylamide-Caffeine (BC HEADACHE) 325-95-16 MG TABS Take 1 packet by mouth daily as needed (for headache).   Yes Historical Provider, MD  gabapentin (NEURONTIN) 300 MG capsule Start 1 tablet at bedtime for 3 days, then increase to 1 tablet twice daily. Patient taking differently: Take 300 mg by mouth 2 (two) times daily as needed (for pain).  10/25/15  Yes Donika K Patel, DO  lisinopril (PRINIVIL,ZESTRIL) 2.5 MG tablet Take 1 tablet (2.5 mg total) by mouth daily. 04/05/15  Yes Abner Greenspan, MD  omeprazole (PRILOSEC) 40 MG capsule Take 40 mg by mouth daily.   Yes Historical Provider, MD  Polyethyl Glycol-Propyl Glycol (SYSTANE OP) Apply 1 drop to eye daily as needed (for dry eyes).    Yes Historical Provider, MD  vitamin C (ASCORBIC ACID) 500 MG tablet Take 500 mg by mouth daily.   Yes Historical Provider, MD  ALPRAZolam (XANAX) 0.25 MG tablet Take 1 tablet (0.25 mg total) by mouth as needed. Prior  to flights 06/16/15   Abner Greenspan, MD  Multiple Vitamins-Minerals (ALIVE WOMENS 50+ PO) Take 1 tablet by mouth daily.     Historical Provider, MD  SUMAtriptan (IMITREX) 20 MG/ACT nasal spray USE 1 SPRAY INTO THE NOSE EVERY 2 HOURS AS NEEDED FOR MIGRAINE. NO MORE THAN 2 DOSES IN 24 HOURS 10/05/15   Abner Greenspan, MD     Review of Systems  Positive ROS: neg  All other systems have been reviewed and were otherwise negative with the exception of those mentioned in the HPI and as above.  Objective: Vital signs in last 24 hours: Temp:  [98.1 F (36.7 C)] 98.1 F (36.7 C) (05/26 0605) Pulse Rate:  [81] 81 (05/26 0605) Resp:  [18] 18 (05/26 0605) BP: (146)/(83) 146/83 mmHg (05/26 0605) SpO2:  [98 %] 98 % (05/26 0605) Weight:  [63.447 kg (139 lb 14 oz)] 63.447 kg (139 lb 14 oz) (05/26 0631)  General Appearance: Alert, cooperative, no distress, appears stated age Head: Normocephalic,  without obvious abnormality, atraumatic Eyes: PERRL, conjunctiva/corneas clear, EOM's intact      Neck: Supple, symmetrical, trachea midline, Back: Symmetric, no curvature, ROM normal, no CVA tenderness Lungs:  respirations unlabored Heart: Regular rate and rhythm Abdomen: Soft, non-tender Extremities: Extremities normal, atraumatic, no cyanosis or edema Pulses: 2+ and symmetric all extremities Skin: Skin color, texture, turgor normal, no rashes or lesions  NEUROLOGIC:  Mental status: Alert and oriented x4, no aphasia, good attention span, fund of knowledge and memory  Motor Exam - grossly normal Sensory Exam - grossly normal Reflexes: 1+ Coordination - grossly normal Gait - grossly normal Balance - grossly normal Cranial Nerves: I: smell Not tested  II: visual acuity  OS: nl    OD: nl  II: visual fields Full to confrontation  II: pupils Equal, round, reactive to light  III,VII: ptosis None  III,IV,VI: extraocular muscles  Full ROM  V: mastication Normal  V: facial light touch sensation  Normal  V,VII: corneal reflex  Present  VII: facial muscle function - upper  Normal  VII: facial muscle function - lower Normal  VIII: hearing Not tested  IX: soft palate elevation  Normal  IX,X: gag reflex Present  XI: trapezius strength  5/5  XI: sternocleidomastoid strength 5/5  XI: neck flexion strength  5/5  XII: tongue strength  Normal    Data Review Lab Results  Component Value Date   WBC 6.1 11/23/2015   HGB 13.1 11/23/2015   HCT 39.7 11/23/2015   MCV 89.2 11/23/2015   PLT 393 11/23/2015   Lab Results  Component Value Date   NA 142 11/23/2015   K 4.3 11/23/2015   CL 106 11/23/2015   CO2 23 11/23/2015   BUN 13 11/23/2015   CREATININE 1.02* 11/23/2015   GLUCOSE 102* 11/23/2015   Lab Results  Component Value Date   INR 1.05 11/23/2015    Assessment:   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 Patient has failed  conservative therapy. Planned surgery : acdf C4-5  Plan:   I explained the condition and procedure to the patient and answered any questions.  Patient wishes to proceed with procedure as planned. Understands risks/ benefits/ and expected or typical outcomes.  Rosalinda Seaman S 12/02/2015 7:10 AM

## 2015-12-02 NOTE — Op Note (Signed)
12/02/2015  9:01 AM  PATIENT:  Joann Buchanan  70 y.o. 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 Decompressive anterior cervical discectomy C4-5, 2. Anterior cervical arthrodesis C4-5 utilizing a peek interbody cage packed with morcellized autograft, 3. Anterior cervical plating C4-5 utilizing a nuvasive archon plate  SURGEON:  Sherley Bounds, MD  ASSISTANTS: none  ANESTHESIA:   General  EBL: 100 ml  Total I/O In: 1250 [I.V.:1250] Out: 100 [Blood:100]  BLOOD ADMINISTERED:none  DRAINS: none   SPECIMEN:  No Specimen  INDICATION FOR PROCEDURE: This patient presented with neck pain with bilateral hand numbness and tingling and difficulty with gait. She was found to be myelopathic on exam. She had an MRI which showed significant spinal stenosis at C4-5. I recommended decompressive ACDF with plating. Patient understood the risks, benefits, and alternatives and potential outcomes and wished to proceed.  PROCEDURE DETAILS: Patient was brought to the operating room placed under general endotracheal anesthesia. Patient was placed in the supine position on the operating room table. The neck was prepped with Duraprep and draped in a sterile fashion.   Three cc of local anesthesia was injected and a transverse incision was made on the right side of the neck.  Dissection was carried down thru the subcutaneous tissue and the platysma was  elevated, opened, and undermined with Metzenbaum scissors.  Dissection was then carried out thru an avascular plane leaving the sternocleidomastoid carotid artery and jugular vein laterally and the trachea and esophagus medially. The ventral aspect of the vertebral column was identified and a localizing x-ray was taken. The C4-5 level was identified. The longus colli muscles were then elevated and the retractor was placed. The annulus was incised and the disc space  entered. Discectomy was performed with micro-curettes and pituitary rongeurs. I then used the high-speed drill to drill the endplates down to the level of the posterior longitudinal ligament. The drill shavings were saved in a mucous trap for later arthrodesis. The operating microscope was draped and brought into the field provided additional magnification, illumination and visualization. Discectomy was continued posteriorly thru the disc space. Posterior longitudinal ligament was opened with a nerve hook, and then removed along with disc herniation and osteophytes, decompressing the spinal canal and thecal sac. We then continued to remove osteophytic overgrowth and disc material decompressing the neural foramina and exiting nerve roots bilaterally. The scope was angled up and down to help decompress and undercut the vertebral bodies. Once the decompression was completed we could pass a nerve hook circumferentially to assure adequate decompression in the midline and in the neural foramina. So by both visualization and palpation we felt we had an adequate decompression of the neural elements. We then measured the height of the intravertebral disc space and selected a 6 millimeter Peek interbody cage packed with autograft and morcellized allograft. It was then gently positioned in the intravertebral disc space and countersunk. I then used a 22 mm plate and placed four variable angle screws into the vertebral bodies and locked them into position. The wound was irrigated with bacitracin solution, checked for hemostasis which was established and confirmed. Once meticulous hemostasis was achieved, we then proceeded with closure. The platysma was closed with interrupted 3-0 undyed Vicryl suture, the subcuticular layer was closed with interrupted 3-0 undyed Vicryl suture. The skin edges were approximated with steristrips. The drapes were removed. A sterile dressing was applied. The patient was then awakened from general  anesthesia and transferred to the recovery room in stable condition. At the end of the procedure all sponge, needle and instrument counts were correct.   PLAN OF CARE: Admit for overnight observation  PATIENT DISPOSITION:  PACU - hemodynamically stable.   Delay start of Pharmacological VTE agent (>24hrs) due to surgical blood loss or risk of bleeding:  yes

## 2015-12-02 NOTE — Transfer of Care (Signed)
Immediate Anesthesia Transfer of Care Note  Patient: Joann Buchanan  Procedure(s) Performed: Procedure(s): ANTERIOR CERVICAL DECOMPRESSION/DISCECTOMY FUSION CERVICAL FOUR-FIVE (N/A)  Patient Location: PACU  Anesthesia Type:General  Level of Consciousness: awake, alert , oriented and patient cooperative  Airway & Oxygen Therapy: Patient Spontanous Breathing and Patient connected to nasal cannula oxygen  Post-op Assessment: Report given to RN, Post -op Vital signs reviewed and stable and Patient moving all extremities  Post vital signs: Reviewed and stable  Last Vitals:  Filed Vitals:   12/02/15 0605  BP: 146/83  Pulse: 81  Temp: 36.7 C  Resp: 18    Last Pain:  Filed Vitals:   12/02/15 0631  PainSc: 4       Patients Stated Pain Goal: 7 ([phone] [id number])  Complications: No apparent anesthesia complications

## 2015-12-03 DIAGNOSIS — M50021 Cervical disc disorder at C4-C5 level with myelopathy: Secondary | ICD-10-CM | POA: Diagnosis not present

## 2015-12-03 NOTE — Progress Notes (Signed)
Filed Vitals:   12/02/15 1544 12/02/15 2100 12/03/15 0106 12/03/15 0615  BP: 144/84 122/62 109/54 110/66  Pulse: 102 91 76 82  Temp: 97.3 F (36.3 C) 98.5 F (36.9 C) 98.7 F (37.1 C) 98 F (36.7 C)  TempSrc:  Oral Oral Oral  Resp: 18 18 18 18   Height:      Weight:      SpO2: 98% 94% 95% 98%    Patient sitting up, and also ambulating in the halls. Mild dysphagia. Dressing clean and dry, but mild swelling underneath the incision. Neck soft, no dyspnea.  Encouraged further ambulation, as well as sleeping upright and sitting up.  Plan: Have recommended we continue to monitor her cause of the swelling beneath the incision. Have recommended soft foods and liquids because of dysphagia  Hosie Spangle, MD 12/03/2015, 9:28 AM

## 2015-12-04 DIAGNOSIS — M50021 Cervical disc disorder at C4-C5 level with myelopathy: Secondary | ICD-10-CM | POA: Diagnosis not present

## 2015-12-04 MED ORDER — DEXAMETHASONE SODIUM PHOSPHATE 4 MG/ML IJ SOLN
10.0000 mg | Freq: Once | INTRAMUSCULAR | Status: AC
  Administered 2015-12-04: 10 mg via INTRAVENOUS
  Filled 2015-12-04: qty 3

## 2015-12-04 MED ORDER — DEXAMETHASONE SODIUM PHOSPHATE 4 MG/ML IJ SOLN
4.0000 mg | Freq: Four times a day (QID) | INTRAMUSCULAR | Status: DC
Start: 2015-12-04 — End: 2015-12-05
  Administered 2015-12-04 – 2015-12-05 (×5): 4 mg via INTRAVENOUS
  Filled 2015-12-04 (×5): qty 1

## 2015-12-04 NOTE — Progress Notes (Signed)
Filed Vitals:   12/03/15 1727 12/03/15 2142 12/04/15 0146 12/04/15 0654  BP: 146/63 129/74 116/64 117/71  Pulse: 106 108 93 86  Temp: 99.6 F (37.6 C) 98.3 F (36.8 C) 99.3 F (37.4 C) 98.8 F (37.1 C)  TempSrc: Oral Oral Oral Oral  Resp: 18 18 16 16   Height:      Weight:      SpO2: 97% 98% 95% 95%    Patient sitting up in the chair, continuing to have difficulties with dysphagia. Dressing clean and dry, but moderate swelling beneath the incision, a little bit more indurated, but no dyspnea. Patient and her daughter note that she had had difficulties with dysphagia prior to surgery, and has had esophageal dilations. She has been up and ambulating in the halls.  Plan: Have recommended that we begin Decadron IV, with an initial dose of 10 mg, then 4 mg IV every 6 hours. Encouraged to continue to ambulate in the halls. We'll continue to monitor.  Hosie Spangle, MD 12/04/2015, 8:43 AM

## 2015-12-05 ENCOUNTER — Encounter (HOSPITAL_COMMUNITY): Payer: Self-pay | Admitting: Neurological Surgery

## 2015-12-05 DIAGNOSIS — M50021 Cervical disc disorder at C4-C5 level with myelopathy: Secondary | ICD-10-CM | POA: Diagnosis not present

## 2015-12-05 MED ORDER — HYDROCODONE-ACETAMINOPHEN 5-325 MG PO TABS: 1.0000 | ORAL_TABLET | ORAL | Status: DC | PRN

## 2015-12-05 NOTE — Progress Notes (Signed)
Pt discharged at this time.  Her incision has steri-strips and is open to air.  She denies any pain and is able to swallow better today.  She has all of her belongings with her.  Collar on.  Pt and spouse verbalize understanding of discharge instructions including making follow-up appointment in 1 week.  She has her discharge papers and prescriptions for decadron taper and vicodin with her.  Discharging with spouse via private vehicle.

## 2015-12-05 NOTE — Discharge Summary (Signed)
Physician Discharge Summary  Patient ID: Joann Buchanan MRN: [id number] DOB/AGE: 68-Mar-1947 68 y.o.  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 Sherley Bounds performed a C4-5 ACDF. Postoperatively she had a moderate amount of swelling underneath the incision. With associated dysphagia, but no dyspnea. Started on Decadron yesterday with significant symptomatically improvement. Swelling is lessened today, and dysphagia is improved. Patient feels improved and stable to be discharged to home, and she's been given discharge instructions regarding wound care and activities along with her husband. He has been given a written prescription for Decadron 4 mg tablets with instructions take 1 tablet by mouth 3 times a day today and tomorrow, 1 tablet twice a day on May 31, half of a tablet twice a day on June 1, and half a tablet in the morning on June 2, then she is to discontinue the medication. We prescribed 10 tablets and no refills. I've asked her to return in one week for follow-up with Dr. Ronnald Ramp.  Discharge Exam: Blood pressure 123/74, pulse 88, temperature 98 F (36.7 C), temperature source Oral, resp. rate 18, height 5\' 1"  (1.549 m), weight 63.447 kg (139 lb 14 oz), SpO2 96 %.  Disposition: Home    Medication List    TAKE these medications        acetaminophen 650 MG CR tablet  Commonly known as:  TYLENOL  Take 650 mg by mouth daily as needed for pain. Reported on 07/06/2015     ALIVE WOMENS 50+ PO  Take 1 tablet by mouth daily.     ALPRAZolam 0.25 MG tablet  Commonly known as:  XANAX  Take 1 tablet (0.25 mg total) by mouth as needed. Prior to flights     [facility] HEADACHE 325-95-16 MG Tabs  Generic  drug:  Aspirin-Salicylamide-Caffeine  Take 1 packet by mouth daily as needed (for headache).     gabapentin 300 MG capsule  Commonly known as:  NEURONTIN  Start 1 tablet at bedtime for 3 days, then increase to 1 tablet twice daily.     HYDROcodone-acetaminophen 5-325 MG tablet  Commonly known as:  NORCO/VICODIN  Take 1-2 tablets by mouth every 4 (four) hours as needed (mild pain).     lisinopril 2.5 MG tablet  Commonly known as:  PRINIVIL,ZESTRIL  Take 1 tablet (2.5 mg total) by mouth daily.     omeprazole 40 MG capsule  Commonly known as:  PRILOSEC  Take 40 mg by mouth daily.     SUMAtriptan 20 MG/ACT nasal spray  Commonly known as:  IMITREX  USE 1 SPRAY INTO THE NOSE EVERY 2 HOURS AS NEEDED FOR MIGRAINE. NO MORE THAN 2 DOSES IN 24 HOURS     SYSTANE OP  Apply 1 drop to eye daily as needed (for dry eyes).     vitamin C 500 MG tablet  Commonly known as:  ASCORBIC ACID  Take 500 mg by mouth daily.         SignedHosie Spangle 12/05/2015, 9:48 AM

## 2015-12-05 NOTE — Care Management Note (Signed)
Case Management Note  Patient Details  Name: IDELLE BOEDEKER MRN: [id number] Date of Birth: [birth year]  Subjective/Objective:    Pt underwent: ANTERIOR CERVICAL DECOMPRESSION/DISCECTOMY FUSION CERVICAL FOUR-FIVE. She is from home with her spouse.         Action/Plan: CM following for d/c needs.   Expected Discharge Date:                  Expected Discharge Plan:  Home/Self Care  In-House Referral:     Discharge planning Services     Post Acute Care Choice:    Choice offered to:     DME Arranged:    DME Agency:     HH Arranged:    HH Agency:     Status of Service:  In process, will continue to follow  Medicare Important Message Given:    Date Medicare IM Given:    Medicare IM give by:    Date Additional Medicare IM Given:    Additional Medicare Important Message give by:     If discussed at [place] of Stay Meetings, dates discussed:    Additional Comments:  Pollie Friar, RN 12/05/2015, 9:27 AM

## 2015-12-05 NOTE — Care Management Note (Signed)
Case Management Note  Patient Details  Name: [name] MRN: [id number] Date of Birth: 03-May-1946  Subjective/Objective:                    Action/Plan: Pt discharging home with self care. No further needs per CM.   Expected Discharge Date:                  Expected Discharge Plan:  Home/Self Care  In-House Referral:     Discharge planning Services     Post Acute Care Choice:    Choice offered to:     DME Arranged:    DME Agency:     HH Arranged:    [place] Agency:     Status of Service:  Completed, signed off  Medicare Important Message Given:    Date Medicare IM Given:    Medicare IM give by:    Date Additional Medicare IM Given:    Additional Medicare Important Message give by:     If discussed at [place] of Stay Meetings, dates discussed:    Additional Comments:  Pollie Friar, RN 12/05/2015, 10:09 AM

## 2015-12-28 ENCOUNTER — Encounter: Payer: Self-pay | Admitting: Neurology

## 2015-12-28 ENCOUNTER — Ambulatory Visit (INDEPENDENT_AMBULATORY_CARE_PROVIDER_SITE_OTHER): Payer: Medicare Other | Admitting: Neurology

## 2015-12-28 VITALS — BP 148/80 | HR 112 | Ht 60.0 in | Wt 135.4 lb

## 2015-12-28 DIAGNOSIS — G9520 Unspecified cord compression: Secondary | ICD-10-CM | POA: Diagnosis not present

## 2015-12-28 DIAGNOSIS — Z981 Arthrodesis status: Secondary | ICD-10-CM | POA: Diagnosis not present

## 2015-12-28 NOTE — Progress Notes (Signed)
Follow-up Visit   Date: 12/28/2015   Joann Buchanan MRN: [id number] DOB: 1945-10-19   Interim History: Joann Buchanan is a 70 y.o. right-handed Caucasian female with GERD, HTN, and migraines returning to the clinic for new complaints of bilateral hand paresthesias.  The patient was accompanied to the clinic by self.  History of present illness Initial visit 04/10/2013 for headaches:   Migraines started in her late 79s and stopped in her 62s. She was previously on propranolol and antidepressant, but she did not tolerate it. They started again intermittently over the past 10-15 years and have become worse over the past two months. Headaches are located over center of the forehead, described pounding, and radiates towards the neck. It usually starts in the afternoon and if she is able to sleep it off, will be better by the morning. If she unable to sleep, then she is unable to focus and feels like a "hangover". Frequency is twice per month. She has noticed that onions trigger her symptoms. There is associated photophobia, phonophobia, nausea, and vomiting. She has visual auras after the headache, described wavy lines and blurry vision and there is occasional tingling over the left side of her face. She takes imitrex nasal spray and takes it twice per month which works. She takes motrin or Aleve 3-4 times per week and used flexiril for the past two nights which has helped.   Around the end of July 2014, she developed a new headache at the base of her headache, characterized as pressure-like stinging sensation, as if her hair is being pulled. This headache occurs in the morning and has been constant. She was administered greater occipital nerve block which alleviated symptoms.    UPDATE 10/10/2015: Starting around early March, she began noticing tingling and numbness over the tips of her fingers which gradually progressed to involve her palm and dorsum of the hand within a week.  Currently, she  has constant paresthesias involving both hands to the level of the wrists. She is more careful when lifting objects and fine motor tasks such as putting on make up or jewelry. There are no identifiable factors the improve to exacerbate symptoms.  She does not have any neck pain or similar paresthesias of the feet.  She is not diabetic, alcoholic, or thyroid disease.      Headaches are much improved, she only takes imitrex twice per month.   UPDATE 12/28/2015:  She underwent cervical fusion for cervical myelopathy due to disc protrusion at C4-5 in May and has noticed that hand paresthesias are slightly better.  This morning, she noticed that her right hand was not tingling, which was an improvement.   Her gait has improved.  She complains of dysphagia and choking spells since the surgery. She feels pressure against her esophagus when swallowing.     Medications:  Current Outpatient Prescriptions on File Prior to Visit  Medication Sig Dispense Refill  . acetaminophen (TYLENOL) 650 MG CR tablet Take 650 mg by mouth daily as needed for pain. Reported on 07/06/2015    . ALPRAZolam (XANAX) 0.25 MG tablet Take 1 tablet (0.25 mg total) by mouth as needed. Prior to flights 30 tablet 0  . Aspirin-Salicylamide-Caffeine (BC HEADACHE) 325-95-16 MG TABS Take 1 packet by mouth daily as needed (for headache).    . gabapentin (NEURONTIN) 300 MG capsule Start 1 tablet at bedtime for 3 days, then increase to 1 tablet twice daily. (Patient taking differently: Take 300 mg by mouth 2 (two)  times daily as needed (for pain). ) 60 capsule 5  . HYDROcodone-acetaminophen (NORCO/VICODIN) 5-325 MG tablet Take 1-2 tablets by mouth every 4 (four) hours as needed (mild pain). 40 tablet 0  . lisinopril (PRINIVIL,ZESTRIL) 2.5 MG tablet Take 1 tablet (2.5 mg total) by mouth daily. 90 tablet 3  . Multiple Vitamins-Minerals (ALIVE WOMENS 50+ PO) Take 1 tablet by mouth daily.     Marland Kitchen omeprazole (PRILOSEC) 40 MG capsule Take 40 mg by mouth  daily.    Vladimir Faster Glycol-Propyl Glycol (SYSTANE OP) Apply 1 drop to eye daily as needed (for dry eyes).     . SUMAtriptan (IMITREX) 20 MG/ACT nasal spray USE 1 SPRAY INTO THE NOSE EVERY 2 HOURS AS NEEDED FOR MIGRAINE. NO MORE THAN 2 DOSES IN 24 HOURS 1 Inhaler 3  . vitamin C (ASCORBIC ACID) 500 MG tablet Take 500 mg by mouth daily.     No current facility-administered medications on file prior to visit.    Allergies:  Allergies  Allergen Reactions  . Sulfonamide Derivatives Other (See Comments)    REACTION: unspecified    Review of Systems:  CONSTITUTIONAL: No fevers, chills, night sweats, or weight loss.  EYES: No visual changes or eye pain ENT: No hearing changes.  No history of nose bleeds.   RESPIRATORY: No cough, wheezing and shortness of breath.   CARDIOVASCULAR: Negative for chest pain, and palpitations.   GI: Negative for abdominal discomfort, blood in stools or black stools.  No recent change in bowel habits.   GU:  No history of incontinence.   MUSCLOSKELETAL: No history of joint pain or swelling.  No myalgias.   SKIN: Negative for lesions, rash, and itching.   ENDOCRINE: Negative for cold or heat intolerance, polydipsia or goiter.   PSYCH:  No depression or anxiety symptoms.   NEURO: As Above.   Vital Signs:  BP 148/80 mmHg  Pulse 112  Ht 5' (1.524 m)  Wt 135 lb 7 oz (61.434 kg)  BMI 26.45 kg/m2  SpO2 96% Pain Scale: 0 on a scale of 0-10  Neurological Exam: MENTAL STATUS including orientation to time, place, person, recent and remote memory, attention span and concentration, language, and fund of knowledge is normal.  Speech is not dysarthric.  CRANIAL NERVES:  Normal fundoscopic exam.  No visual field defects. Pupils equal round and reactive to light.  Normal conjugate, extra-ocular eye movements in all directions of gaze.  No ptosis. Normal facial sensation.  Face is symmetric. Palate elevates symmetrically.  Tongue is midline.  MOTOR:  Motor strength is  5/5 in all extremities, except mild intrinsic hand weakness 5-/5 (L >R).  No atrophy, fasciculations or abnormal movements.  No pronator drift.  Tone is normal.    MSRs:  Right                                                                 Left brachioradialis 2+  brachioradialis 2+  biceps 2+  biceps 3+  triceps 2+  triceps 2+  patellar 3+  patellar 3+  ankle jerk 2+  ankle jerk 2+  Hoffman no  Hoffman yes   SENSORY:  Temperature is reduced over the dorsum and palmer surfaces of the hand bilaterally to the wrist. Pin prick, light touch, and  vibration intact.  Lhermitte's sign negative.  Negative Tinel's sign at the wrist on the right.  Positive Tinel's at the wrist on the left.  COORDINATION/GAIT:    Gait narrow based and stable.   Data: Lab Results  Component Value Date   TSH 2.03 09/28/2015   MRI cervical spine wwo contrast 10/25/2015:   1. Moderate-sized right paracentral disc protrusion at C4-5 likely accounting for the patient's right radicular symptoms. 2. 6 mm intraspinal lesion just below the C2-3 disc space level most consistent with a small meningioma. No mass effect at this time. Routine surveillance is suggested in 1 year.   IMPRESSION/PLAN: 1.  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 Ronnald Ramp in May 2017  - Clinically with slowly improving bilateral hand paresthesias, no weakness or worsening symptoms  - She does complain of dysphagia since the surgery and I suspect this will improve with time, encouraged soft diet for now  - She is scheduled to see Dr. Ronnald Ramp on July 7th  - Continue gabapentin 300mg  twice daily for now, if stable for the next 2-3 months, reduce to 300mg  at bedtime  2.  Episodic migraines, well controlled and occuring only 1-2 times per month.  Continue imitrex as needed, but limit to twice per week.  Return to clinic in 6 months   The duration of this appointment visit was 20 minutes of  face-to-face time with the patient.  Greater than 50% of this time was spent in counseling, explanation of diagnosis, planning of further management, and coordination of care.   Thank you for allowing me to participate in patient's care.  If I can answer any additional questions, I would be pleased to do so.    Sincerely,    Donika K. Posey Pronto, DO

## 2015-12-28 NOTE — Patient Instructions (Signed)
Continue gabapentin 300mg  twice daily for now, if stable for the next 2-3 months, reduce to 300mg  at bedtime Return to clinic in 6 months

## 2016-02-13 ENCOUNTER — Telehealth: Payer: Self-pay | Admitting: Family Medicine

## 2016-02-13 NOTE — Telephone Encounter (Signed)
Patient Name: Joann Buchanan DOB: Nov 25, 1945 Initial Comment Caller says, from knees to her hips she is covered with mosquito bites, itching uncontrollably Nurse Assessment Nurse: Ronnald Ramp, RN, [place] Date/Time (Eastern Time): 02/13/2016 12:50:13 PM Confirm and document reason for call. If symptomatic, describe symptoms. You must click the next button to save text entered. ---Spoke with pt's husband. He states the pt has mosquito bites on her upper legs on Saturday. Has the patient traveled out of the country within the last 30 days? ---Not Applicable Does the patient have any new or worsening symptoms? ---Yes Will a triage be completed? ---Yes Related visit to physician within the last 2 weeks? ---No Does the PT have any chronic conditions? (i.e. diabetes, asthma, etc.) ---Unknown Is this a behavioral health or substance abuse call? ---No Guidelines Guideline Title Affirmed Question Affirmed Notes Mosquito Bite [1] SEVERE local itching (i.e., interferes with work, school, sleep) AND [2] not improved after 24 hours of hydrocortisone cream Final Disposition User See PCP When Office is Open (within 3 days) Ronnald Ramp, RN, [employer] is not with the pt now. Gave home care advice and told him to have the pt call back if no relief an appt can be scheduled at that time.   Disagree/Comply: Comply

## 2016-03-28 ENCOUNTER — Encounter: Payer: Self-pay | Admitting: Family Medicine

## 2016-04-06 ENCOUNTER — Telehealth: Payer: Self-pay | Admitting: Pulmonary Disease

## 2016-04-06 NOTE — Telephone Encounter (Signed)
Calling with questions about Spouse's most recent OV. Nothing to put in this message.  Nothing further needed.

## 2016-06-05 ENCOUNTER — Observation Stay
Admission: EM | Admit: 2016-06-05 | Discharge: 2016-06-07 | Disposition: A | Discharge status: Home / Self Care | Payer: Medicare Other | Attending: Internal Medicine | Admitting: Internal Medicine

## 2016-06-05 ENCOUNTER — Emergency Department: Payer: Medicare Other

## 2016-06-05 DIAGNOSIS — R079 Chest pain, unspecified: Secondary | ICD-10-CM | POA: Diagnosis present

## 2016-06-05 DIAGNOSIS — K219 Gastro-esophageal reflux disease without esophagitis: Secondary | ICD-10-CM | POA: Insufficient documentation

## 2016-06-05 DIAGNOSIS — Z882 Allergy status to sulfonamides status: Secondary | ICD-10-CM | POA: Insufficient documentation

## 2016-06-05 DIAGNOSIS — F419 Anxiety disorder, unspecified: Secondary | ICD-10-CM | POA: Insufficient documentation

## 2016-06-05 DIAGNOSIS — Z79899 Other long term (current) drug therapy: Secondary | ICD-10-CM | POA: Insufficient documentation

## 2016-06-05 DIAGNOSIS — Z85820 Personal history of malignant melanoma of skin: Secondary | ICD-10-CM | POA: Diagnosis not present

## 2016-06-05 DIAGNOSIS — G43909 Migraine, unspecified, not intractable, without status migrainosus: Secondary | ICD-10-CM | POA: Insufficient documentation

## 2016-06-05 DIAGNOSIS — R0789 Other chest pain: Secondary | ICD-10-CM | POA: Diagnosis not present

## 2016-06-05 DIAGNOSIS — I10 Essential (primary) hypertension: Secondary | ICD-10-CM | POA: Diagnosis not present

## 2016-06-05 LAB — BASIC METABOLIC PANEL
ANION GAP: 9 (ref 5–15)
BUN: 18 mg/dL (ref 6–20)
CHLORIDE: 109 mmol/L (ref 101–111)
CO2: 23 mmol/L (ref 22–32)
Calcium: 10 mg/dL (ref 8.9–10.3)
Creatinine, Ser: 0.97 mg/dL (ref 0.44–1.00)
GFR calc non Af Amer: 59 mL/min — ABNORMAL LOW (ref >60)
Glucose, Bld: 126 mg/dL — ABNORMAL HIGH (ref 65–99)
POTASSIUM: 3.8 mmol/L (ref 3.5–5.1)
SODIUM: 141 mmol/L (ref 135–145)

## 2016-06-05 LAB — CBC
HCT: 38.8 % (ref 35.0–47.0)
HEMOGLOBIN: 13.3 g/dL (ref 12.0–16.0)
MCH: 29.8 pg (ref 26.0–34.0)
MCHC: 34.1 g/dL (ref 32.0–36.0)
MCV: 87.2 fL (ref 80.0–100.0)
Platelets: 374 10*3/uL (ref 150–440)
RBC: 4.45 MIL/uL (ref 3.80–5.20)
RDW: 13.8 % (ref 11.5–14.5)
WBC: 7.9 10*3/uL (ref 3.6–11.0)

## 2016-06-05 LAB — TROPONIN I

## 2016-06-05 MED ORDER — NITROGLYCERIN 0.4 MG SL SUBL
0.4000 mg | SUBLINGUAL_TABLET | SUBLINGUAL | Status: DC | PRN
  Administered 2016-06-05 (×2): 0.4 mg via SUBLINGUAL
  Filled 2016-06-05 (×2): qty 1

## 2016-06-05 NOTE — ED Triage Notes (Signed)
Pt reports sudden onset of chest tightness and SOB, no prior hx. Pt hypertensive

## 2016-06-05 NOTE — ED Notes (Signed)
Patient transported to X-ray 

## 2016-06-05 NOTE — ED Provider Notes (Signed)
[facility] Emergency Department Provider Note  ____________________________________________  Time seen: Approximately 10:31 PM  I have reviewed the triage vital signs and the nursing notes.   HISTORY  Chief Complaint Shortness of Breath and Chest Pain    HPI Joann Buchanan is a 70 y.o. female with a history of hypertension not currently on medication, GERD, presenting for chest pain and shortness breath. The patient was walking out of the movie theater prior to arrival when she began to have a nonradiating chest tightness across the front of her chest with associated shortness of breath. She did not have any associated diaphoresis, palpitations, nausea or vomiting, lightheadedness or fainting. At this time, her breathing has improved to having some mild tightness.   Past Medical History:  Diagnosis Date  . Allergic rhinitis   . GERD (gastroesophageal reflux disease)   . Goiter    ?   Marland Kitchen HTN (hypertension)   . Hx of fibrocystic disease of breast   . Melanoma ([place])   . Migraine   . Numbness and tingling    bilateral hands  . Osteoarthritis of hand   . PONV (postoperative nausea and vomiting)     Patient Active Problem List   Diagnosis Date Noted  . Chest pain, rule out acute myocardial infarction 06/05/2016  . S/P cervical spinal fusion 12/02/2015  . Osteopenia 10/25/2015  . Estrogen deficiency 10/05/2015  . Routine general medical examination at a health care facility 09/25/2015  . Acute sinusitis 07/06/2015  . Caregiver stress 04/06/2015  . Hand tingling 04/05/2015  . Otitis media of left ear 08/18/2014  . Viral URI 08/18/2014  . Encounter for Medicare annual wellness exam 03/03/2013  . FOLLICULITIS 0000000  . OSTEOARTHRITIS, HANDS, BILATERAL 11/01/2009  . Menopausal symptoms 03/23/2009  . MELANOMA, MALIGNANT, TRUNK 02/04/2007  . THYROMEGALY 02/04/2007  . Migraine 02/04/2007  . Essential hypertension 02/04/2007  . ALLERGIC RHINITIS  02/04/2007  . GERD 02/04/2007    Past Surgical History:  Procedure Laterality Date  . ANTERIOR CERVICAL DECOMP/DISCECTOMY FUSION N/A 12/02/2015   Procedure: ANTERIOR CERVICAL DECOMPRESSION/DISCECTOMY FUSION CERVICAL FOUR-FIVE;  Surgeon: Eustace Moore, MD;  Location: [facility];  Service: Neurosurgery;  Laterality: N/A;  . APPENDECTOMY    . BREAST BIOPSY     normal  . CATARACT EXTRACTION Right Jan 2017  . DEXA  7/07   negative  . EMG     left upper arm- neg  . ESOPHAGOGASTRODUODENOSCOPY     normal-24 hour PH probe (-)  . EYE SURGERY  10/09   Left eye; for double vision  . PARTIAL HYSTERECTOMY     Ovaries intact  . TUBAL LIGATION      Current Outpatient Rx  . Order #: [id number] Class: Historical Med  . Order #: [id number] Class: Normal  . Order #: [id number] Class: Historical Med  . Order #: [id number] Class: Phone In  . Order #: [id number] Class: Historical Med  . Order #: [id number] Class: Normal  . Order #: [id number] Class: Print  . Order #: [id number] Class: Historical Med  . Order #: [id number] Class: Normal  . Order #: [id number] Class: Historical Med  . Order #: [id number] Class: Historical Med  . Order #: [id number] Class: Historical Med    Allergies Sulfonamide derivatives  Family History  Problem Relation Age of Onset  . Breast cancer Mother     Died, 74  . Other Father     Died, 53 - tree fell on him  . Breast cancer Sister     Died, 51  . Heart disease  Sister     Died, 27  . Healthy Son   . Healthy Daughter     Social History Social History  Substance Use Topics  . Smoking status: Never Smoker  . Smokeless tobacco: Never Used  . Alcohol use No    Review of Systems Constitutional: No fever/chills.No lightheadedness or syncope. Eyes: No visual changes. ENT: No sore throat. No congestion or rhinorrhea. Cardiovascular: Positive chest tightness. Denies palpitations. Respiratory: Positive shortness of breath.  No cough. Gastrointestinal: No abdominal pain.  No  nausea, no vomiting.  No diarrhea.  No constipation. Genitourinary: Negative for dysuria. Musculoskeletal: Negative for back pain. Positive bilateral foot swelling after a long day standing yesterday that is symmetric. No leg swelling or calf pain.  Skin: Negative for rash. Neurological: Negative for headaches. No focal numbness, tingling or weakness.   10-point ROS otherwise negative.  ____________________________________________   PHYSICAL EXAM:  VITAL SIGNS: ED Triage Vitals  Enc Vitals Group     BP 06/05/16 2208 (!) 190/112     Pulse Rate 06/05/16 2207 93     Resp 06/05/16 2207 20     Temp 06/05/16 2207 97.5 F (36.4 C)     Temp src --      SpO2 06/05/16 2207 100 %     Weight 06/05/16 2207 138 lb (62.6 kg)     Height 06/05/16 2207 5' (1.524 m)     Head Circumference --      Peak Flow --      Pain Score --      Pain Loc --      Pain Edu? --      Excl. in [place]? --     Constitutional: Alert and oriented. Well appearing and in no acute distress. Answers questions appropriately. Eyes: Conjunctivae are normal.  EOMI. No scleral icterus. Head: Atraumatic. Nose: No congestion/rhinnorhea. Mouth/Throat: Mucous membranes are moist.  Neck: No stridor.  Supple.  No JVD. Cardiovascular: Normal rate, regular rhythm. No murmurs, rubs or gallops.  Respiratory: Normal respiratory effort.  No accessory muscle use or retractions. Lungs CTAB.  No wheezes, rales or ronchi. Gastrointestinal: Soft, nontender and nondistended.  No guarding or rebound.  No peritoneal signs. Musculoskeletal: No LE edema. No ttp in the calves or palpable cords.  Negative Homan's sign. I do not appreciate any edema in her feet. Neurologic:  A&Ox3.  Speech is clear.  Face and smile are symmetric.  EOMI.  Moves all extremities well. Skin:  Skin is warm, dry and intact. No rash noted. Psychiatric: Mood and affect are normal. Speech and behavior are normal.  Normal  judgement.  ____________________________________________   LABS (all labs ordered are listed, but only abnormal results are displayed)  Labs Reviewed  BASIC METABOLIC PANEL - Abnormal; Notable for the following:       Result Value   Glucose, Bld 126 (*)    GFR calc non Af Amer 59 (*)    All other components within normal limits  LIPID PANEL - Abnormal; Notable for the following:    Triglycerides 234 (*)    VLDL 47 (*)    All other components within normal limits  CBC  TROPONIN I  BASIC METABOLIC PANEL  CBC  TSH  TROPONIN I  TROPONIN I  TROPONIN I   ____________________________________________  EKG  ED ECG REPORT I, Eula Listen, the attending physician, personally viewed and interpreted this ECG.   Date: 06/05/2016  EKG Time: 2217  Rate: 88  Rhythm: normal sinus rhythm  Axis: normal  Intervals:none  ST&T Change: Nonspecific T-wave inversion in V1. No ST elevation or other ischemic changes.  ____________________________________________  RADIOLOGY  No results found.  ____________________________________________   PROCEDURES  Procedure(s) performed: None  Procedures  Critical Care performed: No ____________________________________________   INITIAL IMPRESSION / ASSESSMENT AND PLAN / ED COURSE  Pertinent labs & imaging results that were available during my care of the patient were reviewed by me and considered in my medical decision making (see chart for details).  70 y.o. female with a history of hypertension, GERD, presenting with exertional chest pain associated with shortness of breath. The patient has not had any recent risk stratification studies. Here she is markedly hypertensive although on my repeat now that she is in the bed and her symptoms have improved his in the 160s over 90s. We will look for evidence of acute MI, arrhythmia, but also consider other causes including gastrointestinal manifestations. There is no tachycardia, pleuritic  component to the chest pain, hypoxia, recent travel history, or evidence of DVT so PE is much less likely. The patient has early received aspirin at the movie theater, and I will treat her pain with nitroglycerin. This should also help her hypertension.  Anticipate admission for cardiac rule out and risk stratification study.  ____________________________________________  FINAL CLINICAL IMPRESSION(S) / ED DIAGNOSES  Final diagnoses:  Chest pain with moderate risk for cardiac etiology    Clinical Course       NEW MEDICATIONS STARTED DURING THIS VISIT:  Discharge Medication List as of 06/07/2016  2:46 PM        Eula Listen, MD 06/11/16 1557

## 2016-06-06 ENCOUNTER — Observation Stay: Payer: Medicare Other

## 2016-06-06 ENCOUNTER — Encounter: Payer: Self-pay | Admitting: Emergency Medicine

## 2016-06-06 DIAGNOSIS — K210 Gastro-esophageal reflux disease with esophagitis: Secondary | ICD-10-CM

## 2016-06-06 DIAGNOSIS — R0789 Other chest pain: Secondary | ICD-10-CM | POA: Diagnosis not present

## 2016-06-06 DIAGNOSIS — I10 Essential (primary) hypertension: Secondary | ICD-10-CM

## 2016-06-06 DIAGNOSIS — R079 Chest pain, unspecified: Secondary | ICD-10-CM

## 2016-06-06 LAB — BASIC METABOLIC PANEL
Anion gap: 5 (ref 5–15)
BUN: 17 mg/dL (ref 6–20)
CALCIUM: 9.5 mg/dL (ref 8.9–10.3)
CO2: 25 mmol/L (ref 22–32)
CREATININE: 0.85 mg/dL (ref 0.44–1.00)
Chloride: 111 mmol/L (ref 101–111)
GFR calc non Af Amer: >60 mL/min (ref >60)
Glucose, Bld: 94 mg/dL (ref 65–99)
Potassium: 3.7 mmol/L (ref 3.5–5.1)
Sodium: 141 mmol/L (ref 135–145)

## 2016-06-06 LAB — CBC
HCT: 37 % (ref 35.0–47.0)
Hemoglobin: 12.9 g/dL (ref 12.0–16.0)
MCH: 31.2 pg (ref 26.0–34.0)
MCHC: 34.9 g/dL (ref 32.0–36.0)
MCV: 89.4 fL (ref 80.0–100.0)
PLATELETS: 366 10*3/uL (ref 150–440)
RBC: 4.14 MIL/uL (ref 3.80–5.20)
RDW: 13.7 % (ref 11.5–14.5)
WBC: 6.9 10*3/uL (ref 3.6–11.0)

## 2016-06-06 LAB — LIPID PANEL
CHOL/HDL RATIO: 3.4 ratio
CHOLESTEROL: 175 mg/dL (ref 0–200)
HDL: 52 mg/dL (ref >40)
LDL Cholesterol: 76 mg/dL (ref 0–99)
Triglycerides: 234 mg/dL — ABNORMAL HIGH (ref <150)
VLDL: 47 mg/dL — ABNORMAL HIGH (ref 0–40)

## 2016-06-06 LAB — TROPONIN I

## 2016-06-06 LAB — TSH: TSH: 3.681 u[IU]/mL (ref 0.350–4.500)

## 2016-06-06 MED ORDER — SODIUM CHLORIDE 0.9% FLUSH
3.0000 mL | Freq: Two times a day (BID) | INTRAVENOUS | Status: DC
  Administered 2016-06-06 – 2016-06-07 (×3): 3 mL via INTRAVENOUS

## 2016-06-06 MED ORDER — MORPHINE SULFATE (PF) 2 MG/ML IV SOLN: 1.0000 mg | INTRAVENOUS | Status: DC | PRN

## 2016-06-06 MED ORDER — SODIUM CHLORIDE 0.9 % IV SOLN
INTRAVENOUS | Status: DC
  Administered 2016-06-06: 02:00:00 via INTRAVENOUS

## 2016-06-06 MED ORDER — ALUM & MAG HYDROXIDE-SIMETH 200-200-20 MG/5ML PO SUSP
15.0000 mL | Freq: Four times a day (QID) | ORAL | Status: DC | PRN
  Administered 2016-06-06: 15 mL via ORAL
  Filled 2016-06-06: qty 30

## 2016-06-06 MED ORDER — PANTOPRAZOLE SODIUM 40 MG PO TBEC
40.0000 mg | DELAYED_RELEASE_TABLET | Freq: Every day | ORAL | Status: DC
  Administered 2016-06-07: 40 mg via ORAL
  Filled 2016-06-06 (×2): qty 1

## 2016-06-06 MED ORDER — METOPROLOL TARTRATE 25 MG PO TABS
12.5000 mg | ORAL_TABLET | Freq: Two times a day (BID) | ORAL | Status: DC
  Administered 2016-06-06 – 2016-06-07 (×4): 12.5 mg via ORAL
  Filled 2016-06-06 (×4): qty 1

## 2016-06-06 MED ORDER — BISACODYL 5 MG PO TBEC
5.0000 mg | DELAYED_RELEASE_TABLET | Freq: Every day | ORAL | Status: DC | PRN
Start: 2016-06-06 — End: 2016-06-07

## 2016-06-06 MED ORDER — ACETAMINOPHEN 325 MG PO TABS
650.0000 mg | ORAL_TABLET | Freq: Four times a day (QID) | ORAL | Status: DC | PRN
  Administered 2016-06-06: 650 mg via ORAL
  Filled 2016-06-06: qty 2

## 2016-06-06 MED ORDER — ASPIRIN 325 MG PO TABS
325.0000 mg | ORAL_TABLET | Freq: Once | ORAL | Status: AC
  Administered 2016-06-06: 325 mg via ORAL
  Filled 2016-06-06: qty 1

## 2016-06-06 MED ORDER — NITROGLYCERIN 0.4 MG SL SUBL: 0.4000 mg | SUBLINGUAL_TABLET | SUBLINGUAL | Status: DC | PRN

## 2016-06-06 MED ORDER — ENOXAPARIN SODIUM 40 MG/0.4ML ~~LOC~~ SOLN
40.0000 mg | SUBCUTANEOUS | Status: DC
  Administered 2016-06-06: 40 mg via SUBCUTANEOUS
  Filled 2016-06-06: qty 0.4

## 2016-06-06 MED ORDER — ZOLPIDEM TARTRATE 5 MG PO TABS: 5.0000 mg | ORAL_TABLET | Freq: Every evening | ORAL | Status: DC | PRN

## 2016-06-06 MED ORDER — MAGNESIUM CITRATE PO SOLN
1.0000 | Freq: Once | ORAL | Status: DC | PRN
  Filled 2016-06-06: qty 296

## 2016-06-06 MED ORDER — MORPHINE SULFATE (PF) 4 MG/ML IV SOLN: 1.0000 mg | INTRAVENOUS | Status: DC | PRN

## 2016-06-06 MED ORDER — SUMATRIPTAN 20 MG/ACT NA SOLN: 20.0000 mg | Freq: Once | NASAL | Status: DC | PRN

## 2016-06-06 MED ORDER — VITAMIN C 500 MG PO TABS
500.0000 mg | ORAL_TABLET | Freq: Every day | ORAL | Status: DC
  Administered 2016-06-07: 500 mg via ORAL
  Filled 2016-06-06 (×2): qty 1

## 2016-06-06 MED ORDER — ACETAMINOPHEN 650 MG RE SUPP: 650.0000 mg | Freq: Four times a day (QID) | RECTAL | Status: DC | PRN

## 2016-06-06 MED ORDER — POLYVINYL ALCOHOL 1.4 % OP SOLN
1.0000 [drp] | Freq: Every day | OPHTHALMIC | Status: DC | PRN
  Filled 2016-06-06: qty 15

## 2016-06-06 MED ORDER — ONDANSETRON HCL 4 MG PO TABS
4.0000 mg | ORAL_TABLET | Freq: Four times a day (QID) | ORAL | Status: DC | PRN
  Administered 2016-06-07: 4 mg via ORAL
  Filled 2016-06-06: qty 1

## 2016-06-06 MED ORDER — SENNOSIDES-DOCUSATE SODIUM 8.6-50 MG PO TABS: 1.0000 | ORAL_TABLET | Freq: Every evening | ORAL | Status: DC | PRN

## 2016-06-06 MED ORDER — ADULT MULTIVITAMIN W/MINERALS CH
1.0000 | ORAL_TABLET | Freq: Every day | ORAL | Status: DC
  Administered 2016-06-07: 1 via ORAL
  Filled 2016-06-06 (×2): qty 1

## 2016-06-06 MED ORDER — ASPIRIN EC 81 MG PO TBEC
81.0000 mg | DELAYED_RELEASE_TABLET | Freq: Every day | ORAL | Status: DC
  Administered 2016-06-06 – 2016-06-07 (×2): 81 mg via ORAL
  Filled 2016-06-06 (×2): qty 1

## 2016-06-06 MED ORDER — MORPHINE SULFATE 2 MG/ML IJ SOLN: 1.0000 mg | INTRAMUSCULAR | Status: DC | PRN

## 2016-06-06 MED ORDER — ONDANSETRON HCL 4 MG/2ML IJ SOLN: 4.0000 mg | Freq: Four times a day (QID) | INTRAMUSCULAR | Status: DC | PRN

## 2016-06-06 MED ORDER — POLYETHYL GLYCOL-PROPYL GLYCOL 0.4-0.3 % OP GEL: Freq: Every day | OPHTHALMIC | Status: DC | PRN

## 2016-06-06 MED ORDER — HYDROCODONE-ACETAMINOPHEN 5-325 MG PO TABS
1.0000 | ORAL_TABLET | ORAL | Status: DC | PRN
  Administered 2016-06-06 (×2): 1 via ORAL
  Administered 2016-06-07: 2 via ORAL
  Filled 2016-06-06 (×4): qty 1

## 2016-06-06 NOTE — H&P (Signed)
[place] @ [facility] Admission History and Physical Harvie Bridge, D.O.  ---------------------------------------------------------------------------------------------------------------------   PATIENT NAME: Joann Buchanan MR#: [id number] DATE OF BIRTH: July 21, 1945 DATE OF ADMISSION: 06/05/2016 PRIMARY CARE PHYSICIAN: Loura Pardon, MD  REQUESTING/REFERRING PHYSICIAN: ED Dr. Mariea Clonts  CHIEF COMPLAINT: Chief Complaint  Patient presents with  . Shortness of Breath  . Chest Pain    HISTORY OF PRESENT ILLNESS: Joann Buchanan is a 70 y.o. female with a known history of GERD, hypertension,, diet controlled, migraines presents to the emergency department for evaluation of  chest pain.  Patient was in a usual state of health until  this evening when she experienced a sudden onset of substernal, upper epigastric pressure associated with shortness of breath. She reports that she ate a hamburger prior to the onset of symptoms and also had some burping afterwards. Patient states that she has a history of indigestion and gastroesophageal reflux but the character or quality of this pain was different. She denies any associated nausea, vomiting, diaphoresis, palpitations, radiation of her pain. She states her symptoms resolved after taking nitroglycerin but then she developed significant shortness of breath when moving to take her chest x-ray.  Of note she has not had a cardiac workup..  Otherwise there has been no change in status. Patient has been taking medication as prescribed and there has been no recent change in medication or diet.  There has been no recent illness, travel or sick contacts.    Patient denies fevers/chills, weakness, dizziness, shortness of breath, N/V/C/D, abdominal pain, dysuria/frequency, changes in mental status.   PAST MEDICAL HISTORY: Past Medical History:  Diagnosis Date  . Allergic rhinitis   . GERD (gastroesophageal reflux disease)   . Goiter    ?   Marland Kitchen HTN  (hypertension)   . Hx of fibrocystic disease of breast   . Melanoma ([place])   . Migraine   . Numbness and tingling    bilateral hands  . Osteoarthritis of hand   . PONV (postoperative nausea and vomiting)       PAST SURGICAL HISTORY: Past Surgical History:  Procedure Laterality Date  . ANTERIOR CERVICAL DECOMP/DISCECTOMY FUSION N/A 12/02/2015   Procedure: ANTERIOR CERVICAL DECOMPRESSION/DISCECTOMY FUSION CERVICAL FOUR-FIVE;  Surgeon: Eustace Moore, MD;  Location: [facility];  Service: Neurosurgery;  Laterality: N/A;  . APPENDECTOMY    . BREAST BIOPSY     normal  . CATARACT EXTRACTION Right Jan 2017  . DEXA  7/07   negative  . EMG     left upper arm- neg  . ESOPHAGOGASTRODUODENOSCOPY     normal-24 hour PH probe (-)  . EYE SURGERY  10/09   Left eye; for double vision  . PARTIAL HYSTERECTOMY     Ovaries intact  . TUBAL LIGATION        SOCIAL HISTORY: Social History  Substance Use Topics  . Smoking status: Never Smoker  . Smokeless tobacco: Never Used  . Alcohol use No      FAMILY HISTORY: Family History  Problem Relation Age of Onset  . Breast cancer Mother     Died, 67  . Other Father     Died, 59 - tree fell on him  . Breast cancer Sister     Died, 46  . Heart disease Sister     Died, 30  . Healthy Son   . Healthy Daughter      MEDICATIONS AT HOME: Prior to Admission medications   Medication Sig Start Date End Date  Taking? Authorizing Provider  omeprazole (PRILOSEC) 40 MG capsule Take 40 mg by mouth daily.   Yes Historical Provider, MD  SUMAtriptan (IMITREX) 20 MG/ACT nasal spray USE 1 SPRAY INTO THE NOSE EVERY 2 HOURS AS NEEDED FOR MIGRAINE. NO MORE THAN 2 DOSES IN 24 HOURS 10/05/15  Yes Abner Greenspan, MD  acetaminophen (TYLENOL) 650 MG CR tablet Take 650 mg by mouth daily as needed for pain. Reported on 07/06/2015    Historical Provider, MD  ALPRAZolam Duanne Moron) 0.25 MG tablet Take 1 tablet (0.25 mg total) by mouth as needed. Prior to flights Patient not  taking: Reported on 06/05/2016 06/16/15   Abner Greenspan, MD  Aspirin-Salicylamide-Caffeine [facility] HEADACHE) [phone] MG TABS Take 1 packet by mouth daily as needed (for headache).    Historical Provider, MD  dexamethasone (DECADRON) 4 MG tablet START BY TAKING 1 TABLET 3 TIMES DAILY ON 5/29 AND 5/30 THEN DECREASE AS DIRECTED ON ATTACHED SHEET 12/05/15   Historical Provider, MD  gabapentin (NEURONTIN) 300 MG capsule Start 1 tablet at bedtime for 3 days, then increase to 1 tablet twice daily. Patient not taking: Reported on 06/05/2016 10/25/15   Alda Berthold, DO  HYDROcodone-acetaminophen (NORCO/VICODIN) 5-325 MG tablet Take 1-2 tablets by mouth every 4 (four) hours as needed (mild pain). Patient not taking: Reported on 06/05/2016 12/05/15   Jovita Gamma, MD  ibuprofen (GOODSENSE IBUPROFEN) 200 MG tablet 200 mg.    Historical Provider, MD  lisinopril (PRINIVIL,ZESTRIL) 2.5 MG tablet Take 1 tablet (2.5 mg total) by mouth daily. Patient not taking: Reported on 06/05/2016 04/05/15   Abner Greenspan, MD  Multiple Vitamins-Minerals (ALIVE WOMENS 50+ PO) Take 1 tablet by mouth daily.     Historical Provider, MD  Polyethyl Glycol-Propyl Glycol (SYSTANE OP) Apply 1 drop to eye daily as needed (for dry eyes).     Historical Provider, MD  vitamin C (ASCORBIC ACID) 500 MG tablet Take 500 mg by mouth daily.    Historical Provider, MD      DRUG ALLERGIES: Allergies  Allergen Reactions  . Sulfonamide Derivatives Anaphylaxis and Other (See Comments)    REACTION: unspecified Hives and swelling     REVIEW OF SYSTEMS: CONSTITUTIONAL: No fatigue, weakness, fever, chills, weight gain/loss, headache EYES: No blurry or double vision. ENT: No tinnitus, postnasal drip, redness or soreness of the oropharynx. RESPIRATORY: Positivedyspnea,  negativecough, wheeze, hemoptysis. CARDIOVASCULAR: Positive chest pain, negative orthopnea, palpitations, syncope. GASTROINTESTINAL: No nausea, vomiting, constipation, diarrhea,  abdominal pain. No hematemesis, melena or hematochezia. GENITOURINARY: No dysuria, frequency, hematuria. ENDOCRINE: No polyuria or nocturia. No heat or cold intolerance. HEMATOLOGY: No anemia, bruising, bleeding. INTEGUMENTARY: No rashes, ulcers, lesions. MUSCULOSKELETAL: No pain, arthritis, swelling, gout. NEUROLOGIC: No numbness, tingling, weakness or ataxia. No seizure-type activity. PSYCHIATRIC: No anxiety, depression, insomnia.  PHYSICAL EXAMINATION: VITAL SIGNS: Blood pressure (!) 145/73, pulse 83, temperature 98.1 F (36.7 C), temperature source Oral, resp. rate 18, height 5' (1.524 m), weight 62.6 kg (138 lb), SpO2 96 %.  GENERAL: 70 y.o.-year-old  white female patient, well-developed, well-nourished lying in the bed in no acute distress.  Pleasant and cooperative.  Mildly anxious. HEENT: Head atraumatic, normocephalic. Pupils equal, round, reactive to light and accommodation. No scleral icterus. Extraocular muscles intact. Oropharynx is clear. Mucus membranes moist. NECK: Supple, full range of motion. No JVD, no bruit heard. No cervical lymphadenopathy. CHEST: Normal breath sounds bilaterally. No wheezing, rales, rhonchi or crackles. No use of accessory muscles of respiration.  No reproducible chest wall tenderness.  CARDIOVASCULAR: S1,  S2 normal. No murmurs, rubs, or gallops appreciated. Cap refill <2 seconds. ABDOMEN: Soft, nontender, nondistended. No rebound, guarding, rigidity. Normoactive bowel sounds present in all four quadrants. No organomegaly or mass. EXTREMITIES:  No pedal edema, cyanosis, or clubbing. NEUROLOGIC: Cranial nerves II through XII are grossly intact with no focal sensorimotor deficit. Muscle strength 5/5 in all extremities. Sensation intact. Gait not checked. PSYCHIATRIC: The patient is alert and oriented x 3. Normal affect, mood, thought content. SKIN: Warm, dry, and intact without obvious rash, lesion, or ulcer.  LABORATORY PANEL:  CBC  Recent Labs Lab  06/05/16 2218  WBC 7.9  HGB 13.3  HCT 38.8  PLT 374   ----------------------------------------------------------------------------------------------------------------- Chemistries  Recent Labs Lab 06/05/16 2218  NA 141  K 3.8  CL 109  CO2 23  GLUCOSE 126*  BUN 18  CREATININE 0.97  CALCIUM 10.0   ------------------------------------------------------------------------------------------------------------------ Cardiac Enzymes  Recent Labs Lab 06/05/16 2218  TROPONINI <0.03   ------------------------------------------------------------------------------------------------------------------  RADIOLOGY: Dg Chest 2 View  Result Date: 06/05/2016 CLINICAL DATA:  Shortness of breath and chest tightness EXAM: CHEST  2 VIEW COMPARISON:  Chest radiograph 11/23/2015 FINDINGS: Cardiomediastinal contours are normal. No pneumothorax or pleural effusion. No focal airspace consolidation or pulmonary edema. Cervical spine fusion hardware noted. IMPRESSION: No focal airspace disease. Electronically Signed   By: Ulyses Jarred M.D.   On: 06/05/2016 23:10    EKG: Normal sinus rhythm at  88 bpm with normal axis and nonspecific ST-T wave changes.   IMPRESSION AND PLAN:  This is a 70 y.o. female with a history of  GERD, hypertension,, diet controlled, migraines now being admitted with:  1. Chest pain, rule out ACS. Patient's symptoms may have been related to hypertensive urgency as she was markedly hypertensive on arrival to the emergency department.   - Admit to observation with telemetry monitoring. - Trend troponins, check lipids and TSH. - Morphine, nitro, beta blocker, aspirin ordered.   - Cardiology consult requested.  -With history of hypertension for which is not currently medicated I have restarted beta blocker  2.  History of GERD-continue Prilosec 3.  History of anxiety-continue Xanax 4. History of migraines-continue Imitrex as needed.    Diet/Nutrition: Heart  healthy Fluids: HL DVT Px: Lovenox, SCDs and early ambulation Code Status: Full  All the records are reviewed and case discussed with ED provider. Management plans discussed with the patient and/or family who express understanding and agree with plan of care.   TOTAL TIME TAKING CARE OF THIS PATIENT: 60 minutes.   Taylor Spilde D.O. on 06/06/2016 at 1:47 AM Between 7am to 6pm - Pager - [phone] After 6pm go to [email or web address] - Proofreader [employer] [facility] [place] [facility] Office [phone] CC: Primary care physician; Loura Pardon, MD     Note: This dictation was prepared with Dragon dictation along with smaller phrase technology. Any transcriptional errors that result from this process are unintentional.

## 2016-06-06 NOTE — Consult Note (Signed)
Cardiology Consultation Note    Patient ID: AKEYLAH LEMOND, MRN: [id number], DOB/AGE: 1945/08/16 70 y.o. Admit date: 06/05/2016   Date of Consult: 06/06/2016 Primary Physician: Loura Pardon, MD Primary Cardiologist: None  Chief Complaint: Chest tightness Reason for Consultation: Chest tightness Requesting MD: Harvie Bridge, DO  HPI: ADINA MCMORRAN is a 70 y.o. female with history of hypertension (not current on medications), GERD, and migraine headaches, whom we have been asked to see due to chest tightness that began yesterday evening.  The patient reports that she was doing well until she developed sudden lower chest tightness that would radiate upwards to her neck. The symptoms began after she was walking out from a show yesterday evening around 10. She noted some accompanying dyspnea, with the sensation of having a hard time taking a deep breath. The maximal intensity of her chest tightness was 7/10. It resolved on its own after about 5-10 minutes but has returned in waves since that time. Her most recent episode was around 3:30 to 4 this morning. The patient reports a history of GERD, noting that her acid reflux typically feels different than what she experienced last night. The patient also reports that her neck has not felt quite right since ACDF in May. The patient received sublingual nitroglycerin yesterday in the emergency department with improvement in her pain. The patient was also started on low-dose metoprolol.  At this time, the patient reports feeling close to her baseline, though she still has a vague, constricting sensation in her chest. She denies a history of prior cardiovascular disease. The patient has had some vague discomfort in her chest with exertion for several months, but yesterday was the first time that she had significant tightness. Of note, the patient has a history of a hiatal hernia as well as distal esophageal stricture that was dilated in 2014.  Past Medical  History:  Diagnosis Date  . Allergic rhinitis   . GERD (gastroesophageal reflux disease)   . Goiter    ?   Marland Kitchen HTN (hypertension)   . Hx of fibrocystic disease of breast   . Melanoma ([place])   . Migraine   . Numbness and tingling    bilateral hands  . Osteoarthritis of hand   . PONV (postoperative nausea and vomiting)       Surgical History:  Past Surgical History:  Procedure Laterality Date  . ANTERIOR CERVICAL DECOMP/DISCECTOMY FUSION N/A 12/02/2015   Procedure: ANTERIOR CERVICAL DECOMPRESSION/DISCECTOMY FUSION CERVICAL FOUR-FIVE;  Surgeon: Eustace Moore, MD;  Location: [facility];  Service: Neurosurgery;  Laterality: N/A;  . APPENDECTOMY    . BREAST BIOPSY     normal  . CATARACT EXTRACTION Right Jan 2017  . DEXA  7/07   negative  . EMG     left upper arm- neg  . ESOPHAGOGASTRODUODENOSCOPY     normal-24 hour PH probe (-)  . EYE SURGERY  10/09   Left eye; for double vision  . PARTIAL HYSTERECTOMY     Ovaries intact  . TUBAL LIGATION       Home Meds: Prior to Admission medications   Medication Sig Start Date Zoya Sprecher Date Taking? Authorizing Provider  omeprazole (PRILOSEC) 40 MG capsule Take 40 mg by mouth daily.   Yes Historical Provider, MD  SUMAtriptan (IMITREX) 20 MG/ACT nasal spray USE 1 SPRAY INTO THE NOSE EVERY 2 HOURS AS NEEDED FOR MIGRAINE. NO MORE THAN 2 DOSES IN 24 HOURS 10/05/15  Yes Abner Greenspan, MD  acetaminophen (  TYLENOL) 650 MG CR tablet Take 650 mg by mouth daily as needed for pain. Reported on 07/06/2015    Historical Provider, MD  ALPRAZolam Duanne Moron) 0.25 MG tablet Take 1 tablet (0.25 mg total) by mouth as needed. Prior to flights Patient not taking: Reported on 06/05/2016 06/16/15   Abner Greenspan, MD  Aspirin-Salicylamide-Caffeine [facility] HEADACHE) [id number] MG TABS Take 1 packet by mouth daily as needed (for headache).    Historical Provider, MD  dexamethasone (DECADRON) 4 MG tablet START BY TAKING 1 TABLET 3 TIMES DAILY ON 5/29 AND 5/30 THEN DECREASE AS DIRECTED  ON ATTACHED SHEET 12/05/15   Historical Provider, MD  gabapentin (NEURONTIN) 300 MG capsule Start 1 tablet at bedtime for 3 days, then increase to 1 tablet twice daily. Patient not taking: Reported on 06/05/2016 10/25/15   Alda Berthold, DO  HYDROcodone-acetaminophen (NORCO/VICODIN) 5-325 MG tablet Take 1-2 tablets by mouth every 4 (four) hours as needed (mild pain). Patient not taking: Reported on 06/05/2016 12/05/15   Jovita Gamma, MD  ibuprofen (GOODSENSE IBUPROFEN) 200 MG tablet 200 mg.    Historical Provider, MD  lisinopril (PRINIVIL,ZESTRIL) 2.5 MG tablet Take 1 tablet (2.5 mg total) by mouth daily. Patient not taking: Reported on 06/05/2016 04/05/15   Abner Greenspan, MD  Multiple Vitamins-Minerals (ALIVE WOMENS 50+ PO) Take 1 tablet by mouth daily.     Historical Provider, MD  Polyethyl Glycol-Propyl Glycol (SYSTANE OP) Apply 1 drop to eye daily as needed (for dry eyes).     Historical Provider, MD  vitamin C (ASCORBIC ACID) 500 MG tablet Take 500 mg by mouth daily.    Historical Provider, MD    Inpatient Medications:  . aspirin EC  81 mg Oral Daily  . enoxaparin (LOVENOX) injection  40 mg Subcutaneous Q24H  . metoprolol tartrate  12.5 mg Oral BID  . multivitamin with minerals  1 tablet Oral Daily  . pantoprazole  40 mg Oral Daily  . sodium chloride flush  3 mL Intravenous Q12H  . vitamin C  500 mg Oral Daily   . sodium chloride 75 mL/hr at 06/06/16 0147    Allergies:  Allergies  Allergen Reactions  . Sulfonamide Derivatives Anaphylaxis and Other (See Comments)    REACTION: unspecified Hives and swelling    Social History   Social History  . Marital status: Married    Spouse name: N/A  . Number of children: 2  . Years of education: N/A   Occupational History  . Retired Unemployed   Social History Main Topics  . Smoking status: Never Smoker  . Smokeless tobacco: Never Used  . Alcohol use No  . Drug use: No  . Sexual activity: No   Other Topics Concern  . Not  on file   Social History Narrative   Lives with husband.  Retired Water quality scientist for [employer]   Married; 2 children; 3 grand children           Family History  Problem Relation Age of Onset  . Breast cancer Mother     Died, 35  . Other Father     Died, 20 - tree fell on him  . Breast cancer Sister     Died, 75  . Heart disease Sister     Died, 80  . Healthy Son   . Healthy Daughter      Review of Systems: A 12-system review of systems was performed and is negative except as noted in the HPI.  Labs:  Recent Labs  06/05/16 2218 06/06/16 0414  TROPONINI <0.03 <0.03   Lab Results  Component Value Date   WBC 6.9 06/06/2016   HGB 12.9 06/06/2016   HCT 37.0 06/06/2016   MCV 89.4 06/06/2016   PLT 366 06/06/2016    Recent Labs Lab 06/06/16 0414  NA 141  K 3.7  CL 111  CO2 25  BUN 17  CREATININE 0.85  CALCIUM 9.5  GLUCOSE 94   Lab Results  Component Value Date   CHOL 175 06/06/2016   HDL 52 06/06/2016   LDLCALC 76 06/06/2016   TRIG 234 (H) 06/06/2016   No results found for: DDIMER  Radiology/Studies:  Dg Chest 2 View  Result Date: 06/05/2016 CLINICAL DATA:  Shortness of breath and chest tightness EXAM: CHEST  2 VIEW COMPARISON:  Chest radiograph 11/23/2015 FINDINGS: Cardiomediastinal contours are normal. No pneumothorax or pleural effusion. No focal airspace consolidation or pulmonary edema. Cervical spine fusion hardware noted. IMPRESSION: No focal airspace disease. Electronically Signed   By: Ulyses Jarred M.D.   On: 06/05/2016 23:10    Wt Readings from Last 3 Encounters:  06/05/16 138 lb (62.6 kg)  12/28/15 135 lb 7 oz (61.4 kg)  12/02/15 139 lb 14 oz (63.4 kg)    EKG: Normal sinus rhythm without significant abnormalities (I have personally reviewed the tracing).  Physical Exam: Blood pressure (!) 152/84, pulse 80, temperature 98.4 F (36.9 C), temperature source Oral, resp. rate 18, height 5' (1.524 m), weight 138 lb (62.6 kg), SpO2  98 %. Body mass index is 26.95 kg/m. General: Well developed, well nourished, in no acute distress. She is accompanied by her husband. Head: Normocephalic, atraumatic, sclera non-icteric, no xanthomas, nares are without discharge.  Neck: Negative for carotid bruits. JVD not elevated. Lungs: Clear bilaterally to auscultation without wheezes, rales, or rhonchi. Breathing is unlabored. Heart: RRR with S1 S2. No murmurs, rubs, or gallops appreciated. Nondisplaced PMI. Abdomen: Soft, non-tender, non-distended with normoactive bowel sounds. No hepatomegaly. No rebound/guarding. No obvious abdominal masses. Msk:  Strength and tone appear normal for age. Extremities: No clubbing or cyanosis. No edema.  Distal pedal pulses are 2+ and equal bilaterally. Neuro: Alert and oriented X 3. No facial asymmetry. No focal deficit. Moves all extremities spontaneously. Psych:  Responds to questions appropriately with a normal affect.    Assessment and Plan  70 year old woman with history of hypertension (not currently on medical therapy), GERD, and migraine headaches, presenting with acute onset of chest tightness  Chest tightness Patient has both typical and atypical features of her chest pain. Her only cardiovascular risk factors are history of hypertension and age greater than 62. She continues to have a vague constricting feeling in her chest, though the tightness that first alarmed her last night has resolved. EKG on admission was unremarkable. Troponins have also been negative 2. We discussed further evaluation options, including myocardial perfusion stress test and cardiac catheterization, and have agreed to proceed with a noninvasive approach.  Plan for exercise myocardial perfusion stress test today.  Continue low-dose aspirin.  Continue low-dose metoprolol.  LDL noted to be 76 this morning. We will defer addition of statin pending stress test.  Hypertension Blood pressure mildly elevated on most  recent check and was quite high when the patient first presented to the ED. This certainly could have been contributing to some of her symptoms. The patient is currently on metoprolol tartrate 12.5 mg twice a day. She was previously prescribed lisinopril 2.5 mg daily, but has not  been taking this regularly.  Continue metoprolol tartrate 12.5 mg twice a day, which could be titrated upwards as needed for blood pressure control as heart rate allows.  GERD Patient has a history of acid reflux but notes symptoms that are typically different than what brought her to the emergency department yesterday. Esophageal spasm is certainly a possibility for her chest tightness yesterday evening. Should be noted that the patient also has a history of hiatal hernia and esophageal stricture that was dilated in 2014.  Continue PPI therapy  Signed, Harrell Gave Addiel Mccardle MD 06/06/2016, 8:34 AM Pager: [phone]

## 2016-06-06 NOTE — Progress Notes (Signed)
[place] [facility] [place] NAME: Joann Buchanan    MR#:  [id number]  DATE OF BIRTH:  31-May-1946  SUBJECTIVE: admitted  this morning for chest pain, troponins are negative for 2 times. Patient supposed to have stress test but she did have coffee so stress test could not be done. Patient is willing to stay until tomorrow.   CHIEF COMPLAINT:   Chief Complaint  Patient presents with  . Shortness of Breath  . Chest Pain    REVIEW OF SYSTEMS:   ROS CONSTITUTIONAL: No fever, fatigue or weakness.  EYES: No blurred or double vision.  EARS, NOSE, AND THROAT: No tinnitus or ear pain.  RESPIRATORY: No cough, shortness of breath, wheezing or hemoptysis.  CARDIOVASCULAR: No chest pain, orthopnea, edema.  GASTROINTESTINAL: No nausea, vomiting, diarrhea or abdominal pain.  GENITOURINARY: No dysuria, hematuria.  ENDOCRINE: No polyuria, nocturia,  HEMATOLOGY: No anemia, easy bruising or bleeding SKIN: No rash or lesion. MUSCULOSKELETAL: No joint pain or arthritis.   NEUROLOGIC: No tingling, numbness, weakness.  PSYCHIATRY: No anxiety or depression.   DRUG ALLERGIES:   Allergies  Allergen Reactions  . Sulfonamide Derivatives Anaphylaxis and Other (See Comments)    REACTION: unspecified Hives and swelling    VITALS:  Blood pressure (!) 152/84, pulse 80, temperature 98.4 F (36.9 C), temperature source Oral, resp. rate 18, height 5' (1.524 m), weight 62.6 kg (138 lb), SpO2 98 %.  PHYSICAL EXAMINATION:  GENERAL:  70 y.o.-year-old patient lying in the bed with no acute distress.  EYES: Pupils equal, round, reactive to light and accommodation. No scleral icterus. Extraocular muscles intact.  HEENT: Head atraumatic, normocephalic. Oropharynx and nasopharynx clear.  NECK:  Supple, no jugular venous distention. No thyroid enlargement, no tenderness.  LUNGS: Normal breath sounds bilaterally, no wheezing, rales,rhonchi or crepitation. No use of accessory  muscles of respiration.  CARDIOVASCULAR: S1, S2 normal. No murmurs, rubs, or gallops.  ABDOMEN: Soft, nontender, nondistended. Bowel sounds present. No organomegaly or mass.  EXTREMITIES: No pedal edema, cyanosis, or clubbing.  NEUROLOGIC: Cranial nerves II through XII are intact. Muscle strength 5/5 in all extremities. Sensation intact. Gait not checked.  PSYCHIATRIC: The patient is alert and oriented x 3.  SKIN: No obvious rash, lesion, or ulcer.    LABORATORY PANEL:   CBC  Recent Labs Lab 06/06/16 0414  WBC 6.9  HGB 12.9  HCT 37.0  PLT 366   ------------------------------------------------------------------------------------------------------------------  Chemistries   Recent Labs Lab 06/06/16 0414  NA 141  K 3.7  CL 111  CO2 25  GLUCOSE 94  BUN 17  CREATININE 0.85  CALCIUM 9.5   ------------------------------------------------------------------------------------------------------------------  Cardiac Enzymes  Recent Labs Lab 06/06/16 1030  TROPONINI <0.03   ------------------------------------------------------------------------------------------------------------------  RADIOLOGY:  Dg Chest 2 View  Result Date: 06/05/2016 CLINICAL DATA:  Shortness of breath and chest tightness EXAM: CHEST  2 VIEW COMPARISON:  Chest radiograph 11/23/2015 FINDINGS: Cardiomediastinal contours are normal. No pneumothorax or pleural effusion. No focal airspace consolidation or pulmonary edema. Cervical spine fusion hardware noted. IMPRESSION: No focal airspace disease. Electronically Signed   By: Ulyses Jarred M.D.   On: 06/05/2016 23:10    EKG:   Orders placed or performed during the hospital encounter of 06/05/16  . ED EKG  . ED EKG  . EKG 12-Lead  . EKG 12-Lead    ASSESSMENT AND PLAN:     48,.70 year old female patient with chest pain: Negative troponins 2 times. Patient supposed to have stress test,  but could not be done because  she had coffee this morning,  patient to have stress test tomorrow, likely discharge after that depending on the result. Continue beta blockers, aspirin, sublingual nitrates  #2 GERD, hiatal hernia: Continue PPIs #3 migraine headaches  Discussed with family, discussed with nurse All the records are reviewed and case discussed with Care Management/Social Workerr. Management plans discussed with the patient, family and they are in agreement.  CODE STATUS: full  TOTAL TIME TAKING CARE OF THIS PATIENT: 20minutes.   POSSIBLE D/C IN 1 DAY, DEPENDING ON CLINICAL CONDITION.   Epifanio Lesches M.D on 06/06/2016 at 11:35 AM  Between 7am to 6pm - Pager - [phone]  After 6pm go to [email or web address] - password [email or web address] [facility]  Office  [phone]  CC: Primary care physician; Loura Pardon, MD   Note: This dictation was prepared with Dragon dictation along with smaller phrase technology. Any transcriptional errors that result from this process are unintentional.

## 2016-06-06 NOTE — Progress Notes (Signed)
Patient is alert and oriented, vss, no complaints of pain.  Stress test schedule for tomorrow AM

## 2016-06-07 ENCOUNTER — Observation Stay (HOSPITAL_BASED_OUTPATIENT_CLINIC_OR_DEPARTMENT_OTHER): Payer: Medicare Other

## 2016-06-07 DIAGNOSIS — R079 Chest pain, unspecified: Secondary | ICD-10-CM

## 2016-06-07 DIAGNOSIS — R0789 Other chest pain: Secondary | ICD-10-CM | POA: Diagnosis not present

## 2016-06-07 LAB — NM MYOCAR MULTI W/SPECT W/WALL MOTION / EF
CHL CUP NUCLEAR SDS: 1
LVDIAVOL: 36 mL (ref 46–106)
LVSYSVOL: 7 mL
NUC STRESS TID: 1.06
Peak HR: 115 {beats}/min
Percent HR: 75 %
Rest HR: 79 {beats}/min
SRS: 1
SSS: 2

## 2016-06-07 MED ORDER — TECHNETIUM TC 99M TETROFOSMIN IV KIT
32.8000 | PACK | Freq: Once | INTRAVENOUS | Status: AC | PRN
  Administered 2016-06-07: 32.8 via INTRAVENOUS

## 2016-06-07 MED ORDER — REGADENOSON 0.4 MG/5ML IV SOLN
0.4000 mg | Freq: Once | INTRAVENOUS | Status: AC
  Administered 2016-06-07: 0.4 mg via INTRAVENOUS

## 2016-06-07 MED ORDER — TECHNETIUM TC 99M TETROFOSMIN IV KIT
14.0000 | PACK | Freq: Once | INTRAVENOUS | Status: AC | PRN
  Administered 2016-06-07: 14 via INTRAVENOUS

## 2016-06-07 NOTE — Care Management Obs Status (Signed)
[facility] NOTIFICATION   Patient Details  Name: Joann Buchanan MRN: [id number] Date of Birth: 15-Dec-1945   Medicare Observation Status Notification Given:  Yes    Katrina Stack, RN 06/07/2016, 4:01 PM

## 2016-06-07 NOTE — Discharge Summary (Signed)
Joann Buchanan, is a 70 y.o. female  DOB [birth year]  MRN [id number].  Admission date:  06/05/2016  Admitting Physician  Harvie Bridge, DO  Discharge Date:  06/07/2016   Primary MD  Loura Pardon, MD  Recommendations for primary care physician for things to follow:   Follow-up with primary doctor in 1 week   Admission Diagnosis  chest pain   Discharge Diagnosis  chest pain    Active Problems:   Chest pain, rule out acute myocardial infarction      Past Medical History:  Diagnosis Date  . Allergic rhinitis   . GERD (gastroesophageal reflux disease)   . Goiter    ?   Marland Kitchen HTN (hypertension)   . Hx of fibrocystic disease of breast   . Melanoma ([place])   . Migraine   . Numbness and tingling    bilateral hands  . Osteoarthritis of hand   . PONV (postoperative nausea and vomiting)     Past Surgical History:  Procedure Laterality Date  . ANTERIOR CERVICAL DECOMP/DISCECTOMY FUSION N/A 12/02/2015   Procedure: ANTERIOR CERVICAL DECOMPRESSION/DISCECTOMY FUSION CERVICAL FOUR-FIVE;  Surgeon: Eustace Moore, MD;  Location: [facility];  Service: Neurosurgery;  Laterality: N/A;  . APPENDECTOMY    . BREAST BIOPSY     normal  . CATARACT EXTRACTION Right Jan 2017  . DEXA  7/07   negative  . EMG     left upper arm- neg  . ESOPHAGOGASTRODUODENOSCOPY     normal-24 hour PH probe (-)  . EYE SURGERY  10/09   Left eye; for double vision  . PARTIAL HYSTERECTOMY     Ovaries intact  . TUBAL LIGATION         History of present illness and  Hospital Course:     Kindly see H&P for history of present illness and admission details, please review complete Labs, Consult reports and Test reports for all details in brief  HPI  from the history and physical done on the day of admission 70 year old female patient admitted for chest  pain. Patient has past medical history of bilateral hernia, GERD.   Hospital Course   #1 chest pain: Atypical, monitor on telemetry. Troponins negative for 3 times. Patient did have a Lexiscan stress test today morning, results showed that patient to EF is more than 55%, low risk scan without evidence of ischemia. Patient is stable to discharge home.  #2 GERD: Continue PPIs  #3 history of migraine headaches: Continue her medications 4.anxiety, a lot of stress at home: Patient is on Xanax: Continue that.   Discharge Condition: Stable   Follow UP      Discharge Instructions  and  Discharge Medications        Medication List    STOP taking these medications   dexamethasone 4 MG tablet Commonly known as:  DECADRON     TAKE these medications   acetaminophen 650 MG CR tablet Commonly known as:  TYLENOL Take 650 mg by mouth daily as needed for pain. Reported on 07/06/2015   ALIVE WOMENS 50+ PO Take 1 tablet by mouth daily.   ALPRAZolam 0.25 MG tablet Commonly known as:  XANAX Take 1 tablet (0.25 mg total) by mouth as needed. Prior to flights   [facility] HEADACHE 325-95-16 MG Tabs Generic drug:  Aspirin-Salicylamide-Caffeine Take 1 packet by mouth daily as needed (for headache).   gabapentin 300 MG capsule Commonly known as:  NEURONTIN Start 1 tablet at bedtime for 3 days, then increase  to 1 tablet twice daily.   GOODSENSE IBUPROFEN 200 MG tablet Generic drug:  ibuprofen 200 mg.   HYDROcodone-acetaminophen 5-325 MG tablet Commonly known as:  NORCO/VICODIN Take 1-2 tablets by mouth every 4 (four) hours as needed (mild pain).   lisinopril 2.5 MG tablet Commonly known as:  PRINIVIL,ZESTRIL Take 1 tablet (2.5 mg total) by mouth daily.   omeprazole 40 MG capsule Commonly known as:  PRILOSEC Take 40 mg by mouth daily.   SUMAtriptan 20 MG/ACT nasal spray Commonly known as:  IMITREX USE 1 SPRAY INTO THE NOSE EVERY 2 HOURS AS NEEDED FOR MIGRAINE. NO MORE THAN 2 DOSES  IN 24 HOURS   SYSTANE OP Apply 1 drop to eye daily as needed (for dry eyes).   vitamin C 500 MG tablet Commonly known as:  ASCORBIC ACID Take 500 mg by mouth daily.         Diet and Activity recommendation: See Discharge Instructions above   Consults obtained -none   Major procedures and Radiology Reports - PLEASE review detailed and final reports for all details, in brief -     Dg Chest 2 View  Result Date: 06/05/2016 CLINICAL DATA:  Shortness of breath and chest tightness EXAM: CHEST  2 VIEW COMPARISON:  Chest radiograph 11/23/2015 FINDINGS: Cardiomediastinal contours are normal. No pneumothorax or pleural effusion. No focal airspace consolidation or pulmonary edema. Cervical spine fusion hardware noted. IMPRESSION: No focal airspace disease. Electronically Signed   By: Ulyses Jarred M.D.   On: 06/05/2016 23:10   Nm Myocar Multi W/spect W/wall Motion / Ef  Result Date: 06/07/2016  There was no ST segment deviation noted during stress.  No T wave inversion was noted during stress.  The study is normal.  This is a low risk study.  The left ventricular ejection fraction is normal (55-65%).     Micro Results     No results found for this or any previous visit (from the past 240 hour(s)).     Today   Subjective:   Joann Buchanan today has no headache,no chest abdominal pain,no new weakness tingling or numbness, feels much better wants to go home today.  Objective:   Blood pressure 131/64, pulse 80, temperature 98.1 F (36.7 C), temperature source Oral, resp. rate 16, height 5' (1.524 m), weight 62.6 kg (138 lb), SpO2 93 %.   Intake/Output Summary (Last 24 hours) at 06/07/16 1423 Last data filed at 06/06/16 1945  Gross per 24 hour  Intake                0 ml  Output                0 ml  Net                0 ml    Exam Awake Alert, Oriented x 3, No new F.N deficits, Normal affect [place].AT,PERRAL Supple Neck,No JVD, No cervical lymphadenopathy appriciated.   Symmetrical Chest wall movement, Good air movement bilaterally, CTAB RRR,No Gallops,Rubs or new Murmurs, No Parasternal Heave +ve B.Sounds, Abd Soft, Non tender, No organomegaly appriciated, No rebound -guarding or rigidity. No Cyanosis, Clubbing or edema, No new Rash or bruise  Data Review   CBC w Diff: Lab Results  Component Value Date   WBC 6.9 06/06/2016   HGB 12.9 06/06/2016   HCT 37.0 06/06/2016   PLT 366 06/06/2016   LYMPHOPCT 47 11/23/2015   MONOPCT 11 11/23/2015   EOSPCT 2 11/23/2015   BASOPCT 1 11/23/2015  CMP: Lab Results  Component Value Date   NA 141 06/06/2016   K 3.7 06/06/2016   CL 111 06/06/2016   CO2 25 06/06/2016   BUN 17 06/06/2016   CREATININE 0.85 06/06/2016   PROT 6.9 09/28/2015   ALBUMIN 4.1 09/28/2015   BILITOT 0.5 09/28/2015   ALKPHOS 88 09/28/2015   AST 19 09/28/2015   ALT 21 09/28/2015  .   Total Time in preparing paper work, data evaluation and todays exam - 8 minutes  Joann Buchanan M.D on 06/07/2016 at 2:23 PM    Note: This dictation was prepared with Dragon dictation along with smaller phrase technology. Any transcriptional errors that result from this process are unintentional.

## 2016-06-07 NOTE — Progress Notes (Signed)
   Patient is for nuclear stress test this morning. Results to follow.

## 2016-06-11 ENCOUNTER — Telehealth: Payer: Self-pay

## 2016-06-11 NOTE — Telephone Encounter (Signed)
LM requesting patient to return call to complete TCM and schedule hosp f/u with PCP.

## 2016-06-14 NOTE — Telephone Encounter (Signed)
Transition Care Management Follow-up Telephone Call   Date discharged? 06/07/16   How have you been since you were released from the hospital? "still have tightness in my chest every now and then, when I talk too much I get short of breath"   Do you understand why you were in the hospital? yes, chest pain   Do you understand the discharge instructions? yes, "I don't know what the stress test showed"   Where were you discharged to? Home.    Items Reviewed:  Medications reviewed: no changes.  Allergies reviewed: yes  Dietary changes reviewed: no changes  Referrals reviewed: no referrals   Functional Questionnaire:   Activities of Daily Living (ADLs):   She states they are independent in the following: ambulation, bathing and hygiene, feeding, continence, grooming, toileting and dressing States they require assistance with the following: none   Any transportation issues/concerns?: no   Any patient concerns? no, "i would like the result of my stress test".    Confirmed importance and date/time of follow-up visits scheduled yes  Provider Appointment booked with PCP 06/19/16 at 11:15.   Confirmed with patient if condition begins to worsen call PCP or go to the ER.  Patient was given the office number and encouraged to call back with question or concerns.  : yes

## 2016-06-19 ENCOUNTER — Ambulatory Visit (INDEPENDENT_AMBULATORY_CARE_PROVIDER_SITE_OTHER): Payer: Medicare Other | Admitting: Family Medicine

## 2016-06-19 ENCOUNTER — Encounter: Payer: Self-pay | Admitting: Family Medicine

## 2016-06-19 VITALS — BP 128/82 | HR 87 | Temp 98.2°F | Ht 60.0 in | Wt 140.5 lb

## 2016-06-19 DIAGNOSIS — K219 Gastro-esophageal reflux disease without esophagitis: Secondary | ICD-10-CM

## 2016-06-19 DIAGNOSIS — I10 Essential (primary) hypertension: Secondary | ICD-10-CM

## 2016-06-19 DIAGNOSIS — R079 Chest pain, unspecified: Secondary | ICD-10-CM

## 2016-06-19 MED ORDER — OMEPRAZOLE 40 MG PO CPDR: 40.0000 mg | DELAYED_RELEASE_CAPSULE | Freq: Every day | ORAL | 11 refills | Status: DC | PRN

## 2016-06-19 NOTE — Progress Notes (Signed)
Pre visit review using our clinic review tool, if applicable. No additional management support is needed unless otherwise documented below in the visit note. 

## 2016-06-19 NOTE — Progress Notes (Signed)
Subjective:    Patient ID: Joann Buchanan, female    DOB: Jun 17, 1946, 70 y.o.   MRN: [id number]  HPI  Here for hosp from 11/28 to 06/07/16 for chest pain (chest tightness and sob)   Pt states after shopping and a movie came home and developed pain under her sternum with some sob -they called ambulance   She was admitted for cp  troponins were neg times 3 lexiscan stress test showed EF over 55% and low risk w/o evidence of ischemia   rec continue ppi for gerd   Disc migraine and anxiety with stressors at home - ? Contrib to symptoms  Pt states the day it happened was not a bad day  She does get some heartburn now and then but this felt really different    Wt Readings from Last 3 Encounters:  06/19/16 140 lb 8 oz (63.7 kg)  06/05/16 138 lb (62.6 kg)  12/28/15 135 lb 7 oz (61.4 kg)   bmi is 27.4  Dg Chest 2 View  Result Date: 06/05/2016 CLINICAL DATA:  Shortness of breath and chest tightness EXAM: CHEST  2 VIEW COMPARISON:  Chest radiograph 11/23/2015 FINDINGS: Cardiomediastinal contours are normal. No pneumothorax or pleural effusion. No focal airspace consolidation or pulmonary edema. Cervical spine fusion hardware noted. IMPRESSION: No focal airspace disease. Electronically Signed   By: Ulyses Jarred M.D.   On: 06/05/2016 23:10   Nm Myocar Multi W/spect W/wall Motion / Ef  Result Date: 06/07/2016  There was no ST segment deviation noted during stress.  No T wave inversion was noted during stress.  The study is normal.  This is a low risk study.  The left ventricular ejection fraction is normal (55-65%).      Re assuring labs and vitals  Results for orders placed or performed during the hospital encounter of 06/05/16  NM Myocar Multi W/Spect W/Wall Motion / EF  Result Value Ref Range   Rest HR 79 bpm   Rest BP 137/82 mmHg   Percent HR 75 %   Peak HR 115 bpm   Peak BP 151/71 mmHg   SSS 2    SRS 1    SDS 1    TID 1.06    LV sys vol 7 mL   LV dias vol 36 46 - 106  mL  CBC  Result Value Ref Range   WBC 7.9 3.6 - 11.0 K/uL   RBC 4.45 3.80 - 5.20 MIL/uL   Hemoglobin 13.3 12.0 - 16.0 g/dL   HCT 38.8 35.0 - 47.0 %   MCV 87.2 80.0 - 100.0 fL   MCH 29.8 26.0 - 34.0 pg   MCHC 34.1 32.0 - 36.0 g/dL   RDW 13.8 11.5 - 14.5 %   Platelets 374 150 - 440 K/uL  Basic metabolic panel  Result Value Ref Range   Sodium 141 135 - 145 mmol/L   Potassium 3.8 3.5 - 5.1 mmol/L   Chloride 109 101 - 111 mmol/L   CO2 23 22 - 32 mmol/L   Glucose, Bld 126 (H) 65 - 99 mg/dL   BUN 18 6 - 20 mg/dL   Creatinine, Ser 0.97 0.44 - 1.00 mg/dL   Calcium 10.0 8.9 - 10.3 mg/dL   GFR calc non Af Amer 59 (L) >60 mL/min   GFR calc Af Amer >60 >60 mL/min   Anion gap 9 5 - 15  Troponin I  Result Value Ref Range   Troponin I <0.03 <0.03 ng/mL  Basic metabolic panel  Result Value Ref Range   Sodium 141 135 - 145 mmol/L   Potassium 3.7 3.5 - 5.1 mmol/L   Chloride 111 101 - 111 mmol/L   CO2 25 22 - 32 mmol/L   Glucose, Bld 94 65 - 99 mg/dL   BUN 17 6 - 20 mg/dL   Creatinine, Ser 0.85 0.44 - 1.00 mg/dL   Calcium 9.5 8.9 - 10.3 mg/dL   GFR calc non Af Amer >60 >60 mL/min   GFR calc Af Amer >60 >60 mL/min   Anion gap 5 5 - 15  CBC  Result Value Ref Range   WBC 6.9 3.6 - 11.0 K/uL   RBC 4.14 3.80 - 5.20 MIL/uL   Hemoglobin 12.9 12.0 - 16.0 g/dL   HCT 37.0 35.0 - 47.0 %   MCV 89.4 80.0 - 100.0 fL   MCH 31.2 26.0 - 34.0 pg   MCHC 34.9 32.0 - 36.0 g/dL   RDW 13.7 11.5 - 14.5 %   Platelets 366 150 - 440 K/uL  Lipid panel  Result Value Ref Range   Cholesterol 175 0 - 200 mg/dL   Triglycerides 234 (H) <150 mg/dL   HDL 52 >40 mg/dL   Total CHOL/HDL Ratio 3.4 RATIO   VLDL 47 (H) 0 - 40 mg/dL   LDL Cholesterol 76 0 - 99 mg/dL  TSH  Result Value Ref Range   TSH 3.681 0.350 - 4.500 uIU/mL  Troponin I  Result Value Ref Range   Troponin I <0.03 <0.03 ng/mL  Troponin I  Result Value Ref Range   Troponin I <0.03 <0.03 ng/mL  Troponin I  Result Value Ref Range   Troponin I  <0.03 <0.03 ng/mL    Triglycerides were high but not fasting   Pt declines flu shots   bp is stable today  No cp or palpitations or headaches or edema  No side effects to medicines  BP Readings from Last 3 Encounters:  06/19/16 128/82  06/07/16 131/64  12/28/15 (!) 148/80      Overall much improved  One day had bilat rib pain at night- better after she put her legs up and went to sleep  occ gets a pins and needles feeling in shoulders   Stressors- husband is getting worse (this is hard)  Had gone for 2nd opinion the day of her pain - and with a new recent med is improved   Dr Rockey Situ has a f/u with her in January (he also cares for her husband)   GERD is worse -heartburn almost every night  Throat clearing  Takes omeprazole only when really bad -not often  EGD 2014 with esoph stricture and gerd   Patient Active Problem List   Diagnosis Date Noted  . Chest pain, rule out acute myocardial infarction 06/05/2016  . S/P cervical spinal fusion 12/02/2015  . Osteopenia 10/25/2015  . Estrogen deficiency 10/05/2015  . Routine general medical examination at a health care facility 09/25/2015  . Caregiver stress 04/06/2015  . Hand tingling 04/05/2015  . Encounter for Medicare annual wellness exam 03/03/2013  . FOLLICULITIS 0000000  . OSTEOARTHRITIS, HANDS, BILATERAL 11/01/2009  . Menopausal symptoms 03/23/2009  . MELANOMA, MALIGNANT, TRUNK 02/04/2007  . THYROMEGALY 02/04/2007  . Migraine 02/04/2007  . Essential hypertension 02/04/2007  . ALLERGIC RHINITIS 02/04/2007  . GERD 02/04/2007   Past Medical History:  Diagnosis Date  . Allergic rhinitis   . GERD (gastroesophageal reflux disease)   . Goiter    ?   Marland Kitchen  HTN (hypertension)   . Hx of fibrocystic disease of breast   . Melanoma ([place])   . Migraine   . Numbness and tingling    bilateral hands  . Osteoarthritis of hand   . PONV (postoperative nausea and vomiting)    Past Surgical History:  Procedure Laterality Date    . ANTERIOR CERVICAL DECOMP/DISCECTOMY FUSION N/A 12/02/2015   Procedure: ANTERIOR CERVICAL DECOMPRESSION/DISCECTOMY FUSION CERVICAL FOUR-FIVE;  Surgeon: Eustace Moore, MD;  Location: [facility];  Service: Neurosurgery;  Laterality: N/A;  . APPENDECTOMY    . BREAST BIOPSY     normal  . CATARACT EXTRACTION Right Jan 2017  . DEXA  7/07   negative  . EMG     left upper arm- neg  . ESOPHAGOGASTRODUODENOSCOPY     normal-24 hour PH probe (-)  . EYE SURGERY  10/09   Left eye; for double vision  . PARTIAL HYSTERECTOMY     Ovaries intact  . TUBAL LIGATION     Social History  Substance Use Topics  . Smoking status: Never Smoker  . Smokeless tobacco: Never Used  . Alcohol use No   Family History  Problem Relation Age of Onset  . Breast cancer Mother     Died, 48  . Other Father     Died, 34 - tree fell on him  . Breast cancer Sister     Died, 38  . Heart disease Sister     Died, 81  . Healthy Son   . Healthy Daughter    Allergies  Allergen Reactions  . Sulfonamide Derivatives Anaphylaxis and Other (See Comments)    REACTION: unspecified Hives and swelling   Current Outpatient Prescriptions on File Prior to Visit  Medication Sig Dispense Refill  . ALPRAZolam (XANAX) 0.25 MG tablet Take 1 tablet (0.25 mg total) by mouth as needed. Prior to flights 30 tablet 0  . Aspirin-Salicylamide-Caffeine (BC HEADACHE) 325-95-16 MG TABS Take 1 packet by mouth daily as needed (for headache).    Vladimir Faster Glycol-Propyl Glycol (SYSTANE OP) Apply 1 drop to eye daily as needed (for dry eyes).     . SUMAtriptan (IMITREX) 20 MG/ACT nasal spray USE 1 SPRAY INTO THE NOSE EVERY 2 HOURS AS NEEDED FOR MIGRAINE. NO MORE THAN 2 DOSES IN 24 HOURS 1 Inhaler 3   No current facility-administered medications on file prior to visit.     Review of Systems    Review of Systems  Constitutional: Negative for fever, appetite change, fatigue and unexpected weight change.  Eyes: Negative for pain and visual  disturbance.  Respiratory: Negative for cough and shortness of breath.   Cardiovascular: Negative for pnd/orthopnea or palpitations   Neg for sob on exertion or pedal edema  Gastrointestinal: Negative for nausea, diarrhea and constipation. pos for heartburn and reflux of acid frequently with some throat clearing  Genitourinary: Negative for urgency and frequency.  Skin: Negative for pallor or rash   Neurological: Negative for weakness, light-headedness, numbness and headaches. pos for occ tingling in shoulders  Hematological: Negative for adenopathy. Does not bruise/bleed easily.  Psychiatric/Behavioral: Negative for dysphoric mood. The patient is not nervous/anxious.  pos for stressors     Objective:   Physical Exam  Constitutional: She appears well-developed and well-nourished. No distress.  Well appearing   HENT:  Head: Normocephalic and atraumatic.  Mouth/Throat: Oropharynx is clear and moist.  Throat is clear   Eyes: Conjunctivae and EOM are normal. Pupils are equal, round, and reactive to light.  Neck: Normal range of motion. Neck supple. No JVD present. Carotid bruit is not present. No thyromegaly present.  Cardiovascular: Normal rate, regular rhythm, normal heart sounds and intact distal pulses.  Exam reveals no gallop.   Pulmonary/Chest: Effort normal and breath sounds normal. No respiratory distress. She has no wheezes. She has no rales.  No crackles  Abdominal: Soft. Bowel sounds are normal. She exhibits no distension, no abdominal bruit, no pulsatile midline mass and no mass. There is no hepatosplenomegaly. There is tenderness in the epigastric area. There is no rebound, no CVA tenderness and negative Murphy's sign.  Musculoskeletal: She exhibits no edema.  Lymphadenopathy:    She has no cervical adenopathy.  Neurological: She is alert. She has normal reflexes.  Skin: Skin is warm and dry. No rash noted. No pallor.  Psychiatric: She has a normal mood and affect.           Assessment & Plan:   Problem List Items Addressed This Visit      Cardiovascular and Mediastinum   Essential hypertension - Primary    bp in fair control at this time  BP Readings from Last 1 Encounters:  06/19/16 128/82   No changes needed Disc lifstyle change with low sodium diet and exercise  Labs reviewed and hosp records         Digestive   GERD    This is much worse and may have added to her CP when admitted Having reflux every night  Enc to take omeprazole daily (not just occas)  Also rev diet and handout given for foods and gerd  rec not eating late  Elevate head of bed if needed  Update if not sig improved in 1-2 weeks Rev last EGD No dysphagia       Relevant Medications   omeprazole (PRILOSEC) 40 MG capsule     Other   Chest pain, rule out acute myocardial infarction    Re assuring hospital w/u -rev notes/studies/imaging and lab in detail  Feels much better  ? If stress added to it  ? If gerd added to it (not controlled)  She will f/u with cardiology/ Dr Rockey Situ as planned   Did offer ref to counseling at any time Is worried about her husband and family

## 2016-06-19 NOTE — Assessment & Plan Note (Signed)
This is much worse and may have added to her CP when admitted Having reflux every night  Enc to take omeprazole daily (not just occas)  Also rev diet and handout given for foods and gerd  rec not eating late  Elevate head of bed if needed  Update if not sig improved in 1-2 weeks Rev last EGD No dysphagia

## 2016-06-19 NOTE — Patient Instructions (Addendum)
Get back on omeprazole every day in the am  Avoid foods that bother you  Don't eat late Update me if symptoms do not improve Cardiac work up is re assuring -follow up with Dr Rockey Situ   If you want to see a counselor for stress for any time - call and let us know

## 2016-06-19 NOTE — Assessment & Plan Note (Signed)
Re assuring hospital w/u -rev notes/studies/imaging and lab in detail  Feels much better  ? If stress added to it  ? If gerd added to it (not controlled)  She will f/u with cardiology/ Dr Rockey Situ as planned   Did offer ref to counseling at any time Is worried about her husband and family

## 2016-06-19 NOTE — Assessment & Plan Note (Signed)
bp in fair control at this time  BP Readings from Last 1 Encounters:  06/19/16 128/82   No changes needed Disc lifstyle change with low sodium diet and exercise  Labs reviewed and hosp records

## 2016-06-28 ENCOUNTER — Encounter: Payer: Self-pay | Admitting: Neurology

## 2016-06-28 ENCOUNTER — Ambulatory Visit (INDEPENDENT_AMBULATORY_CARE_PROVIDER_SITE_OTHER): Payer: Medicare Other | Admitting: Neurology

## 2016-06-28 VITALS — BP 100/64 | HR 84 | Ht 60.0 in | Wt 141.6 lb

## 2016-06-28 DIAGNOSIS — R292 Abnormal reflex: Secondary | ICD-10-CM

## 2016-06-28 DIAGNOSIS — R202 Paresthesia of skin: Secondary | ICD-10-CM

## 2016-06-28 DIAGNOSIS — Z981 Arthrodesis status: Secondary | ICD-10-CM | POA: Diagnosis not present

## 2016-06-28 NOTE — Patient Instructions (Signed)
1.  You can try to use a wrist splint to see if this helps with your right hand tingling 2.  Return to clinic in 6 months

## 2016-06-28 NOTE — Progress Notes (Signed)
Follow-up Visit   Date: 06/28/16   Joann Buchanan MRN: [id number] DOB: Jun 29, 1946   Interim History: Joann Buchanan is a 70 y.o. right-handed Caucasian female with GERD, HTN, and migraines returning to the clinic for new complaints of bilateral hand paresthesias.  The patient was accompanied to the clinic by self.  History of present illness Initial visit 04/10/2013 for headaches:   Migraines started in her late 13s and stopped in her 38s. She was previously on propranolol and antidepressant, but she did not tolerate it. They started again intermittently over the past 10-15 years and have become worse over the past two months. Headaches are located over center of the forehead, described pounding, and radiates towards the neck. It usually starts in the afternoon and if she is able to sleep it off, will be better by the morning. If she unable to sleep, then she is unable to focus and feels like a "hangover". Frequency is twice per month. She has noticed that onions trigger her symptoms. There is associated photophobia, phonophobia, nausea, and vomiting. She has visual auras after the headache, described wavy lines and blurry vision and there is occasional tingling over the left side of her face. She takes imitrex nasal spray and takes it twice per month which works. She takes motrin or Aleve 3-4 times per week and used flexiril for the past two nights which has helped.   Around the end of July 2014, she developed a new headache at the base of her headache, characterized as pressure-like stinging sensation, as if her hair is being pulled. This headache occurs in the morning and has been constant. She was administered greater occipital nerve block which alleviated symptoms.   UPDATE 10/10/2015: Starting around early March, she began noticing tingling and numbness over the tips of her fingers which gradually progressed to involve her palm and dorsum of the hand within a week.  Currently, she has  constant paresthesias involving both hands to the level of the wrists. She is more careful when lifting objects and fine motor tasks such as putting on make up or jewelry. There are no identifiable factors the improve to exacerbate symptoms.  She does not have any neck pain or similar paresthesias of the feet.  She is not diabetic, alcoholic, or thyroid disease.      Headaches are much improved, she only takes imitrex twice per month.   UPDATE 12/28/2015:  She underwent cervical fusion for cervical myelopathy due to disc protrusion at C4-5 in May and has noticed that hand paresthesias are slightly better.  This morning, she noticed that her right hand was not tingling, which was an improvement.   Her gait has improved.  She complains of dysphagia and choking spells since the surgery. She feels pressure against her esophagus when swallowing.     UPDATE 06/28/2016:  She is here for 6 month appointment.  She continues to have tingling of the right hand and very minimal paresthesias on the left. Her symptoms are slowly improving.  Hand tingling is worse at night and often wakes her at night.  She does not have any weakness of the hands. She has noticed voice change since surgery and now no longer sings soprano.   Medications:  Current Outpatient Prescriptions on File Prior to Visit  Medication Sig Dispense Refill  . ALPRAZolam (XANAX) 0.25 MG tablet Take 1 tablet (0.25 mg total) by mouth as needed. Prior to flights 30 tablet 0  . Aspirin-Salicylamide-Caffeine (BC HEADACHE) [phone]  MG TABS Take 1 packet by mouth daily as needed (for headache).    Marland Kitchen omeprazole (PRILOSEC) 40 MG capsule Take 1 capsule (40 mg total) by mouth daily as needed. 30 capsule 11  . Polyethyl Glycol-Propyl Glycol (SYSTANE OP) Apply 1 drop to eye daily as needed (for dry eyes).     . SUMAtriptan (IMITREX) 20 MG/ACT nasal spray USE 1 SPRAY INTO THE NOSE EVERY 2 HOURS AS NEEDED FOR MIGRAINE. NO MORE THAN 2 DOSES IN 24 HOURS 1 Inhaler 3     No current facility-administered medications on file prior to visit.     Allergies:  Allergies  Allergen Reactions  . Sulfonamide Derivatives Anaphylaxis and Other (See Comments)    REACTION: unspecified Hives and swelling    Review of Systems:  CONSTITUTIONAL: No fevers, chills, night sweats, or weight loss.  EYES: No visual changes or eye pain ENT: No hearing changes.  No history of nose bleeds.   RESPIRATORY: No cough, wheezing and shortness of breath.   CARDIOVASCULAR: Negative for chest pain, and palpitations.   GI: Negative for abdominal discomfort, blood in stools or black stools.  No recent change in bowel habits.   GU:  No history of incontinence.   MUSCLOSKELETAL: No history of joint pain or swelling.  No myalgias.   SKIN: Negative for lesions, rash, and itching.   ENDOCRINE: Negative for cold or heat intolerance, polydipsia or goiter.   PSYCH:  No depression or anxiety symptoms.   NEURO: As Above.   Vital Signs:  BP 100/64   Pulse 84   Ht 5' (1.524 m)   Wt 141 lb 9 oz (64.2 kg)   SpO2 96%   BMI 27.65 kg/m   Neurological Exam: MENTAL STATUS including orientation to time, place, person, recent and remote memory, attention span and concentration, language, and fund of knowledge is normal.  Speech is not dysarthric.  CRANIAL NERVES:  Normal fundoscopic exam.  No visual field defects. Pupils equal round and reactive to light.  Normal conjugate, extra-ocular eye movements in all directions of gaze.  No ptosis. Normal facial sensation.  Face is symmetric. Palate elevates symmetrically.  Tongue is midline.  MOTOR:  Motor strength is 5/5 in all extremities, except mild intrinsic hand weakness 5-/5 (L >R).  No atrophy, fasciculations or abnormal movements.  Tone is normal.    MSRs:  Right                                                                 Left brachioradialis 2+  brachioradialis 2+  biceps 2+  biceps 3+  triceps 2+  triceps 2+  patellar 3+  patellar 3+   ankle jerk 2+  ankle jerk 2+  Hoffman no  Hoffman yes   SENSORY:  Temperature is reduced over the palmer surfaces of the R hand.  Negative Tinel's sign bilaterally at the wrist.  COORDINATION/GAIT:    Gait narrow based and stable.   Data: Lab Results  Component Value Date   TSH 3.681 06/06/2016   MRI cervical spine wwo contrast 10/25/2015:   1. Moderate-sized right paracentral disc protrusion at C4-5 likely accounting for the patient's right radicular symptoms. 2. 6 mm intraspinal lesion just below the C2-3 disc space level most consistent with a small meningioma.  No mass effect at this time. Routine surveillance is suggested in 1 year.   IMPRESSION/PLAN: 1.  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 Ronnald Ramp in May 2017  - Clinically with slowly improving bilateral hand paresthesias, no weakness or worsening symptoms  2.  Right hand paresthesias are most likely residual deficits from her stenosis, however, she may also have mild CTS.    - If she is not improved by her next visit, recommend NCS/EMG of the right hand to evaluate.   - In the meantime, she is welcome to start using a wrist splint and see if this helps  3.  Episodic migraines, well controlled and occuring only 1-2 times per month.  Continue imitrex as needed, but limit to twice per week.  Return to clinic in 6 months   The duration of this appointment visit was 20 minutes of face-to-face time with the patient.  Greater than 50% of this time was spent in counseling, explanation of diagnosis, planning of further management, and coordination of care.   Thank you for allowing me to participate in patient's care.  If I can answer any additional questions, I would be pleased to do so.    Sincerely,    Shayona Hibbitts K. Posey Pronto, DO

## 2016-07-09 HISTORY — PX: CATARACT EXTRACTION W/ INTRAOCULAR LENS IMPLANT: SHX1309

## 2016-07-13 ENCOUNTER — Encounter: Payer: Self-pay | Admitting: Cardiovascular Disease

## 2016-07-13 ENCOUNTER — Ambulatory Visit (INDEPENDENT_AMBULATORY_CARE_PROVIDER_SITE_OTHER): Payer: Medicare Other | Admitting: Cardiovascular Disease

## 2016-07-13 VITALS — BP 122/82 | HR 95 | Ht 60.0 in | Wt 139.5 lb

## 2016-07-13 DIAGNOSIS — I10 Essential (primary) hypertension: Secondary | ICD-10-CM

## 2016-07-13 DIAGNOSIS — R079 Chest pain, unspecified: Secondary | ICD-10-CM | POA: Diagnosis not present

## 2016-07-13 DIAGNOSIS — R0602 Shortness of breath: Secondary | ICD-10-CM

## 2016-07-13 DIAGNOSIS — G43709 Chronic migraine without aura, not intractable, without status migrainosus: Secondary | ICD-10-CM

## 2016-07-13 NOTE — Patient Instructions (Signed)
Medication Instructions:   No medication changes made  Labwork:  No new labs needed  Testing/Procedures:  No further testing at this time   I recommend watching educational videos on topics of interest to you at:       [email or web address]  Enter code: [email or web address]    Follow-Up: It was a pleasure seeing you in the office today. Please call us if you have new issues that need to be addressed before your next appt.  [phone]  Your physician wants you to follow-up in:  As needed  If you need a refill on your cardiac medications before your next appointment, please call your pharmacy.     

## 2016-07-13 NOTE — Progress Notes (Signed)
Cardiology Office Note  Date:  07/13/2016   ID:  Joann Buchanan, DOB 1946/04/14, MRN [id number]  PCP:  Joann Pardon, MD   Chief Complaint  Patient presents with  . other    Self ref for follow up on Myoview that was done at [facility]; Nov. 28, 2017. Meds reviewed by the pt. verbally. Pt. c/o shortness of breath with over exertion.     HPI:  Joann Buchanan is a 71 y.o. female with a known history of GERD, hypertension,, diet controlled, migraines Recently admitted to the hospital for chest pain  after eating a hamburger who presents by self-referral to the cardiology clinic for further evaluation  Patient was in a usual state of health until  this evening  of 11/28/2017when she experienced a sudden onset of substernal, upper epigastric pressure associated with shortness of breath. She reports that she ate a hamburger prior to the onset of symptoms . She did report some burping   history of indigestion and gastroesophageal reflux At the time she did not feel this was gastrointestinal  No nausea, vomiting, diaphoresis, palpitations, radiation of her pain.  Pain resolved with nitroglycerin  She did develop shortness of breath when moving to take her chest x-ray.  In the hospital she had Normal stress test 06/07/2016  Since that time she has felt well, active with no complaints Denies any significant shortness of breath on exertion No chest pain at rest or with exertion Occasionally when she pushes herself very Buchanan she gets tingling on her back Tingling resolves with deep breaths and rest  Long discussion concerning her family history Mom passed from cancer 5 Dad with accident 68 yo, siblings ok, sister with cancer  She is not a smoker, no significant alcohol history, no diabetes Cholesterol reviewed with her ranging from 170 up to 200  EKG on today's visit shows normal sinus rhythm with rate 96 beats, no significant ST or T-wave changes   PMH:   has a past medical history of Allergic  rhinitis; GERD (gastroesophageal reflux disease); Goiter; HTN (hypertension); fibrocystic disease of breast; Melanoma ([place]); Migraine; Numbness and tingling; Osteoarthritis of hand; and PONV (postoperative nausea and vomiting).  PSH:    Past Surgical History:  Procedure Laterality Date  . ANTERIOR CERVICAL DECOMP/DISCECTOMY FUSION N/A 12/02/2015   Procedure: ANTERIOR CERVICAL DECOMPRESSION/DISCECTOMY FUSION CERVICAL FOUR-FIVE;  Surgeon: Joann Moore, MD;  Location: [place] [facility];  Service: Neurosurgery;  Laterality: N/A;  . APPENDECTOMY    . BREAST BIOPSY     normal  . CATARACT EXTRACTION Right Jan 2017  . DEXA  7/07   negative  . EMG     left upper arm- neg  . ESOPHAGOGASTRODUODENOSCOPY     normal-24 hour PH probe (-)  . EYE SURGERY  10/09   Left eye; for double vision  . PARTIAL HYSTERECTOMY     Ovaries intact  . TUBAL LIGATION      Current Outpatient Prescriptions  Medication Sig Dispense Refill  . ALPRAZolam (XANAX) 0.25 MG tablet Take 1 tablet (0.25 mg total) by mouth as needed. Prior to flights 30 tablet 0  . Aspirin-Salicylamide-Caffeine (BC HEADACHE) 325-95-16 MG TABS Take 1 packet by mouth daily as needed (for headache).    Marland Kitchen ibuprofen (ADVIL,MOTRIN) 200 MG tablet 200 mg.    . Ibuprofen-Diphenhydramine HCl 200-25 MG CAPS Take by mouth.    Marland Kitchen omeprazole (PRILOSEC) 40 MG capsule Take 1 capsule (40 mg total) by mouth daily as needed. 30 capsule 11  . Polyethyl  Glycol-Propyl Glycol 0.4-0.3 % SOLN Place 1 drop into both eyes as needed.    . SUMAtriptan (IMITREX) 20 MG/ACT nasal spray USE 1 SPRAY INTO THE NOSE EVERY 2 HOURS AS NEEDED FOR MIGRAINE. NO MORE THAN 2 DOSES IN 24 HOURS 1 Inhaler 3   No current facility-administered medications for this visit.      Allergies:   Sulfonamide derivatives   Social History:  The patient  reports that she has never smoked. She has never used smokeless tobacco. She reports that she does not drink alcohol or use drugs.   Family History:    family history includes Breast cancer in her mother and sister; Healthy in her daughter and son; Heart disease in her sister; Other in her father.    Review of Systems: Review of Systems  Constitutional: Negative.   Respiratory: Negative.   Cardiovascular: Positive for chest pain.  Gastrointestinal: Negative.   Musculoskeletal: Negative.   Neurological: Negative.   Psychiatric/Behavioral: Negative.   All other systems reviewed and are negative.    PHYSICAL EXAM: VS:  BP 122/82 (BP Location: Right Arm, Patient Position: Sitting, Cuff Size: Normal)   Pulse 95   Ht 5' (1.524 m)   Wt 139 lb 8 oz (63.3 kg)   BMI 27.24 kg/m  , BMI Body mass index is 27.24 kg/m. GEN: Well nourished, well developed, in no acute distress  HEENT: normal  Neck: no JVD, carotid bruits, or masses Cardiac: RRR; no murmurs, rubs, or gallops,no edema  Respiratory:  clear to auscultation bilaterally, normal work of breathing GI: soft, nontender, nondistended, + BS MS: no deformity or atrophy  Skin: warm and dry, no rash Neuro:  Strength and sensation are intact Psych: euthymic mood, full affect    Recent Labs: 09/28/2015: ALT 21 06/06/2016: BUN 17; Creatinine, Ser 0.85; Hemoglobin 12.9; Platelets 366; Potassium 3.7; Sodium 141; TSH 3.681    Lipid Panel Lab Results  Component Value Date   CHOL 175 06/06/2016   HDL 52 06/06/2016   LDLCALC 76 06/06/2016   TRIG 234 (H) 06/06/2016      Wt Readings from Last 3 Encounters:  07/13/16 139 lb 8 oz (63.3 kg)  06/28/16 141 lb 9 oz (64.2 kg)  06/19/16 140 lb 8 oz (63.7 kg)       ASSESSMENT AND PLAN:  Essential hypertension - Plan: EKG 12-Lead Blood pressure is well controlled on today's visit. No changes made to the medications.  SOB (shortness of breath) on exertion - Plan: EKG 12-Lead Episode of shortness of breath end of November 2017, no further episodes since that time Etiology unclear, no signs of heart failure, normal stress  test Recommended she call us if she has recurrent symptoms  Chronic migraine without aura without status migrainosus, not intractable She takes BC headache powder. Suggested she take this sparingly given risk of uncoated aspirin  Chest pain, rule out acute myocardial infarction Recent hospital records reviewed, details discussed with her. Reviewed stress test results with her. Recommended she has any recurrent chest pain or shortness of breath symptoms that she call our office. Further testing could be done such as echocardiogram, CT coronary calcium scoring    Total encounter time more than 45 minutes  Greater than 50% was spent in counseling and coordination of care with the patient   Disposition:   F/Uas needed    Orders Placed This Encounter  Procedures  . EKG 12-Lead     Signed, Esmond Plants, M.D., Ph.D. 07/13/2016  [place]  [place], [place]

## 2016-09-24 ENCOUNTER — Telehealth: Payer: Self-pay | Admitting: Family Medicine

## 2016-09-24 MED ORDER — ALPRAZOLAM 0.25 MG PO TABS: 0.2500 mg | ORAL_TABLET | ORAL | 0 refills | Status: DC | PRN

## 2016-09-24 MED ORDER — SCOPOLAMINE 1 MG/3DAYS TD PT72
1.0000 | MEDICATED_PATCH | TRANSDERMAL | 0 refills | Status: DC
Start: 2016-09-24 — End: 2017-08-14

## 2016-09-24 NOTE — Telephone Encounter (Signed)
Pt is going on cruise in a few weeks. Pt is asking for xanax refill to help calm her nerves. She is also asking for something to help with nausea. Please advise  [facility]

## 2016-09-24 NOTE — Telephone Encounter (Signed)
Rxs called in as prescribed and pt advise and advise of the sedation caution

## 2016-09-24 NOTE — Telephone Encounter (Signed)
Px written for call in  - motion sickness patch and also xanax  Caution of sedation from both meds

## 2016-09-27 ENCOUNTER — Telehealth: Payer: Self-pay

## 2016-09-27 NOTE — Telephone Encounter (Signed)
Form completed on CoverMyMeds. Response within the next 72 business hours

## 2016-09-28 NOTE — Telephone Encounter (Signed)
Received fax requesting additional info, form in Dr. Marliss Coots inbox

## 2016-09-28 NOTE — Telephone Encounter (Signed)
Done in IN box 

## 2016-09-30 ENCOUNTER — Telehealth: Payer: Self-pay | Admitting: Family Medicine

## 2016-09-30 DIAGNOSIS — Z0000 Encounter for general adult medical examination without abnormal findings: Secondary | ICD-10-CM

## 2016-09-30 NOTE — Telephone Encounter (Signed)
-----   Message from Eustace Pen, LPN sent at 7/61/8485 12:31 AM EDT ----- Regarding: Labs 4/3 Please place lab orders. Thank you.

## 2016-10-01 NOTE — Telephone Encounter (Signed)
If they won't cover it she will just have to go with otc dramamine as needed/ as directed  Watch out for sedation with this (same as the patch)

## 2016-10-01 NOTE — Telephone Encounter (Signed)
Response received indicating a denial of pts PA for scopolamine. Pt will need new Rx and may have to contact her insurance company to verify which meds are on the formulary list, as there were no suggestions

## 2016-10-01 NOTE — Telephone Encounter (Signed)
PA form faxed.  

## 2016-10-01 NOTE — Telephone Encounter (Signed)
Pt notified of PA denial and advise she can either pay out of pocket for patch or get OTC dramamine and take as directed pt verbalized understanding, she will check with the pharmacy to see how much the patches are if to expensive she will get the OTC med

## 2016-10-09 ENCOUNTER — Ambulatory Visit (INDEPENDENT_AMBULATORY_CARE_PROVIDER_SITE_OTHER): Payer: Medicare Other

## 2016-10-09 VITALS — BP 118/70 | HR 72 | Temp 97.8°F | Ht 59.75 in | Wt 140.5 lb

## 2016-10-09 DIAGNOSIS — Z0000 Encounter for general adult medical examination without abnormal findings: Secondary | ICD-10-CM

## 2016-10-09 DIAGNOSIS — Z1159 Encounter for screening for other viral diseases: Secondary | ICD-10-CM

## 2016-10-09 LAB — CBC WITH DIFFERENTIAL/PLATELET
BASOS ABS: 0.1 10*3/uL (ref 0.0–0.1)
Basophils Relative: 0.9 % (ref 0.0–3.0)
Eosinophils Absolute: 0.1 10*3/uL (ref 0.0–0.7)
Eosinophils Relative: 1.8 % (ref 0.0–5.0)
HCT: 39.4 % (ref 36.0–46.0)
Hemoglobin: 13.3 g/dL (ref 12.0–15.0)
LYMPHS ABS: 2.8 10*3/uL (ref 0.7–4.0)
Lymphocytes Relative: 42.9 % (ref 12.0–46.0)
MCHC: 33.7 g/dL (ref 30.0–36.0)
MCV: 88.7 fl (ref 78.0–100.0)
MONO ABS: 0.7 10*3/uL (ref 0.1–1.0)
Monocytes Relative: 11.2 % (ref 3.0–12.0)
NEUTROS ABS: 2.8 10*3/uL (ref 1.4–7.7)
NEUTROS PCT: 43.2 % (ref 43.0–77.0)
PLATELETS: 421 10*3/uL — AB (ref 150.0–400.0)
RBC: 4.45 Mil/uL (ref 3.87–5.11)
RDW: 13.7 % (ref 11.5–15.5)
WBC: 6.6 10*3/uL (ref 4.0–10.5)

## 2016-10-09 LAB — LIPID PANEL
CHOLESTEROL: 191 mg/dL (ref 0–200)
HDL: 55.5 mg/dL (ref >39.00)
LDL CALC: 105 mg/dL — AB (ref 0–99)
NonHDL: 135.55
TRIGLYCERIDES: 152 mg/dL — AB (ref 0.0–149.0)
Total CHOL/HDL Ratio: 3
VLDL: 30.4 mg/dL (ref 0.0–40.0)

## 2016-10-09 LAB — COMPREHENSIVE METABOLIC PANEL
ALT: 21 U/L (ref 0–35)
AST: 20 U/L (ref 0–37)
Albumin: 4.1 g/dL (ref 3.5–5.2)
Alkaline Phosphatase: 78 U/L (ref 39–117)
BILIRUBIN TOTAL: 0.4 mg/dL (ref 0.2–1.2)
BUN: 16 mg/dL (ref 6–23)
CHLORIDE: 107 meq/L (ref 96–112)
CO2: 28 mEq/L (ref 19–32)
CREATININE: 0.87 mg/dL (ref 0.40–1.20)
Calcium: 10.1 mg/dL (ref 8.4–10.5)
GFR: 68.58 mL/min (ref >60.00)
Glucose, Bld: 93 mg/dL (ref 70–99)
Potassium: 4.2 mEq/L (ref 3.5–5.1)
SODIUM: 139 meq/L (ref 135–145)
TOTAL PROTEIN: 6.7 g/dL (ref 6.0–8.3)

## 2016-10-09 LAB — TSH: TSH: 0.84 u[IU]/mL (ref 0.35–4.50)

## 2016-10-09 NOTE — Progress Notes (Signed)
Pre visit review using our clinic review tool, if applicable. No additional management support is needed unless otherwise documented below in the visit note. 

## 2016-10-09 NOTE — Progress Notes (Signed)
PCP notes:   Health maintenance:  Hep C screening - completed  Abnormal screenings:   None  Patient concerns:   None  Nurse concerns:  None  Next PCP appt:   10/30/16 @ 1400

## 2016-10-09 NOTE — Progress Notes (Signed)
I reviewed health advisor's note, was available for consultation, and agree with documentation and plan.  

## 2016-10-09 NOTE — Progress Notes (Signed)
Subjective:   Joann Buchanan is a 71 y.o. female who presents for Medicare Annual (Subsequent) preventive examination.  Review of Systems:  N/A Cardiac Risk Factors include: advanced age (>25men, >66 women)     Objective:     Vitals: BP 118/70 (BP Location: Right Arm, Patient Position: Sitting, Cuff Size: Normal)   Pulse 72   Temp 97.8 F (36.6 C) (Oral)   Ht 4' 11.75" (1.518 m) Comment: no shoes  Wt 140 lb 8 oz (63.7 kg)   SpO2 95%   BMI 27.67 kg/m   Body mass index is 27.67 kg/m.   Tobacco History  Smoking Status  . Never Smoker  Smokeless Tobacco  . Never Used     Counseling given: No   Past Medical History:  Diagnosis Date  . Allergic rhinitis   . GERD (gastroesophageal reflux disease)   . Goiter    ?   Marland Kitchen HTN (hypertension)   . Hx of fibrocystic disease of breast   . Melanoma ([place])   . Migraine   . Numbness and tingling    bilateral hands  . Osteoarthritis of hand   . PONV (postoperative nausea and vomiting)    Past Surgical History:  Procedure Laterality Date  . ANTERIOR CERVICAL DECOMP/DISCECTOMY FUSION N/A 12/02/2015   Procedure: ANTERIOR CERVICAL DECOMPRESSION/DISCECTOMY FUSION CERVICAL FOUR-FIVE;  Surgeon: Eustace Moore, MD;  Location: [place] [facility];  Service: Neurosurgery;  Laterality: N/A;  . APPENDECTOMY    . BREAST BIOPSY     normal  . CATARACT EXTRACTION Right Jan 2017  . DEXA  7/07   negative  . EMG     left upper arm- neg  . ESOPHAGOGASTRODUODENOSCOPY     normal-24 hour PH probe (-)  . EYE SURGERY  10/09   Left eye; for double vision  . PARTIAL HYSTERECTOMY     Ovaries intact  . TUBAL LIGATION     Family History  Problem Relation Age of Onset  . Breast cancer Mother     Died, 66  . Other Father     Died, 62 - tree fell on him  . Breast cancer Sister     Died, 1  . Heart disease Sister     Died, 84  . Healthy Son   . Healthy Daughter    History  Sexual Activity  . Sexual activity: No    Outpatient Encounter  Prescriptions as of 10/09/2016  Medication Sig  . ALPRAZolam (XANAX) 0.25 MG tablet Take 1 tablet (0.25 mg total) by mouth as needed. Prior to flights  . Aspirin-Salicylamide-Caffeine (BC HEADACHE) 325-95-16 MG TABS Take 1 packet by mouth daily as needed (for headache).  Marland Kitchen ibuprofen (ADVIL,MOTRIN) 200 MG tablet 200 mg.  . Ibuprofen-Diphenhydramine HCl 200-25 MG CAPS Take by mouth.  Marland Kitchen omeprazole (PRILOSEC) 40 MG capsule Take 1 capsule (40 mg total) by mouth daily as needed.  Vladimir Faster Glycol-Propyl Glycol 0.4-0.3 % SOLN Place 1 drop into both eyes as needed.  Marland Kitchen scopolamine (TRANSDERM-SCOP, 1.5 MG,) 1 MG/3DAYS Place 1 patch (1.5 mg total) onto the skin every 3 (three) days.  . SUMAtriptan (IMITREX) 20 MG/ACT nasal spray USE 1 SPRAY INTO THE NOSE EVERY 2 HOURS AS NEEDED FOR MIGRAINE. NO MORE THAN 2 DOSES IN 24 HOURS   No facility-administered encounter medications on file as of 10/09/2016.     Activities of Daily Living In your present state of health, do you have any difficulty performing the following activities: 10/09/2016 06/06/2016  Hearing? N  N  Vision? N N  Difficulty concentrating or making decisions? N N  Walking or climbing stairs? N N  Dressing or bathing? N N  Doing errands, shopping? N Y  Conservation officer, nature and eating ? N -  Using the Toilet? N -  In the past six months, have you accidently leaked urine? Y -  Do you have problems with loss of bowel control? N -  Managing your Medications? N -  Managing your Finances? N -  Housekeeping or managing your Housekeeping? N -  Some recent data might be hidden    Patient Care Team: Abner Greenspan, MD as PCP - General Marilynne Halsted, MD as Consulting Physician (Ophthalmology) Minna Merritts, MD as Consulting Physician (Cardiology)    Assessment:     Hearing Screening   125Hz  250Hz  500Hz  1000Hz  2000Hz  3000Hz  4000Hz  6000Hz  8000Hz   Right ear:   40 40 40  40    Left ear:   40 40 40  40    Vision Screening Comments: Last vision  exam in 2017 with Dr. Violeta Gelinas   Exercise Activities and Dietary recommendations Current Exercise Habits: Home exercise routine, Type of exercise: walking, Time (Minutes): 30, Frequency (Times/Week): 3, Weekly Exercise (Minutes/Week): 90, Intensity: Moderate, Exercise limited by: None identified  Goals    . Increase physical activity          When weather permits, I will begin walking for 5 miles daily 3 days per week.       Fall Risk Fall Risk  10/09/2016 06/28/2016 12/28/2015 10/10/2015 10/05/2015  Falls in the past year? No No No No No   Depression Screen PHQ 2/9 Scores 10/09/2016 10/05/2015 10/05/2015 03/03/2013  PHQ - 2 Score 0 0 0 0     Cognitive Function MMSE - Mini Mental State Exam 10/09/2016 10/05/2015  Orientation to time 5 5  Orientation to Place 5 5  Registration 3 3  Attention/ Calculation 0 0  Recall 3 3  Language- name 2 objects 0 0  Language- repeat 1 1  Language- follow 3 step command 3 3  Language- read & follow direction 0 0  Write a sentence 0 0  Copy design 0 0  Total score 20 20       PLEASE NOTE: A Mini-Cog screen was completed. Maximum score is 20. A value of 0 denotes this part of [place] MMSE was not completed or the patient failed this part of the Mini-Cog screening.   Mini-Cog Screening Orientation to Time - Max 5 pts Orientation to Place - Max 5 pts Registration - Max 3 pts Recall - Max 3 pts Language Repeat - Max 1 pts Language Follow 3 Step Command - Max 3 pts   Immunization History  Administered Date(s) Administered  . Td 05/08/2004  . Zoster 03/23/2009   Screening Tests Health Maintenance  Topic Date Due  . Fecal DNA (Cologuard)  10/10/2019 (Originally 07/27/1997)  . PNA vac Low Risk Adult (1 of 2 - PCV13) 09/07/2023 (Originally 07/27/2012)  . TETANUS/TDAP  05/07/2024 (Originally 05/08/2014)  . MAMMOGRAM  10/13/2016  . INFLUENZA VACCINE  02/06/2017  . COLON CANCER SCREENING ANNUAL FOBT  03/28/2017  . DEXA SCAN  Completed  .  Hepatitis C Screening  Completed      Plan:     I have personally reviewed and addressed the Medicare Annual Wellness questionnaire and have noted the following in the patient's chart:  A. Medical and social history B. Use of alcohol, tobacco or illicit drugs  C. Current medications and supplements D. Functional ability and status E.  Nutritional status F.  Physical activity G. Advance directives H. List of other physicians I.  Hospitalizations, surgeries, and ER visits in previous 12 months J.  [place] to include hearing, vision, cognitive, depression L. Referrals and appointments - none  In addition, I have reviewed and discussed with patient certain preventive protocols, quality metrics, and best practice recommendations. A written personalized care plan for preventive services as well as general preventive health recommendations were provided to patient.  See attached scanned questionnaire for additional information.   Signed,   Lindell Noe, MHA, BS, LPN Health Coach

## 2016-10-09 NOTE — Patient Instructions (Addendum)
Joann Buchanan , Thank you for taking time to come for your Medicare Wellness Visit. I appreciate your ongoing commitment to your health goals. Please review the following plan we discussed and let me know if I can assist you in the future.   These are the goals we discussed: Goals    . Increase physical activity          When weather permits, I will begin walking for 5 miles daily 3 days per week.        This is a list of the screening recommended for you and due dates:  Health Maintenance  Topic Date Due  . Cologuard (Stool DNA test)  10/10/2019*  . Pneumonia vaccines (1 of 2 - PCV13) 09/07/2023*  . Tetanus Vaccine  05/07/2024*  . Mammogram  10/13/2016  . Flu Shot  02/06/2017  . DEXA scan (bone density measurement)  Completed  .  Hepatitis C: One time screening is recommended by Center for Disease Control  (CDC) for  adults born from [birth year] through 1963.   Completed  *Topic was postponed. The date shown is not the original due date.     Preventive Care for Adults  A healthy lifestyle and preventive care can promote health and wellness. Preventive health guidelines for adults include the following key practices.  . A routine yearly physical is a good way to check with your health care provider about your health and preventive screening. It is a chance to share any concerns and updates on your health and to receive a thorough exam.  . Visit your dentist for a routine exam and preventive care every 6 months. Brush your teeth twice a day and floss once a day. Good oral hygiene prevents tooth decay and gum disease.  . The frequency of eye exams is based on your age, health, family medical history, use  of contact lenses, and other factors. Follow your health care provider's ecommendations for frequency of eye exams.  . Eat a healthy diet. Foods like vegetables, fruits, whole grains, low-fat dairy products, and lean protein foods contain the nutrients you need without too many calories.  Decrease your intake of foods high in solid fats, added sugars, and salt. Eat the right amount of calories for you. Get information about a proper diet from your health care provider, if necessary.  . Regular physical exercise is one of the most important things you can do for your health. Most adults should get at least 150 minutes of moderate-intensity exercise (any activity that increases your heart rate and causes you to sweat) each week. In addition, most adults need muscle-strengthening exercises on 2 or more days a week.  Silver Sneakers may be a benefit available to you. To determine eligibility, you may visit the website: www.silversneakers.com or contact program at 779 689 3013 Mon-Fri between 8AM-8PM.   . Maintain a healthy weight. The body mass index (BMI) is a screening tool to identify possible weight problems. It provides an estimate of body fat based on height and weight. Your health care provider can find your BMI and can help you achieve or maintain a healthy weight.   For adults 20 years and older: ? A BMI below 18.5 is considered underweight. ? A BMI of 18.5 to 24.9 is normal. ? A BMI of 25 to 29.9 is considered overweight. ? A BMI of 30 and above is considered obese.   . Maintain normal blood lipids and cholesterol levels by exercising and minimizing your intake of saturated fat. Eat  a balanced diet with plenty of fruit and vegetables. Blood tests for lipids and cholesterol should begin at age 76 and be repeated every 5 years. If your lipid or cholesterol levels are high, you are over 50, or you are at high risk for heart disease, you may need your cholesterol levels checked more frequently. Ongoing high lipid and cholesterol levels should be treated with medicines if diet and exercise are not working.  . If you smoke, find out from your health care provider how to quit. If you do not use tobacco, please do not start.  . If you choose to drink alcohol, please do not consume  more than 2 drinks per day. One drink is considered to be 12 ounces (355 mL) of beer, 5 ounces (148 mL) of wine, or 1.5 ounces (44 mL) of liquor.  . If you are 37-84 years old, ask your health care provider if you should take aspirin to prevent strokes.  . Use sunscreen. Apply sunscreen liberally and repeatedly throughout the day. You should seek shade when your shadow is shorter than you. Protect yourself by wearing long sleeves, pants, a wide-brimmed hat, and sunglasses year round, whenever you are outdoors.  . Once a month, do a whole body skin exam, using a mirror to look at the skin on your back. Tell your health care provider of new moles, moles that have irregular borders, moles that are larger than a pencil eraser, or moles that have changed in shape or color.

## 2016-10-10 LAB — HEPATITIS C ANTIBODY: HCV AB: NEGATIVE

## 2016-10-22 ENCOUNTER — Encounter: Payer: Medicare Other | Admitting: Family Medicine

## 2016-10-30 ENCOUNTER — Ambulatory Visit (INDEPENDENT_AMBULATORY_CARE_PROVIDER_SITE_OTHER): Payer: Medicare Other | Admitting: Family Medicine

## 2016-10-30 ENCOUNTER — Encounter: Payer: Self-pay | Admitting: Family Medicine

## 2016-10-30 VITALS — BP 116/68 | HR 71 | Temp 98.2°F | Ht 59.75 in | Wt 140.0 lb

## 2016-10-30 DIAGNOSIS — I10 Essential (primary) hypertension: Secondary | ICD-10-CM | POA: Diagnosis not present

## 2016-10-30 DIAGNOSIS — Z1231 Encounter for screening mammogram for malignant neoplasm of breast: Secondary | ICD-10-CM | POA: Diagnosis not present

## 2016-10-30 DIAGNOSIS — Z981 Arthrodesis status: Secondary | ICD-10-CM | POA: Diagnosis not present

## 2016-10-30 DIAGNOSIS — M8580 Other specified disorders of bone density and structure, unspecified site: Secondary | ICD-10-CM | POA: Diagnosis not present

## 2016-10-30 DIAGNOSIS — Z0000 Encounter for general adult medical examination without abnormal findings: Secondary | ICD-10-CM | POA: Diagnosis not present

## 2016-10-30 DIAGNOSIS — Z85820 Personal history of malignant melanoma of skin: Secondary | ICD-10-CM | POA: Diagnosis not present

## 2016-10-30 MED ORDER — SUMATRIPTAN 20 MG/ACT NA SOLN: NASAL | 11 refills | Status: DC

## 2016-10-30 MED ORDER — OMEPRAZOLE 40 MG PO CPDR: 40.0000 mg | DELAYED_RELEASE_CAPSULE | Freq: Every day | ORAL | 11 refills | Status: DC | PRN

## 2016-10-30 NOTE — Progress Notes (Signed)
Subjective:    Patient ID: Joann Buchanan, female    DOB: 07/23/1945, 71 y.o.   MRN: [id number]  HPI Here for health maintenance exam and to review chronic medical problems    Feeling good  Went on a cruise and that went well - had a good time   She had AMW visit 4/3  Hep C screen was completed-neg  No concerns   Wt Readings from Last 3 Encounters:  10/30/16 140 lb (63.5 kg)  10/09/16 140 lb 8 oz (63.7 kg)  07/13/16 139 lb 8 oz (63.3 kg)  stable -no change  Did not gain wt on the cruises Would like to loose some wt  She has cut back on eating- slim fast/ egg /chicken -no wt loss Not exercising as much  bmi 27.5  Mammogram 4/17 neg Breast ca hx in mother and sister (mother was in 63s and sister 52s) Self breast exam - no lumps or changes , did have and itchy area   Colon cancer screening    ifob neg 02 and neg (outside source 9/17) Declines further colon cancer screening   Declined pna vaccines   Tetanus vaccine 10/05-will get that at a pharmacy   Zoster vaccine 9/10 May consider Shingrix - in the future   Flu vaccine - does not get   dexa 4/17 osteopenia at the FN  slt dec in bmd  She is not taking calcium and D  No falls No fracture    Remote hx of melanoma - has not seen dermatology -needs referral   Hysterectomy in the past  No gyn symptoms    Cholesterol Lab Results  Component Value Date   CHOL 191 10/09/2016   CHOL 175 06/06/2016   CHOL 225 (H) 09/28/2015   Lab Results  Component Value Date   HDL 55.50 10/09/2016   HDL 52 06/06/2016   HDL 51.70 09/28/2015   Lab Results  Component Value Date   LDLCALC 105 (H) 10/09/2016   LDLCALC 76 06/06/2016   LDLCALC 94 03/03/2013   Lab Results  Component Value Date   TRIG 152.0 (H) 10/09/2016   TRIG 234 (H) 06/06/2016   TRIG 242.0 (H) 09/28/2015   Lab Results  Component Value Date   CHOLHDL 3 10/09/2016   CHOLHDL 3.4 06/06/2016   CHOLHDL 4 09/28/2015   Lab Results  Component Value Date   LDLDIRECT 124.0 09/28/2015   Eats quite healthy diet overall  Ratio is better but LDL is up   Lab Results  Component Value Date   WBC 6.6 10/09/2016   HGB 13.3 10/09/2016   HCT 39.4 10/09/2016   MCV 88.7 10/09/2016   PLT 421.0 (H) 10/09/2016      Chemistry      Component Value Date/Time   NA 139 10/09/2016 1413   K 4.2 10/09/2016 1413   CL 107 10/09/2016 1413   CO2 28 10/09/2016 1413   BUN 16 10/09/2016 1413   CREATININE 0.87 10/09/2016 1413      Component Value Date/Time   CALCIUM 10.1 10/09/2016 1413   ALKPHOS 78 10/09/2016 1413   AST 20 10/09/2016 1413   ALT 21 10/09/2016 1413   BILITOT 0.4 10/09/2016 1413      Lab Results  Component Value Date   TSH 0.84 10/09/2016     Review of Systems Review of Systems  Constitutional: Negative for fever, appetite change, fatigue and unexpected weight change.  Eyes: Negative for pain and visual disturbance.  Respiratory: Negative for cough  and shortness of breath.   Cardiovascular: Negative for cp or palpitations   more pedal edema on her cruise  Gastrointestinal: Negative for nausea, diarrhea and constipation.  Genitourinary: Negative for urgency and frequency.  Skin: Negative for pallor or rash   Neurological: Negative for weakness, light-headedness, numbness and pos for migrain headaches. pos for carpal tunnel R hand (she will return to Dr Posey Pronto)  Hematological: Negative for adenopathy. Does not bruise/bleed easily.  Psychiatric/Behavioral: Negative for dysphoric mood. The patient is not nervous/anxious.         Objective:   Physical Exam  Constitutional: She appears well-developed and well-nourished. No distress.  overwt and well appearing   HENT:  Head: Normocephalic and atraumatic.  Right Ear: External ear normal.  Left Ear: External ear normal.  Mouth/Throat: Oropharynx is clear and moist.  Eyes: Conjunctivae and EOM are normal. Pupils are equal, round, and reactive to light. No scleral icterus.  Neck: Normal  range of motion. Neck supple. No JVD present. Carotid bruit is not present. No thyromegaly present.  Cardiovascular: Normal rate, regular rhythm, normal heart sounds and intact distal pulses.  Exam reveals no gallop.   Pulmonary/Chest: Effort normal and breath sounds normal. No respiratory distress. She has no wheezes. She exhibits no tenderness.  Abdominal: Soft. Bowel sounds are normal. She exhibits no distension, no abdominal bruit and no mass. There is no tenderness.  Genitourinary: No breast swelling, tenderness, discharge or bleeding.  Genitourinary Comments: Breast exam: No mass, nodules, thickening, tenderness, bulging, retraction, inflamation, nipple discharge or skin changes noted.  No axillary or clavicular LA.      Musculoskeletal: Normal range of motion. She exhibits no edema or tenderness.  Lymphadenopathy:    She has no cervical adenopathy.  Neurological: She is alert. She has normal reflexes. No cranial nerve deficit. She exhibits normal muscle tone. Coordination normal.  Skin: Skin is warm and dry. No rash noted. No erythema. No pallor.  Lentigines and solar aging noted  Psychiatric: She has a normal mood and affect.          Assessment & Plan:   Problem List Items Addressed This Visit      Cardiovascular and Mediastinum   Essential hypertension - Primary    bp in fair control at this time  BP Readings from Last 1 Encounters:  10/30/16 116/68   No changes needed Disc lifstyle change with low sodium diet and exercise  Labs reviewed         Musculoskeletal and Integument   Osteopenia    Rev dexa 4/17 Osteopenia FN Enc her to start back on ca and D No falls or fx Re check in a year         Other   Encounter for screening mammogram for breast cancer    Scheduled annual screening mammogram Nl breast exam today  Encouraged monthly self exams        Relevant Orders   MM DIGITAL SCREENING BILATERAL   History of melanoma    Pt has not been for derm f/u    Enc her to do so  Referral done Disc sun protection and skin care       Relevant Orders   Ambulatory referral to Dermatology   Routine general medical examination at a health care facility    Reviewed health habits including diet and exercise and skin cancer prevention Reviewed appropriate screening tests for age  Also reviewed health mt list, fam hx and immunization status , as well as social  and family history   See HPI Labs reviewed  Enc pt to consider genetic testing in light of strong fam hx of breast cancer Order done for mammogram  Declines colon cancer screening and flu and pneumonia vaccines  Considering new shingles vaccine if covered  Overdue for derm visit with hx of melanoma-ref done for that       S/P cervical spinal fusion    Continues to do well

## 2016-10-30 NOTE — Assessment & Plan Note (Signed)
Reviewed health habits including diet and exercise and skin cancer prevention Reviewed appropriate screening tests for age  Also reviewed health mt list, fam hx and immunization status , as well as social and family history   See HPI Labs reviewed  Enc pt to consider genetic testing in light of strong fam hx of breast cancer Order done for mammogram  Declines colon cancer screening and flu and pneumonia vaccines  Considering new shingles vaccine if covered  Overdue for derm visit with hx of melanoma-ref done for that

## 2016-10-30 NOTE — Assessment & Plan Note (Signed)
bp in fair control at this time  BP Readings from Last 1 Encounters:  10/30/16 116/68   No changes needed Disc lifstyle change with low sodium diet and exercise  Labs reviewed

## 2016-10-30 NOTE — Assessment & Plan Note (Signed)
Pt has not been for derm f/u  Enc her to do so  Referral done Disc sun protection and skin care

## 2016-10-30 NOTE — Progress Notes (Signed)
Pre visit review using our clinic review tool, if applicable. No additional management support is needed unless otherwise documented below in the visit note. 

## 2016-10-30 NOTE — Patient Instructions (Addendum)
Please stop at check out for referral for mammogram  If you are ever interested in genetic screening through the cancer center please let us know We will refer you to dermatology for skin exam   Get your tetanus shot at a pharmacy- this is due   If you are interested in a shingles/zoster vaccine - call your insurance to check on coverage,( you should not get it within 1 month of other vaccines) , then call us for a prescription  for it to take to a pharmacy that gives the shot , or make a nurse visit to get it here depending on your coverage  this is new-called Shingrix   Try to get 1200-1500 mg of calcium per day with at least 1000 iu of vitamin D - for bone health   Watch diet for cholesterol Avoid red meat/ fried foods/ egg yolks/ fatty breakfast meats/ butter, cheese and high fat dairy/ and shellfish    Stay well hydrated to prevent migraines

## 2016-10-30 NOTE — Assessment & Plan Note (Signed)
Scheduled annual screening mammogram Nl breast exam today  Encouraged monthly self exams   

## 2016-10-30 NOTE — Assessment & Plan Note (Signed)
Continues to do well 

## 2016-10-30 NOTE — Assessment & Plan Note (Signed)
Rev dexa 4/17 Osteopenia FN Enc her to start back on ca and D No falls or fx Re check in a year

## 2016-12-28 ENCOUNTER — Ambulatory Visit: Payer: Medicare Other | Admitting: Neurology

## 2017-04-16 ENCOUNTER — Encounter: Payer: Self-pay | Admitting: Family Medicine

## 2017-06-18 ENCOUNTER — Other Ambulatory Visit: Payer: Self-pay | Admitting: Family Medicine

## 2017-06-18 NOTE — Telephone Encounter (Signed)
Copied from [place] [id number]. Topic: Quick Communication - Rx Refill/Question >> Jun 18, 2017 11:50 AM Patrice Paradise wrote: Has the patient contacted their pharmacy? Yes.   ALPRAZolam (XANAX) 0.25 MG tablet  (Agent: If no, request that the patient contact the pharmacy for the refill.)  Preferred Pharmacy (with phone number or street name):  [id number] - [place], [address] [place] [address] Phone: [phone] Fax: [phone]   Agent: Please be advised that RX refills may take up to 3 business days. We ask that you follow-up with your pharmacy.

## 2017-06-18 NOTE — Telephone Encounter (Signed)
Xanax  Refill

## 2017-06-19 MED ORDER — ALPRAZOLAM 0.25 MG PO TABS: 0.2500 mg | ORAL_TABLET | ORAL | 0 refills | Status: DC | PRN

## 2017-06-19 NOTE — Telephone Encounter (Signed)
Px written for call in   

## 2017-06-20 ENCOUNTER — Telehealth: Payer: Self-pay | Admitting: Family Medicine

## 2017-06-20 NOTE — Telephone Encounter (Signed)
Rx called in as prescribed 

## 2017-06-20 NOTE — Telephone Encounter (Signed)
Copied from [place] [id number]. Topic: Quick Communication - Rx Refill/Question >> Jun 20, 2017  4:23 PM Marin Olp L wrote: Has the patient contacted their pharmacy? Yes.   (Agent: If no, request that the patient contact the pharmacy for the refill.) Preferred Pharmacy (with phone number or street name): [id number] [place], [place]: Please be advised that RX refills may take up to 3 business days. We ask that you follow-up with your pharmacy. Xanax (pt needs it to travel, leaving Saturday morning 12/15) originally called for this on 12/11, advised on time frame.

## 2017-06-20 NOTE — Telephone Encounter (Signed)
Xanax refill, was sent on 12/12 via [facility]. Pt asking to be sent to [place], [place]. Pt needs to travel and leaving Saturday morning.

## 2017-06-21 NOTE — Telephone Encounter (Signed)
Per last phone note [facility]. Called pharmacy and pt has already pick up Rx this morning

## 2017-06-27 ENCOUNTER — Ambulatory Visit (INDEPENDENT_AMBULATORY_CARE_PROVIDER_SITE_OTHER): Payer: Medicare Other | Admitting: Family Medicine

## 2017-06-27 ENCOUNTER — Encounter: Payer: Self-pay | Admitting: Family Medicine

## 2017-06-27 VITALS — BP 118/62 | HR 93 | Temp 98.2°F | Wt 138.2 lb

## 2017-06-27 DIAGNOSIS — J069 Acute upper respiratory infection, unspecified: Secondary | ICD-10-CM | POA: Diagnosis not present

## 2017-06-27 DIAGNOSIS — J0100 Acute maxillary sinusitis, unspecified: Secondary | ICD-10-CM

## 2017-06-27 MED ORDER — AZITHROMYCIN 250 MG PO TABS: ORAL_TABLET | ORAL | 0 refills | Status: DC

## 2017-06-27 MED ORDER — GUAIFENESIN-CODEINE 100-10 MG/5ML PO SYRP: 5.0000 mL | ORAL_SOLUTION | Freq: Two times a day (BID) | ORAL | 0 refills | Status: DC | PRN

## 2017-06-27 NOTE — Assessment & Plan Note (Signed)
Not consistent with strep pharyngitis.

## 2017-06-27 NOTE — Patient Instructions (Signed)
I do think you have sinusitis and pharyngitis.  Treat with zpack antibiotic.  cheratussin for cough at night time.  Push fluids and plenty of rest. May use ibuprofen 400-600mg  with meals for throat inflammation.  Salt water gargles.  Suck on cold things like popsicles or warm things like herbal teas (whichever soothes the throat better).

## 2017-06-27 NOTE — Progress Notes (Signed)
BP 118/62 (BP Location: Left Arm, Patient Position: Sitting, Cuff Size: Normal)   Pulse 93   Temp 98.2 F (36.8 C) (Oral)   Wt 138 lb 4 oz (62.7 kg)   SpO2 95%   BMI 27.23 kg/m    CC: ST, fever, cough Subjective:    Patient ID: Joann Buchanan, female    DOB: 1946-01-02, 71 y.o.   MRN: [id number]  HPI: Joann Buchanan is a 71 y.o. female presenting on 06/27/2017 for Sore Throat (Started 06/24/17 while in [place]. Took Motrin this morning); Fever; Cough (productive, hurts in chest. ); and Ear Fullness (Ears were popping on the plane yesterday and now when she blows her nose)   5d h/o ST, ear fullness/popping/itching, feverish yesterday. More fatigued. Mildly productive cough. Mild exertional dyspnea. Slightly dizzy. Chest > head congestion, ongoing sore throat and pain. Cough keeping her up at night.   No headaches, facial pain, sinus pressure, PNdrainage, or wheeze. No body aches.   Has been taking children's motrin liquid.  No direct sick contacts at home.  No smokers at home.  No h/o asthma.  Recent trip to [place] - thinks air travel contributed to this.   Relevant past medical, surgical, family and social history reviewed and updated as indicated. Interim medical history since our last visit reviewed. Allergies and medications reviewed and updated. Outpatient Medications Prior to Visit  Medication Sig Dispense Refill  . ALPRAZolam (XANAX) 0.25 MG tablet Take 1 tablet (0.25 mg total) by mouth as needed. Prior to flights 30 tablet 0  . Aspirin-Salicylamide-Caffeine (BC HEADACHE) 325-95-16 MG TABS Take 1 packet by mouth daily as needed (for headache).    Marland Kitchen ibuprofen (ADVIL,MOTRIN) 200 MG tablet 200 mg.    . Ibuprofen-Diphenhydramine HCl 200-25 MG CAPS Take by mouth.    Marland Kitchen omeprazole (PRILOSEC) 40 MG capsule Take 1 capsule (40 mg total) by mouth daily as needed. 30 capsule 11  . Polyethyl Glycol-Propyl Glycol 0.4-0.3 % SOLN Place 1 drop into both eyes as needed.    Marland Kitchen scopolamine  (TRANSDERM-SCOP, 1.5 MG,) 1 MG/3DAYS Place 1 patch (1.5 mg total) onto the skin every 3 (three) days. 10 patch 0  . SUMAtriptan (IMITREX) 20 MG/ACT nasal spray USE 1 SPRAY INTO THE NOSE EVERY 2 HOURS AS NEEDED FOR MIGRAINE. NO MORE THAN 2 DOSES IN 24 HOURS 1 Inhaler 11   No facility-administered medications prior to visit.      Per HPI unless specifically indicated in ROS section below Review of Systems     Objective:    BP 118/62 (BP Location: Left Arm, Patient Position: Sitting, Cuff Size: Normal)   Pulse 93   Temp 98.2 F (36.8 C) (Oral)   Wt 138 lb 4 oz (62.7 kg)   SpO2 95%   BMI 27.23 kg/m   Wt Readings from Last 3 Encounters:  06/27/17 138 lb 4 oz (62.7 kg)  10/30/16 140 lb (63.5 kg)  10/09/16 140 lb 8 oz (63.7 kg)    Physical Exam  Constitutional: She appears well-developed and well-nourished. No distress.  HENT:  Head: Normocephalic and atraumatic.  Right Ear: Hearing, tympanic membrane, external ear and ear canal normal.  Left Ear: Hearing, external ear and ear canal normal.  Nose: Mucosal edema (mild) present. No rhinorrhea. Right sinus exhibits maxillary sinus tenderness. Right sinus exhibits no frontal sinus tenderness. Left sinus exhibits maxillary sinus tenderness. Left sinus exhibits no frontal sinus tenderness.  Mouth/Throat: Uvula is midline and mucous membranes are normal. Posterior oropharyngeal edema  and posterior oropharyngeal erythema present. No oropharyngeal exudate or tonsillar abscesses.  Erythema of L TM  Eyes: Conjunctivae and EOM are normal. Pupils are equal, round, and reactive to light. No scleral icterus.  Neck: Normal range of motion. Neck supple.  Cardiovascular: Normal rate, regular rhythm, normal heart sounds and intact distal pulses.  No murmur heard. Pulmonary/Chest: Effort normal and breath sounds normal. No respiratory distress. She has no wheezes. She has no rales.  coarse  Lymphadenopathy:    She has no cervical adenopathy.  Skin:  Skin is warm and dry. No rash noted.  Nursing note and vitals reviewed.     Assessment & Plan:   Problem List Items Addressed This Visit    Acute sinusitis - Primary    Multiple systems affected - throat, sinuses, now bronchitis with significant sinus pain, feverish and worsening symptoms last night - will cover with zpack antibiotic.  cheratussin for cough.  Supportive care as per instructions.  Update if not improving with treatment.       Relevant Medications   guaiFENesin-codeine (CHERATUSSIN AC) 100-10 MG/5ML syrup   azithromycin (ZITHROMAX) 250 MG tablet   Acute upper respiratory infection    Not consistent with strep pharyngitis.       Relevant Medications   azithromycin (ZITHROMAX) 250 MG tablet       Follow up plan: No Follow-up on file.  Ria Bush, MD

## 2017-06-27 NOTE — Assessment & Plan Note (Signed)
Multiple systems affected - throat, sinuses, now bronchitis with significant sinus pain, feverish and worsening symptoms last night - will cover with zpack antibiotic.  cheratussin for cough.  Supportive care as per instructions.  Update if not improving with treatment.

## 2017-08-12 ENCOUNTER — Observation Stay (HOSPITAL_COMMUNITY)
Admission: EM | Admit: 2017-08-12 | Discharge: 2017-08-14 | Disposition: A | Discharge status: Home / Self Care | Payer: Medicare Other | Attending: Internal Medicine | Admitting: Internal Medicine

## 2017-08-12 ENCOUNTER — Emergency Department (HOSPITAL_COMMUNITY): Payer: Medicare Other

## 2017-08-12 ENCOUNTER — Encounter (HOSPITAL_COMMUNITY): Payer: Self-pay | Admitting: Emergency Medicine

## 2017-08-12 DIAGNOSIS — I10 Essential (primary) hypertension: Secondary | ICD-10-CM | POA: Insufficient documentation

## 2017-08-12 DIAGNOSIS — R197 Diarrhea, unspecified: Secondary | ICD-10-CM | POA: Diagnosis not present

## 2017-08-12 DIAGNOSIS — R072 Precordial pain: Secondary | ICD-10-CM | POA: Diagnosis present

## 2017-08-12 DIAGNOSIS — Z79899 Other long term (current) drug therapy: Secondary | ICD-10-CM | POA: Diagnosis not present

## 2017-08-12 DIAGNOSIS — Z85820 Personal history of malignant melanoma of skin: Secondary | ICD-10-CM | POA: Insufficient documentation

## 2017-08-12 DIAGNOSIS — R079 Chest pain, unspecified: Secondary | ICD-10-CM | POA: Diagnosis present

## 2017-08-12 DIAGNOSIS — R05 Cough: Secondary | ICD-10-CM | POA: Diagnosis not present

## 2017-08-12 DIAGNOSIS — R0602 Shortness of breath: Secondary | ICD-10-CM | POA: Insufficient documentation

## 2017-08-12 LAB — CBC
HEMATOCRIT: 40.2 % (ref 36.0–46.0)
HEMOGLOBIN: 13.6 g/dL (ref 12.0–15.0)
MCH: 30.2 pg (ref 26.0–34.0)
MCHC: 33.8 g/dL (ref 30.0–36.0)
MCV: 89.1 fL (ref 78.0–100.0)
Platelets: 413 10*3/uL — ABNORMAL HIGH (ref 150–400)
RBC: 4.51 MIL/uL (ref 3.87–5.11)
RDW: 13.5 % (ref 11.5–15.5)
WBC: 10.9 10*3/uL — AB (ref 4.0–10.5)

## 2017-08-12 LAB — BASIC METABOLIC PANEL
ANION GAP: 12 (ref 5–15)
BUN: 21 mg/dL — ABNORMAL HIGH (ref 6–20)
CALCIUM: 10.4 mg/dL — AB (ref 8.9–10.3)
CO2: 21 mmol/L — ABNORMAL LOW (ref 22–32)
Chloride: 106 mmol/L (ref 101–111)
Creatinine, Ser: 1.17 mg/dL — ABNORMAL HIGH (ref 0.44–1.00)
GFR, EST AFRICAN AMERICAN: 53 mL/min — AB (ref >60)
GFR, EST NON AFRICAN AMERICAN: 46 mL/min — AB (ref >60)
Glucose, Bld: 117 mg/dL — ABNORMAL HIGH (ref 65–99)
POTASSIUM: 3.6 mmol/L (ref 3.5–5.1)
Sodium: 139 mmol/L (ref 135–145)

## 2017-08-12 LAB — I-STAT TROPONIN, ED
TROPONIN I, POC: 0.04 ng/mL (ref 0.00–0.08)
TROPONIN I, POC: 0.06 ng/mL (ref 0.00–0.08)

## 2017-08-12 LAB — D-DIMER, QUANTITATIVE (NOT AT ARMC)

## 2017-08-12 MED ORDER — IPRATROPIUM-ALBUTEROL 0.5-2.5 (3) MG/3ML IN SOLN
3.0000 mL | Freq: Once | RESPIRATORY_TRACT | Status: AC
  Administered 2017-08-12: 3 mL via RESPIRATORY_TRACT
  Filled 2017-08-12: qty 3

## 2017-08-12 MED ORDER — NITROGLYCERIN 0.4 MG SL SUBL
0.4000 mg | SUBLINGUAL_TABLET | SUBLINGUAL | Status: DC | PRN
  Administered 2017-08-12 (×3): 0.4 mg via SUBLINGUAL
  Filled 2017-08-12: qty 1
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
## 2017-08-12 NOTE — ED Notes (Signed)
Patient still complaining of 5/10 left sided chest pain and exertional dyspnea while walking to bathroom.  EDP made aware.

## 2017-08-12 NOTE — ED Notes (Signed)
Transported to XR  
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
## 2017-08-13 ENCOUNTER — Observation Stay (HOSPITAL_COMMUNITY): Payer: Medicare Other

## 2017-08-13 ENCOUNTER — Observation Stay (HOSPITAL_BASED_OUTPATIENT_CLINIC_OR_DEPARTMENT_OTHER): Payer: Medicare Other

## 2017-08-13 ENCOUNTER — Other Ambulatory Visit: Payer: Self-pay

## 2017-08-13 DIAGNOSIS — R079 Chest pain, unspecified: Secondary | ICD-10-CM

## 2017-08-13 DIAGNOSIS — K219 Gastro-esophageal reflux disease without esophagitis: Secondary | ICD-10-CM | POA: Diagnosis not present

## 2017-08-13 DIAGNOSIS — R748 Abnormal levels of other serum enzymes: Secondary | ICD-10-CM

## 2017-08-13 DIAGNOSIS — R072 Precordial pain: Secondary | ICD-10-CM | POA: Diagnosis not present

## 2017-08-13 LAB — TROPONIN I
TROPONIN I: 0.03 ng/mL — AB (ref <0.03)
Troponin I: 0.06 ng/mL (ref <0.03)

## 2017-08-13 LAB — COMPREHENSIVE METABOLIC PANEL
ALT: 28 U/L (ref 14–54)
AST: 28 U/L (ref 15–41)
Albumin: 3.6 g/dL (ref 3.5–5.0)
Alkaline Phosphatase: 73 U/L (ref 38–126)
Anion gap: 12 (ref 5–15)
BILIRUBIN TOTAL: 0.7 mg/dL (ref 0.3–1.2)
BUN: 17 mg/dL (ref 6–20)
CHLORIDE: 109 mmol/L (ref 101–111)
CO2: 21 mmol/L — ABNORMAL LOW (ref 22–32)
CREATININE: 1.02 mg/dL — AB (ref 0.44–1.00)
Calcium: 9.9 mg/dL (ref 8.9–10.3)
GFR, EST NON AFRICAN AMERICAN: 54 mL/min — AB (ref >60)
Glucose, Bld: 97 mg/dL (ref 65–99)
POTASSIUM: 3.9 mmol/L (ref 3.5–5.1)
Sodium: 142 mmol/L (ref 135–145)
TOTAL PROTEIN: 5.8 g/dL — AB (ref 6.5–8.1)

## 2017-08-13 LAB — CBC
HEMATOCRIT: 36.8 % (ref 36.0–46.0)
Hemoglobin: 12.1 g/dL (ref 12.0–15.0)
MCH: 29.4 pg (ref 26.0–34.0)
MCHC: 32.9 g/dL (ref 30.0–36.0)
MCV: 89.3 fL (ref 78.0–100.0)
PLATELETS: 384 10*3/uL (ref 150–400)
RBC: 4.12 MIL/uL (ref 3.87–5.11)
RDW: 13.9 % (ref 11.5–15.5)
WBC: 7.7 10*3/uL (ref 4.0–10.5)

## 2017-08-13 LAB — LIPID PANEL
CHOL/HDL RATIO: 3.6 ratio
CHOLESTEROL: 183 mg/dL (ref 0–200)
HDL: 51 mg/dL (ref >40)
LDL Cholesterol: 100 mg/dL — ABNORMAL HIGH (ref 0–99)
Triglycerides: 160 mg/dL — ABNORMAL HIGH (ref <150)
VLDL: 32 mg/dL (ref 0–40)

## 2017-08-13 LAB — TSH: TSH: 3.765 u[IU]/mL (ref 0.350–4.500)

## 2017-08-13 LAB — ECHOCARDIOGRAM COMPLETE

## 2017-08-13 LAB — HEMOGLOBIN A1C
HEMOGLOBIN A1C: 5.5 % (ref 4.8–5.6)
MEAN PLASMA GLUCOSE: 111.15 mg/dL

## 2017-08-13 MED ORDER — METOPROLOL TARTRATE 5 MG/5ML IV SOLN
INTRAVENOUS | Status: AC
  Administered 2017-08-13: 5 mg
  Filled 2017-08-13: qty 20

## 2017-08-13 MED ORDER — METOPROLOL TARTRATE 5 MG/5ML IV SOLN
5.0000 mg | INTRAVENOUS | Status: AC | PRN
  Administered 2017-08-13 (×3): 5 mg via INTRAVENOUS

## 2017-08-13 MED ORDER — PANTOPRAZOLE SODIUM 40 MG PO TBEC
40.0000 mg | DELAYED_RELEASE_TABLET | Freq: Every day | ORAL | Status: DC
  Administered 2017-08-13: 40 mg via ORAL
  Filled 2017-08-13: qty 1

## 2017-08-13 MED ORDER — INSULIN ASPART 100 UNIT/ML ~~LOC~~ SOLN: 0.0000 [IU] | Freq: Three times a day (TID) | SUBCUTANEOUS | Status: DC

## 2017-08-13 MED ORDER — NITROGLYCERIN 0.4 MG SL SUBL
0.4000 mg | SUBLINGUAL_TABLET | Freq: Once | SUBLINGUAL | Status: AC
  Administered 2017-08-13: 0.8 mg via SUBLINGUAL

## 2017-08-13 MED ORDER — NITROGLYCERIN 0.4 MG SL SUBL
SUBLINGUAL_TABLET | SUBLINGUAL | Status: AC
  Filled 2017-08-13: qty 2

## 2017-08-13 MED ORDER — ASPIRIN 325 MG PO TABS
325.0000 mg | ORAL_TABLET | Freq: Once | ORAL | Status: AC
  Administered 2017-08-13: 325 mg via ORAL
  Filled 2017-08-13: qty 1

## 2017-08-13 MED ORDER — DILTIAZEM HCL 25 MG/5ML IV SOLN
5.0000 mg | Freq: Once | INTRAVENOUS | Status: AC
  Administered 2017-08-13: 5 mg via INTRAVENOUS
  Filled 2017-08-13: qty 5

## 2017-08-13 MED ORDER — ACETAMINOPHEN 650 MG RE SUPP: 650.0000 mg | Freq: Four times a day (QID) | RECTAL | Status: DC | PRN

## 2017-08-13 MED ORDER — IOPAMIDOL (ISOVUE-370) INJECTION 76%
INTRAVENOUS | Status: AC
  Administered 2017-08-13: 100 mL
  Filled 2017-08-13: qty 100

## 2017-08-13 MED ORDER — ACETAMINOPHEN 325 MG PO TABS
650.0000 mg | ORAL_TABLET | Freq: Four times a day (QID) | ORAL | Status: DC | PRN
  Administered 2017-08-13 (×2): 650 mg via ORAL
  Filled 2017-08-13 (×2): qty 2

## 2017-08-13 MED ORDER — INSULIN ASPART 100 UNIT/ML ~~LOC~~ SOLN: 0.0000 [IU] | Freq: Every day | SUBCUTANEOUS | Status: DC

## 2017-08-13 NOTE — ED Notes (Signed)
Patient denies pain and is resting comfortably.  

## 2017-08-13 NOTE — Progress Notes (Signed)
  Echocardiogram 2D Echocardiogram has been performed.  Joann Buchanan [name] 08/13/2017, 9:58 AM
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
## 2017-08-13 NOTE — ED Notes (Addendum)
Spoke with Dr. Gerlean Ren about insulin and CBG order. Pt is not a diabetic.  Verbal order to cancel orders for MD due to error. Verified with pt of non-diabetic status.

## 2017-08-13 NOTE — Progress Notes (Signed)
   No CAD by CT scan.  No significant structural heart disease.  No further cardiac w/u required.    Jettie Booze, MD

## 2017-08-13 NOTE — Progress Notes (Signed)
Discussed  With ED  About pt presentation   And CP relieved  With nitro  EKG- Non ACS features, No EKG   Changes suggestive of  Ischemia, Troponinx2  Negative, advised  Ok  For  IM admit  And  Trend  enzyems  And  Call cardiology in AM if  Still required  pt  Has  Any  Significant symptoms  And  Or  If  Any troponin  Elevation .
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
## 2017-08-13 NOTE — Progress Notes (Signed)
Called report to [facility] pt ct heart

## 2017-08-13 NOTE — Plan of Care (Signed)
Pt up ad lib. Ambulates independently with ease. Educated on use of call light system. Verbalizes understanding of need to call for assistance prior to ambulation when necessary. 

## 2017-08-13 NOTE — ED Notes (Addendum)
Date and time results received: 08/13/17 0534 (use smartphrase ".now" to insert current time)  Test: Troponin Critical Value: 0.06  Name of Provider Notified: Dr Reesa Chew Orders Received? Or Actions Taken?: none at this time.

## 2017-08-14 ENCOUNTER — Telehealth: Payer: Self-pay | Admitting: *Deleted

## 2017-08-14 DIAGNOSIS — I10 Essential (primary) hypertension: Secondary | ICD-10-CM | POA: Diagnosis not present

## 2017-08-14 DIAGNOSIS — K219 Gastro-esophageal reflux disease without esophagitis: Secondary | ICD-10-CM | POA: Diagnosis not present

## 2017-08-14 DIAGNOSIS — R072 Precordial pain: Secondary | ICD-10-CM

## 2017-08-14 DIAGNOSIS — E785 Hyperlipidemia, unspecified: Secondary | ICD-10-CM

## 2017-08-14 MED ORDER — ASPIRIN EC 81 MG PO TBEC: 81.0000 mg | DELAYED_RELEASE_TABLET | Freq: Every day | ORAL | 0 refills | Status: AC

## 2017-08-14 NOTE — Discharge Instructions (Signed)

## 2017-08-14 NOTE — Telephone Encounter (Signed)
Attempted to contact pt to complete TCM; unable to leave vm 
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
## 2017-08-14 NOTE — Care Management Obs Status (Signed)
[facility] NOTIFICATION   Patient Details  Name: Joann Buchanan MRN: [id number] Date of Birth: 29-Jan-1946   Medicare Observation Status Notification Given:  Yes    Carles Collet, RN 08/14/2017, 8:54 AM

## 2017-08-15 NOTE — Telephone Encounter (Signed)
Attempted to contact pt; unable to leave vm 

## 2017-10-09 ENCOUNTER — Telehealth: Payer: Self-pay | Admitting: Family Medicine

## 2017-10-09 DIAGNOSIS — I10 Essential (primary) hypertension: Secondary | ICD-10-CM

## 2017-10-09 DIAGNOSIS — R739 Hyperglycemia, unspecified: Secondary | ICD-10-CM

## 2017-10-09 NOTE — Telephone Encounter (Signed)
-----   Message from Eustace Pen, LPN sent at 11/10/7320  8:58 AM EDT ----- Regarding: Labs 4/4 Lab orders needed. Thank you.  Insurance:  [facility] Medicare

## 2017-10-10 ENCOUNTER — Ambulatory Visit: Payer: Medicare Other

## 2017-10-10 ENCOUNTER — Ambulatory Visit (INDEPENDENT_AMBULATORY_CARE_PROVIDER_SITE_OTHER): Payer: Medicare Other

## 2017-10-10 VITALS — BP 110/84 | HR 65 | Temp 97.7°F | Ht 59.5 in | Wt 138.8 lb

## 2017-10-10 DIAGNOSIS — I10 Essential (primary) hypertension: Secondary | ICD-10-CM

## 2017-10-10 DIAGNOSIS — R739 Hyperglycemia, unspecified: Secondary | ICD-10-CM | POA: Diagnosis not present

## 2017-10-10 DIAGNOSIS — Z0000 Encounter for general adult medical examination without abnormal findings: Secondary | ICD-10-CM

## 2017-10-10 LAB — COMPREHENSIVE METABOLIC PANEL
ALK PHOS: 80 U/L (ref 39–117)
ALT: 20 U/L (ref 0–35)
AST: 17 U/L (ref 0–37)
Albumin: 4 g/dL (ref 3.5–5.2)
BILIRUBIN TOTAL: 0.2 mg/dL (ref 0.2–1.2)
BUN: 18 mg/dL (ref 6–23)
CALCIUM: 10.3 mg/dL (ref 8.4–10.5)
CO2: 27 meq/L (ref 19–32)
Chloride: 105 mEq/L (ref 96–112)
Creatinine, Ser: 1.07 mg/dL (ref 0.40–1.20)
GFR: 53.85 mL/min — AB (ref >60.00)
Glucose, Bld: 97 mg/dL (ref 70–99)
Potassium: 4.6 mEq/L (ref 3.5–5.1)
Sodium: 139 mEq/L (ref 135–145)
Total Protein: 6.8 g/dL (ref 6.0–8.3)

## 2017-10-10 LAB — LIPID PANEL
CHOL/HDL RATIO: 3
Cholesterol: 188 mg/dL (ref 0–200)
HDL: 54.7 mg/dL (ref >39.00)
NONHDL: 133.06
Triglycerides: 338 mg/dL — ABNORMAL HIGH (ref 0.0–149.0)
VLDL: 67.6 mg/dL — AB (ref 0.0–40.0)

## 2017-10-10 LAB — CBC WITH DIFFERENTIAL/PLATELET
BASOS ABS: 0.1 10*3/uL (ref 0.0–0.1)
Basophils Relative: 0.8 % (ref 0.0–3.0)
Eosinophils Absolute: 0.1 10*3/uL (ref 0.0–0.7)
Eosinophils Relative: 1.5 % (ref 0.0–5.0)
HCT: 41.4 % (ref 36.0–46.0)
Hemoglobin: 14 g/dL (ref 12.0–15.0)
LYMPHS ABS: 2.6 10*3/uL (ref 0.7–4.0)
Lymphocytes Relative: 38.7 % (ref 12.0–46.0)
MCHC: 33.7 g/dL (ref 30.0–36.0)
MCV: 90.4 fl (ref 78.0–100.0)
MONO ABS: 0.7 10*3/uL (ref 0.1–1.0)
Monocytes Relative: 11 % (ref 3.0–12.0)
NEUTROS PCT: 48 % (ref 43.0–77.0)
Neutro Abs: 3.2 10*3/uL (ref 1.4–7.7)
Platelets: 441 10*3/uL — ABNORMAL HIGH (ref 150.0–400.0)
RBC: 4.59 Mil/uL (ref 3.87–5.11)
RDW: 13.6 % (ref 11.5–15.5)
WBC: 6.7 10*3/uL (ref 4.0–10.5)

## 2017-10-10 LAB — HEMOGLOBIN A1C: Hgb A1c MFr Bld: 5.6 % (ref 4.6–6.5)

## 2017-10-10 LAB — LDL CHOLESTEROL, DIRECT: LDL DIRECT: 98 mg/dL

## 2017-10-10 LAB — TSH: TSH: 1.32 u[IU]/mL (ref 0.35–4.50)

## 2017-10-10 NOTE — Progress Notes (Signed)
Subjective:   Joann Buchanan is a 72 y.o. female who presents for Medicare Annual (Subsequent) preventive examination.  Review of Systems:  N/A Cardiac Risk Factors include: advanced age (>83men, >71 women);hypertension     Objective:     Vitals: BP 110/84 (BP Location: Right Arm, Patient Position: Sitting, Cuff Size: Normal)   Pulse 65   Temp 97.7 F (36.5 C) (Oral)   Ht 4' 11.5" (1.511 m) Comment: no shoes  Wt 138 lb 12 oz (62.9 kg)   SpO2 96%   BMI 27.56 kg/m   Body mass index is 27.56 kg/m.  Advanced Directives 10/10/2017 08/13/2017 08/12/2017 10/09/2016 06/06/2016 06/05/2016 12/02/2015  Does Patient Have a Medical Advance Directive? Yes No No No No No No  Type of Advance Directive Living will - - - - - -  Would patient like information on creating a medical advance directive? - No - Patient declined - - No - Patient declined - No - patient declined information    Tobacco Social History   Tobacco Use  Smoking Status Never Smoker  Smokeless Tobacco Never Used     Counseling given: No   Clinical Intake:  Pre-visit preparation completed: Yes  Pain : No/denies pain Pain Score: 0-No pain     Nutritional Status: BMI 25 -29 Overweight Nutritional Risks: None Diabetes: No  How often do you need to have someone help you when you read instructions, pamphlets, or other written materials from your doctor or pharmacy?: 1 - Never What is the last grade level you completed in school?: 12th grade  Interpreter Needed?: No  Comments: pt lives with spouse Information entered by :: LPinson, LPN  Past Medical History:  Diagnosis Date  . Allergic rhinitis   . GERD (gastroesophageal reflux disease)   . Goiter    ?   Marland Kitchen HTN (hypertension)   . Hx of fibrocystic disease of breast   . Melanoma ([place])   . Migraine   . Numbness and tingling    bilateral hands  . Osteoarthritis of hand   . PONV (postoperative nausea and vomiting)    Past Surgical History:  Procedure  Laterality Date  . ANTERIOR CERVICAL DECOMP/DISCECTOMY FUSION N/A 12/02/2015   Procedure: ANTERIOR CERVICAL DECOMPRESSION/DISCECTOMY FUSION CERVICAL FOUR-FIVE;  Surgeon: Eustace Moore, MD;  Location: [facility];  Service: Neurosurgery;  Laterality: N/A;  . APPENDECTOMY    . BREAST BIOPSY     normal  . CATARACT EXTRACTION Right Jan 2017  . CATARACT EXTRACTION W/ INTRAOCULAR LENS IMPLANT Left 2018  . DEXA  7/07   negative  . EMG     left upper arm- neg  . ESOPHAGOGASTRODUODENOSCOPY     normal-24 hour PH probe (-)  . EYE SURGERY  10/09   Left eye; for double vision  . PARTIAL HYSTERECTOMY     Ovaries intact  . TUBAL LIGATION     Family History  Problem Relation Age of Onset  . Breast cancer Mother        Died, 71  . Other Father        Died, 90 - tree fell on him  . Breast cancer Sister        Died, 34  . Heart disease Sister        Died, 44  . Healthy Son   . Healthy Daughter    Social History   Socioeconomic History  . Marital status: Married    Spouse name: Not on file  . Number of  children: 2  . Years of education: Not on file  . Highest education level: Not on file  Occupational History  . Occupation: Retired    Fish farm manager: UNEMPLOYED  Social Needs  . Financial resource strain: Not on file  . Food insecurity:    Worry: Not on file    Inability: Not on file  . Transportation needs:    Medical: Not on file    Non-medical: Not on file  Tobacco Use  . Smoking status: Never Smoker  . Smokeless tobacco: Never Used  Substance and Sexual Activity  . Alcohol use: No    Alcohol/week: 0.0 oz  . Drug use: No  . Sexual activity: Never  Lifestyle  . Physical activity:    Days per week: Not on file    Minutes per session: Not on file  . Stress: Not on file  Relationships  . Social connections:    Talks on phone: Not on file    Gets together: Not on file    Attends religious service: Not on file    Active member of club or organization: Not on file    Attends  meetings of clubs or organizations: Not on file    Relationship status: Not on file  Other Topics Concern  . Not on file  Social History Narrative   Lives with husband.  Retired Water quality scientist for [employer]   Married; 2 children; 3 grand children       Outpatient Encounter Medications as of 10/10/2017  Medication Sig  . ALPRAZolam (XANAX) 0.25 MG tablet Take 1 tablet (0.25 mg total) by mouth as needed. Prior to flights  . aspirin EC 81 MG tablet Take 1 tablet (81 mg total) by mouth daily.  . Aspirin-Salicylamide-Caffeine (BC HEADACHE) 325-95-16 MG TABS Take 1 packet by mouth daily as needed (for headache).  Marland Kitchen ibuprofen (ADVIL,MOTRIN) 200 MG tablet 200 mg.  . omeprazole (PRILOSEC) 40 MG capsule Take 1 capsule (40 mg total) by mouth daily as needed. (Patient taking differently: Take 40 mg by mouth daily. )  . Polyethyl Glycol-Propyl Glycol 0.4-0.3 % SOLN Place 1 drop into both eyes as needed.  . SUMAtriptan (IMITREX) 20 MG/ACT nasal spray USE 1 SPRAY INTO THE NOSE EVERY 2 HOURS AS NEEDED FOR MIGRAINE. NO MORE THAN 2 DOSES IN 24 HOURS   No facility-administered encounter medications on file as of 10/10/2017.     Activities of Daily Living In your present state of health, do you have any difficulty performing the following activities: 10/10/2017 08/13/2017  Hearing? N N  Vision? N N  Difficulty concentrating or making decisions? N N  Walking or climbing stairs? Y N  Dressing or bathing? N N  Doing errands, shopping? N N  Preparing Food and eating ? N -  Using the Toilet? N -  In the past six months, have you accidently leaked urine? N -  Do you have problems with loss of bowel control? N -  Managing your Medications? N -  Managing your Finances? N -  Housekeeping or managing your Housekeeping? N -  Some recent data might be hidden    Patient Care Team: Tower, Wynelle Fanny, MD as PCP - General Marilynne Halsted, MD as Consulting Physician (Ophthalmology) Minna Merritts, MD as  Consulting Physician (Cardiology) Eustace Moore, MD as Consulting Physician (Neurosurgery) Alda Berthold, DO as Consulting Physician (Neurology)    Assessment:   This is a routine wellness examination for [name].   Hearing Screening  125Hz  250Hz  500Hz  1000Hz  2000Hz  3000Hz  4000Hz  6000Hz  8000Hz   Right ear:   40 40 40  40    Left ear:   40 40 40  40    Vision Screening Comments: Jan 2019 with Dr. Susa Simmonds  Exercise Activities and Dietary recommendations Current Exercise Habits: Home exercise routine, Type of exercise: walking, Time (Minutes): 60, Frequency (Times/Week): 2, Weekly Exercise (Minutes/Week): 120, Intensity: Mild, Exercise limited by: None identified  Goals    . Increase physical activity     When weather permits, I will continue walking for 3 miles daily 2 days per week.        Fall Risk Fall Risk  10/10/2017 10/09/2016 06/28/2016 12/28/2015 10/10/2015  Falls in the past year? No No No No No   Depression Screen PHQ 2/9 Scores 10/10/2017 10/09/2016 10/05/2015 10/05/2015  PHQ - 2 Score 1 0 0 0  PHQ- 9 Score 1 - - -     Cognitive Function MMSE - Mini Mental State Exam 10/10/2017 10/09/2016 10/05/2015  Orientation to time 5 5 5   Orientation to Place 5 5 5   Registration 3 3 3   Attention/ Calculation 0 0 0  Recall 3 3 3   Language- name 2 objects 0 0 0  Language- repeat 1 1 1   Language- follow 3 step command 3 3 3   Language- read & follow direction 0 0 0  Write a sentence 0 0 0  Copy design 0 0 0  Total score 20 20 20      PLEASE NOTE: A Mini-Cog screen was completed. Maximum score is 20. A value of 0 denotes this part of Folstein MMSE was not completed or the patient failed this part of the Mini-Cog screening.   Mini-Cog Screening Orientation to Time - Max 5 pts Orientation to Place - Max 5 pts Registration - Max 3 pts Recall - Max 3 pts Language Repeat - Max 1 pts Language Follow 3 Step Command - Max 3 pts     Immunization History  Administered Date(s) Administered   . Td 05/08/2004  . Zoster 03/23/2009    Screening Tests Health Maintenance  Topic Date Due  . Fecal DNA (Cologuard)  10/10/2019 (Originally 07/27/1997)  . PNA vac Low Risk Adult (1 of 2 - PCV13) 09/07/2023 (Originally 07/27/2012)  . TETANUS/TDAP  05/07/2024 (Originally 05/08/2014)  . INFLUENZA VACCINE  02/06/2018  . MAMMOGRAM  04/16/2018  . DEXA SCAN  Completed  . Hepatitis C Screening  Completed      Plan:     I have personally reviewed, addressed, and noted the following in the patient's chart:  A. Medical and social history B. Use of alcohol, tobacco or illicit drugs  C. Current medications and supplements D. Functional ability and status E.  Nutritional status F.  Physical activity G. Advance directives H. List of other physicians I.  Hospitalizations, surgeries, and ER visits in previous 12 months J.  [place] to include hearing, vision, cognitive, depression L. Referrals and appointments - none  In addition, I have reviewed and discussed with patient certain preventive protocols, quality metrics, and best practice recommendations. A written personalized care plan for preventive services as well as general preventive health recommendations were provided to patient.  See attached scanned questionnaire for additional information.   Signed,   Lindell Noe, MHA, BS, LPN Health Coach

## 2017-10-10 NOTE — Patient Instructions (Signed)
Joann Buchanan , Thank you for taking time to come for your Medicare Wellness Visit. I appreciate your ongoing commitment to your health goals. Please review the following plan we discussed and let me know if I can assist you in the future.   These are the goals we discussed: Goals    . Increase physical activity     When weather permits, I will continue walking for 3 miles daily 2 days per week.        This is a list of the screening recommended for you and due dates:  Health Maintenance  Topic Date Due  . Cologuard (Stool DNA test)  10/10/2019*  . Pneumonia vaccines (1 of 2 - PCV13) 09/07/2023*  . Tetanus Vaccine  05/07/2024*  . Flu Shot  02/06/2018  . Mammogram  04/16/2018  . DEXA scan (bone density measurement)  Completed  .  Hepatitis C: One time screening is recommended by Center for Disease Control  (CDC) for  adults born from [birth year] through 1963.   Completed  *Topic was postponed. The date shown is not the original due date.   Preventive Care for Adults  A healthy lifestyle and preventive care can promote health and wellness. Preventive health guidelines for adults include the following key practices.  . A routine yearly physical is a good way to check with your health care provider about your health and preventive screening. It is a chance to share any concerns and updates on your health and to receive a thorough exam.  . Visit your dentist for a routine exam and preventive care every 6 months. Brush your teeth twice a day and floss once a day. Good oral hygiene prevents tooth decay and gum disease.  . The frequency of eye exams is based on your age, health, family medical history, use  of contact lenses, and other factors. Follow your health care provider's recommendations for frequency of eye exams.  . Eat a healthy diet. Foods like vegetables, fruits, whole grains, low-fat dairy products, and lean protein foods contain the nutrients you need without too many calories. Decrease  your intake of foods high in solid fats, added sugars, and salt. Eat the right amount of calories for you. Get information about a proper diet from your health care provider, if necessary.  . Regular physical exercise is one of the most important things you can do for your health. Most adults should get at least 150 minutes of moderate-intensity exercise (any activity that increases your heart rate and causes you to sweat) each week. In addition, most adults need muscle-strengthening exercises on 2 or more days a week.  Silver Sneakers may be a benefit available to you. To determine eligibility, you may visit the website: www.silversneakers.com or contact program at 765-596-5267 Mon-Fri between 8AM-8PM.   . Maintain a healthy weight. The body mass index (BMI) is a screening tool to identify possible weight problems. It provides an estimate of body fat based on height and weight. Your health care provider can find your BMI and can help you achieve or maintain a healthy weight.   For adults 20 years and older: ? A BMI below 18.5 is considered underweight. ? A BMI of 18.5 to 24.9 is normal. ? A BMI of 25 to 29.9 is considered overweight. ? A BMI of 30 and above is considered obese.   . Maintain normal blood lipids and cholesterol levels by exercising and minimizing your intake of saturated fat. Eat a balanced diet with plenty of fruit  and vegetables. Blood tests for lipids and cholesterol should begin at age 33 and be repeated every 5 years. If your lipid or cholesterol levels are high, you are over 50, or you are at high risk for heart disease, you may need your cholesterol levels checked more frequently. Ongoing high lipid and cholesterol levels should be treated with medicines if diet and exercise are not working.  . If you smoke, find out from your health care provider how to quit. If you do not use tobacco, please do not start.  . If you choose to drink alcohol, please do not consume more than  2 drinks per day. One drink is considered to be 12 ounces (355 mL) of beer, 5 ounces (148 mL) of wine, or 1.5 ounces (44 mL) of liquor.  . If you are 36-19 years old, ask your health care provider if you should take aspirin to prevent strokes.  . Use sunscreen. Apply sunscreen liberally and repeatedly throughout the day. You should seek shade when your shadow is shorter than you. Protect yourself by wearing long sleeves, pants, a wide-brimmed hat, and sunglasses year round, whenever you are outdoors.  . Once a month, do a whole body skin exam, using a mirror to look at the skin on your back. Tell your health care provider of new moles, moles that have irregular borders, moles that are larger than a pencil eraser, or moles that have changed in shape or color.

## 2017-10-10 NOTE — Progress Notes (Signed)
PCP notes:   Health maintenance:  Fecal Cologuard - per portal, there are no results for patient. Patient states she sent sample back to lab.  Abnormal screenings:   Depression score: 1 Depression screen [facility] 2/9 10/10/2017 10/09/2016 10/05/2015 10/05/2015 03/03/2013  Decreased Interest 1 0 0 0 0  Down, Depressed, Hopeless 0 0 0 0 0  PHQ - 2 Score 1 0 0 0 0  Altered sleeping 0 - - - -  Tired, decreased energy 0 - - - -  Change in appetite 0 - - - -  Feeling bad or failure about yourself  0 - - - -  Trouble concentrating 0 - - - -  Moving slowly or fidgety/restless 0 - - - -  Suicidal thoughts 0 - - - -  PHQ-9 Score 1 - - - -  Difficult doing work/chores Not difficult at all - - - -    Patient concerns:   Headaches - affecting sleep 2-3 nights per week; pain managed with BC Headache powder or Imitrex  Motivation - concerned she is not motivated "to get things done". States children are concerned about her.   Nurse concerns:  None  Next PCP appt:   10/16/17 @ 1130  I reviewed health advisor's note, was available for consultation, and agree with documentation and plan. Loura Pardon MD

## 2017-10-16 ENCOUNTER — Ambulatory Visit (INDEPENDENT_AMBULATORY_CARE_PROVIDER_SITE_OTHER): Payer: Medicare Other | Admitting: Family Medicine

## 2017-10-16 ENCOUNTER — Encounter: Payer: Self-pay | Admitting: Family Medicine

## 2017-10-16 VITALS — BP 130/80 | HR 87 | Temp 98.3°F | Ht 59.5 in | Wt 136.5 lb

## 2017-10-16 DIAGNOSIS — R739 Hyperglycemia, unspecified: Secondary | ICD-10-CM

## 2017-10-16 DIAGNOSIS — I10 Essential (primary) hypertension: Secondary | ICD-10-CM

## 2017-10-16 DIAGNOSIS — Z0000 Encounter for general adult medical examination without abnormal findings: Secondary | ICD-10-CM

## 2017-10-16 DIAGNOSIS — G43709 Chronic migraine without aura, not intractable, without status migrainosus: Secondary | ICD-10-CM

## 2017-10-16 DIAGNOSIS — Z85820 Personal history of malignant melanoma of skin: Secondary | ICD-10-CM

## 2017-10-16 DIAGNOSIS — E2839 Other primary ovarian failure: Secondary | ICD-10-CM | POA: Diagnosis not present

## 2017-10-16 DIAGNOSIS — Z23 Encounter for immunization: Secondary | ICD-10-CM

## 2017-10-16 DIAGNOSIS — M8580 Other specified disorders of bone density and structure, unspecified site: Secondary | ICD-10-CM

## 2017-10-16 MED ORDER — OMEPRAZOLE 40 MG PO CPDR: 40.0000 mg | DELAYED_RELEASE_CAPSULE | Freq: Every day | ORAL | 11 refills | Status: DC

## 2017-10-16 MED ORDER — KETOROLAC TROMETHAMINE 30 MG/ML IJ SOLN
30.0000 mg | Freq: Once | INTRAMUSCULAR | Status: AC
  Administered 2017-10-16: 30 mg via INTRAMUSCULAR

## 2017-10-16 MED ORDER — METHOCARBAMOL 500 MG PO TABS: 500.0000 mg | ORAL_TABLET | Freq: Three times a day (TID) | ORAL | 0 refills | Status: DC | PRN

## 2017-10-16 MED ORDER — KETOROLAC TROMETHAMINE 30 MG/ML IJ SOLN: 30.0000 mg | Freq: Once | INTRAMUSCULAR | Status: DC

## 2017-10-16 NOTE — Assessment & Plan Note (Signed)
Acute today-toradol 30 mg given  Suspect pollen is a trigger  Enc pt to call and f/u with neurology since these have become much more frequent Disc caffeine avoidance and inc water intake  Trial of robaxin for rescue  Cut back on analgesics (BC) due to rebound Update if not starting to improve in a week or if worsening

## 2017-10-16 NOTE — Assessment & Plan Note (Signed)
Ref for 2 y dexa  Osteopenia Disc need for calcium/ vitamin D/ wt bearing exercise and bone density test every 2 y to monitor Disc safety/ fracture risk in detail   No falls or fx

## 2017-10-16 NOTE — Assessment & Plan Note (Signed)
.   Lab Results  Component Value Date   HGBA1C 5.6 10/10/2017   disc imp of low glycemic diet and wt loss to prevent DM2

## 2017-10-16 NOTE — Assessment & Plan Note (Signed)
bp in fair control at this time  BP Readings from Last 1 Encounters:  10/16/17 130/80   No changes needed Disc lifstyle change with low sodium diet and exercise  Labs reviewed

## 2017-10-16 NOTE — Patient Instructions (Addendum)
Call Dr Serita Grit office to let them know your headaches are more frequent  The BCs may be causing rebound headache  Increase fluids to 64 oz per day- mostly water   If you are interested in the new shingles vaccine (Shingrix) - call your local pharmacy to check on coverage and availability  Your pharmacy can put you on a wait list   prevnar vaccine (pneumonia vaccine) today   toradol shot for migraine today

## 2017-10-16 NOTE — Assessment & Plan Note (Signed)
Reviewed health habits including diet and exercise and skin cancer prevention Reviewed appropriate screening tests for age  Also reviewed health mt list, fam hx and immunization status , as well as social and family history   See HPI Labs reviewed  prevnar vaccine today

## 2017-10-16 NOTE — Progress Notes (Signed)
Subjective:    Patient ID: Joann Buchanan, female    DOB: 1946/06/27, 72 y.o.   MRN: [id number]  HPI Here for health maintenance exam and to review chronic medical problems    More headaches lately  Wakes up in the night or am with a headache ? If worse during pollen season  Hx of migraines -they make her lie down  Used to be 1-2 times per mo (now for last several mo more frequent) Taking BCs  (? Rebound)   No caffeine in bev    Not a lot of allergy symptoms  occ antihistamine  No purulent nasal d/c or fever  Face feels full     Wt Readings from Last 3 Encounters:  10/16/17 136 lb 8 oz (61.9 kg)  10/10/17 138 lb 12 oz (62.9 kg)  08/14/17 134 lb 4.8 oz (60.9 kg)  down a bit  Eating healthy  Exercise-walking and going to the gym/ using machines also  27.11 kg/m   amw was 4/4 Voiced concern over headaches and lack of motivation   Sent in cologuard last year and did not get anything back   Katha Cabal said she would check on it) declined colon screening in the past  Declines vaccines (pneumonia or flu) -changed her mind about prevnar and will get it today  Had zostavax 9/10   Mammogram 10/18 neg  Breast cancer in mother and sister -(mother in 41s and sister in 53s)  Self breast exam --no lumps   dexa 4/17 osteopenia at FN slt dec in bmd  No falls or fx  On ca and D  Hx of melanoma- dermatology status   bp is up- has a headache today  BP Readings from Last 3 Encounters:  10/16/17 138/90  10/10/17 110/84  08/14/17 (!) 148/76   Pulse Readings from Last 3 Encounters:  10/16/17 87  10/10/17 65  08/14/17 73    Cholesterol  Lab Results  Component Value Date   CHOL 188 10/10/2017   CHOL 183 08/13/2017   CHOL 191 10/09/2016   Lab Results  Component Value Date   HDL 54.70 10/10/2017   HDL 51 08/13/2017   HDL 55.50 10/09/2016   Lab Results  Component Value Date   LDLCALC 100 (H) 08/13/2017   LDLCALC 105 (H) 10/09/2016   LDLCALC 76 06/06/2016   Lab  Results  Component Value Date   TRIG 338.0 (H) 10/10/2017   TRIG 160 (H) 08/13/2017   TRIG 152.0 (H) 10/09/2016   Lab Results  Component Value Date   CHOLHDL 3 10/10/2017   CHOLHDL 3.6 08/13/2017   CHOLHDL 3 10/09/2016   Lab Results  Component Value Date   LDLDIRECT 98.0 10/10/2017   LDLDIRECT 124.0 09/28/2015  she was not fasting - so trig were up    Hyperglycemia Lab Results  Component Value Date   HGBA1C 5.6 10/10/2017  this is improved    Hx of migraines  (neuro Dr Posey Pronto) Hx of cervical spine surg/fusion in 2017  [facility] headache imitrex ns  Lab Results  Component Value Date   CREATININE 1.07 10/10/2017   BUN 18 10/10/2017   NA 139 10/10/2017   K 4.6 10/10/2017   CL 105 10/10/2017   CO2 27 10/10/2017   Lab Results  Component Value Date   ALT 20 10/10/2017   AST 17 10/10/2017   ALKPHOS 80 10/10/2017   BILITOT 0.2 10/10/2017    Lab Results  Component Value Date   WBC 6.7 10/10/2017  HGB 14.0 10/10/2017   HCT 41.4 10/10/2017   MCV 90.4 10/10/2017   PLT 441.0 (H) 10/10/2017   will watch platelets  Lab Results  Component Value Date   TSH 1.32 10/10/2017    Patient Active Problem List   Diagnosis Date Noted  . Hyperglycemia 10/09/2017  . Chest pain 08/13/2017  . Encounter for screening mammogram for breast cancer 10/30/2016  . S/P cervical spinal fusion 12/02/2015  . Osteopenia 10/25/2015  . Estrogen deficiency 10/05/2015  . Routine general medical examination at a health care facility 09/25/2015  . Caregiver stress 04/06/2015  . Hand tingling 04/05/2015  . Encounter for Medicare annual wellness exam 03/03/2013  . OSTEOARTHRITIS, HANDS, BILATERAL 11/01/2009  . Menopausal symptoms 03/23/2009  . History of melanoma 02/04/2007  . THYROMEGALY 02/04/2007  . Migraine 02/04/2007  . Essential hypertension 02/04/2007  . ALLERGIC RHINITIS 02/04/2007  . GERD 02/04/2007   Past Medical History:  Diagnosis Date  . Allergic rhinitis   . GERD  (gastroesophageal reflux disease)   . Goiter    ?   Marland Kitchen HTN (hypertension)   . Hx of fibrocystic disease of breast   . Melanoma ([place])   . Migraine   . Numbness and tingling    bilateral hands  . Osteoarthritis of hand   . PONV (postoperative nausea and vomiting)    Past Surgical History:  Procedure Laterality Date  . ANTERIOR CERVICAL DECOMP/DISCECTOMY FUSION N/A 12/02/2015   Procedure: ANTERIOR CERVICAL DECOMPRESSION/DISCECTOMY FUSION CERVICAL FOUR-FIVE;  Surgeon: Eustace Moore, MD;  Location: [facility];  Service: Neurosurgery;  Laterality: N/A;  . APPENDECTOMY    . BREAST BIOPSY     normal  . CATARACT EXTRACTION Right Jan 2017  . CATARACT EXTRACTION W/ INTRAOCULAR LENS IMPLANT Left 2018  . DEXA  7/07   negative  . EMG     left upper arm- neg  . ESOPHAGOGASTRODUODENOSCOPY     normal-24 hour PH probe (-)  . EYE SURGERY  10/09   Left eye; for double vision  . PARTIAL HYSTERECTOMY     Ovaries intact  . TUBAL LIGATION     Social History   Tobacco Use  . Smoking status: Never Smoker  . Smokeless tobacco: Never Used  Substance Use Topics  . Alcohol use: No    Alcohol/week: 0.0 oz  . Drug use: No   Family History  Problem Relation Age of Onset  . Breast cancer Mother        Died, 69  . Other Father        Died, 79 - tree fell on him  . Breast cancer Sister        Died, 54  . Heart disease Sister        Died, 39  . Healthy Son   . Healthy Daughter    Allergies  Allergen Reactions  . Sulfonamide Derivatives Anaphylaxis and Other (See Comments)    REACTION: unspecified Hives and swelling   Current Outpatient Medications on File Prior to Visit  Medication Sig Dispense Refill  . ALPRAZolam (XANAX) 0.25 MG tablet Take 1 tablet (0.25 mg total) by mouth as needed. Prior to flights 30 tablet 0  . aspirin EC 81 MG tablet Take 1 tablet (81 mg total) by mouth daily. 30 tablet 0  . Aspirin-Salicylamide-Caffeine (BC HEADACHE) 325-95-16 MG TABS Take 1 packet by mouth  daily as needed (for headache).    Marland Kitchen ibuprofen (ADVIL,MOTRIN) 200 MG tablet 200 mg.    . Polyethyl Glycol-Propyl Glycol  0.4-0.3 % SOLN Place 1 drop into both eyes as needed.    . SUMAtriptan (IMITREX) 20 MG/ACT nasal spray USE 1 SPRAY INTO THE NOSE EVERY 2 HOURS AS NEEDED FOR MIGRAINE. NO MORE THAN 2 DOSES IN 24 HOURS 1 Inhaler 11   No current facility-administered medications on file prior to visit.     Review of Systems  Constitutional: Positive for fatigue. Negative for activity change, appetite change, fever and unexpected weight change.  HENT: Negative for congestion, ear pain, rhinorrhea, sinus pressure and sore throat.   Eyes: Negative for pain, redness and visual disturbance.  Respiratory: Negative for cough, shortness of breath and wheezing.   Cardiovascular: Negative for chest pain and palpitations.  Gastrointestinal: Negative for abdominal pain, blood in stool, constipation and diarrhea.  Endocrine: Negative for polydipsia and polyuria.  Genitourinary: Negative for dysuria, frequency and urgency.  Musculoskeletal: Negative for arthralgias, back pain and myalgias.  Skin: Negative for pallor and rash.  Allergic/Immunologic: Negative for environmental allergies.  Neurological: Positive for headaches. Negative for dizziness, syncope and light-headedness.  Hematological: Negative for adenopathy. Does not bruise/bleed easily.  Psychiatric/Behavioral: Positive for decreased concentration. Negative for dysphoric mood. The patient is not nervous/anxious.        Stressors        Objective:   Physical Exam  Constitutional: She appears well-developed and well-nourished. No distress.  Well appearing   HENT:  Head: Normocephalic and atraumatic.  Right Ear: External ear normal.  Left Ear: External ear normal.  Mouth/Throat: Oropharynx is clear and moist.  Boggy nares No sinus tenderness No temporal tenderness   Eyes: Pupils are equal, round, and reactive to light. Conjunctivae and  EOM are normal. No scleral icterus.  Neck: Normal range of motion. Neck supple. No JVD present. Carotid bruit is not present. Thyromegaly present.  Cardiovascular: Normal rate, regular rhythm, normal heart sounds and intact distal pulses. Exam reveals no gallop.  Pulmonary/Chest: Effort normal and breath sounds normal. No respiratory distress. She has no wheezes. She exhibits no tenderness. No breast swelling, tenderness, discharge or bleeding.  Abdominal: Soft. Bowel sounds are normal. She exhibits no distension, no abdominal bruit and no mass. There is no tenderness.  Genitourinary: No breast swelling, tenderness, discharge or bleeding.  Genitourinary Comments: Breast exam: No mass, nodules, thickening, tenderness, bulging, retraction, inflamation, nipple discharge or skin changes noted.  No axillary or clavicular LA.      Musculoskeletal: Normal range of motion. She exhibits no edema or tenderness.  Lymphadenopathy:    She has no cervical adenopathy.  Neurological: She is alert. She has normal reflexes. She displays no atrophy. No cranial nerve deficit or sensory deficit. She exhibits normal muscle tone. Coordination and gait normal.  No focal neuro abn   Skin: Skin is warm and dry. No rash noted. No erythema. No pallor.  Solar lentigines diffusely   Psychiatric: She has a normal mood and affect.          Assessment & Plan:   Problem List Items Addressed This Visit      Cardiovascular and Mediastinum   Essential hypertension    bp in fair control at this time  BP Readings from Last 1 Encounters:  10/16/17 130/80   No changes needed Disc lifstyle change with low sodium diet and exercise  Labs reviewed       Migraine    Acute today-toradol 30 mg given  Suspect pollen is a trigger  Enc pt to call and f/u with neurology since these have  become much more frequent Disc caffeine avoidance and inc water intake  Trial of robaxin for rescue  Cut back on analgesics (BC) due to  rebound Update if not starting to improve in a week or if worsening        Relevant Medications   methocarbamol (ROBAXIN) 500 MG tablet   ketorolac (TORADOL) 30 MG/ML injection 30 mg (Completed)     Musculoskeletal and Integument   Osteopenia    Ref for 2 y dexa  Osteopenia Disc need for calcium/ vitamin D/ wt bearing exercise and bone density test every 2 y to monitor Disc safety/ fracture risk in detail   No falls or fx         Other   Estrogen deficiency   Relevant Orders   DG Bone Density   History of melanoma    6-12 mo visits with dermatologist Nothing abn lately  Continues sun protection       Hyperglycemia    . Lab Results  Component Value Date   HGBA1C 5.6 10/10/2017   disc imp of low glycemic diet and wt loss to prevent DM2       Routine general medical examination at a health care facility - Primary    Reviewed health habits including diet and exercise and skin cancer prevention Reviewed appropriate screening tests for age  Also reviewed health mt list, fam hx and immunization status , as well as social and family history   See HPI Labs reviewed  prevnar vaccine today         Other Visit Diagnoses    Need for vaccination with 13-polyvalent pneumococcal conjugate vaccine       Relevant Orders   Pneumococcal conjugate vaccine 13-valent (Completed)

## 2017-10-16 NOTE — Assessment & Plan Note (Signed)
6-12 mo visits with dermatologist Nothing abn lately  Continues sun protection

## 2017-12-04 ENCOUNTER — Encounter: Payer: Self-pay | Admitting: *Deleted

## 2018-05-08 ENCOUNTER — Telehealth: Payer: Self-pay

## 2018-05-08 DIAGNOSIS — R7303 Prediabetes: Secondary | ICD-10-CM

## 2018-05-08 NOTE — Telephone Encounter (Signed)
Diabetic care-nephropathy and diabetic eye exam needs to be addressed for quality metrics even though last office note said hyperglycemia per [facility] gaps. Thank you.

## 2018-05-09 DIAGNOSIS — R7303 Prediabetes: Secondary | ICD-10-CM | POA: Insufficient documentation

## 2018-05-09 NOTE — Telephone Encounter (Signed)
I resolved the hyperglycemia and changed the diagnosis to pre diabetes  The eye exam and urine testing are not necessary  She is not diabetic  Thanks

## 2018-05-12 NOTE — Telephone Encounter (Signed)
noted 

## 2018-10-22 ENCOUNTER — Ambulatory Visit: Payer: Medicare Other

## 2018-10-24 ENCOUNTER — Encounter: Payer: Medicare Other | Admitting: Family Medicine

## 2019-02-17 IMAGING — CT CT HEART MORP W/ CTA COR W/ SCORE W/ CA W/CM &/OR W/O CM
4 of 7 series · 8 of 20 positions shown, 9 images · IV contrast (APPLIED)
Comparison: None.

EXAM:
OVER-READ INTERPRETATION  CT CHEST

The following report is an over-read performed by radiologist Dr.
over-read does not include interpretation of cardiac or coronary
anatomy or pathology. The cardiac calcium score and coronary CTA
interpretation by the cardiologist is attached.
TECHNIQUE: The patient was scanned on a Phillips Force scanner.

[Series 6: best diast 72 % · axial · 0.39mm/px · z∈[-370,-323]mm · 2 of 353 slices shown, 3 images]
[im 118/353  vessel]
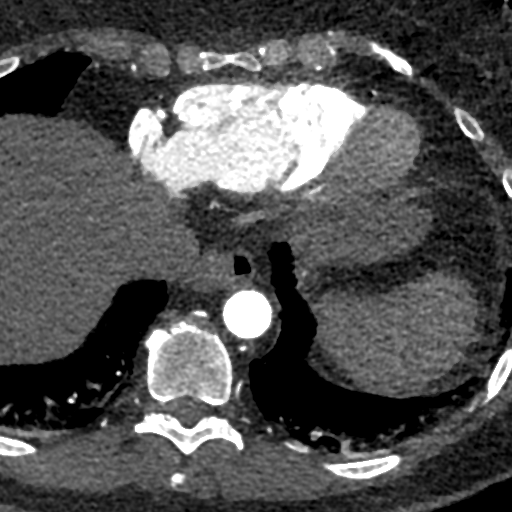
[im 118/353  lung]
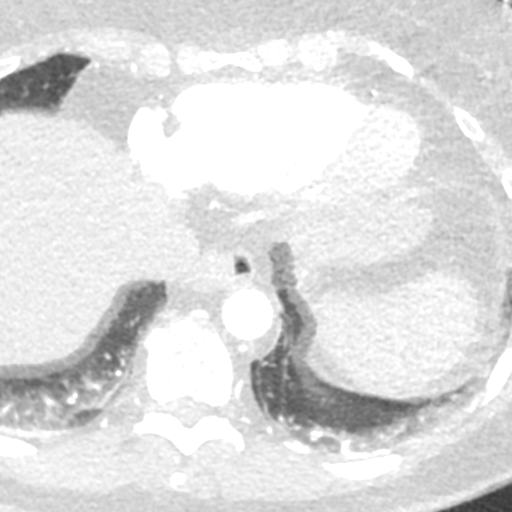
[im 235/353  vessel]
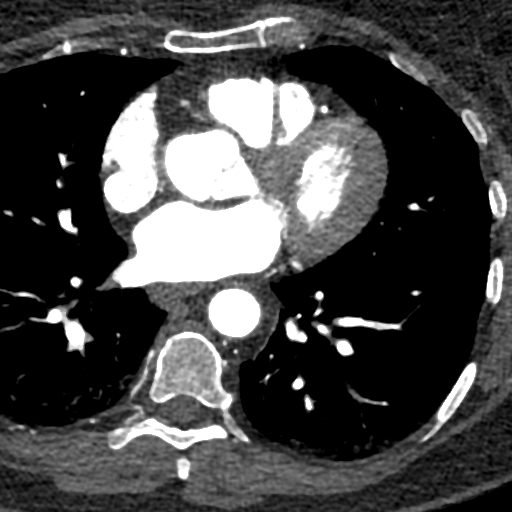

[Series 7: best syst 43 % · axial · 0.39mm/px · z∈[-370,-323]mm · 2 of 353 slices shown]
[im 118/353  vessel]
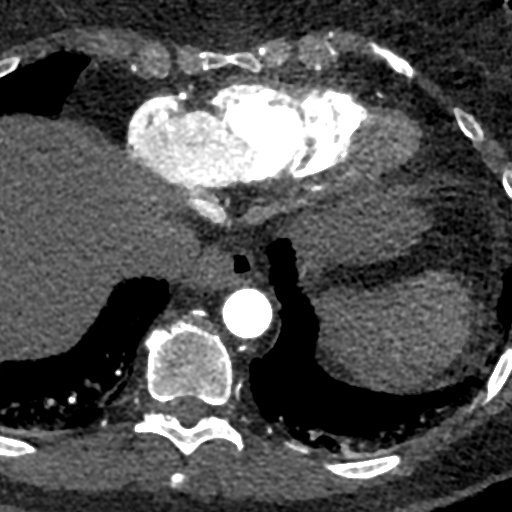
[im 235/353  vessel]
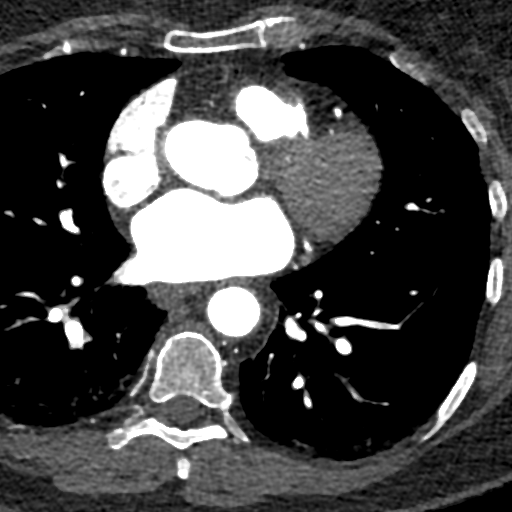

[Series 8: ts diast sharp 72 % · axial · 0.39mm/px · z∈[-370,-323]mm · 2 of 353 slices shown]
[im 118/353  lung]
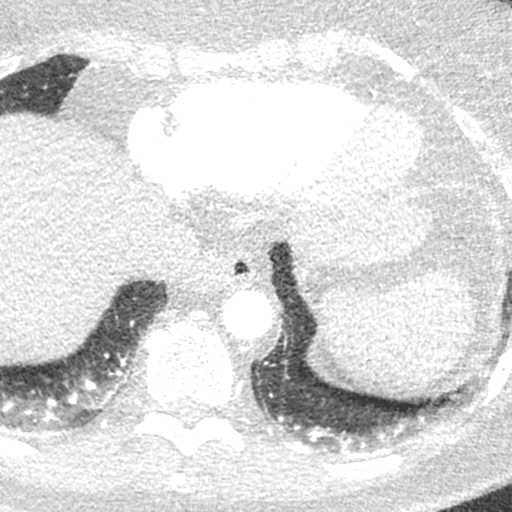
[im 235/353  lung]
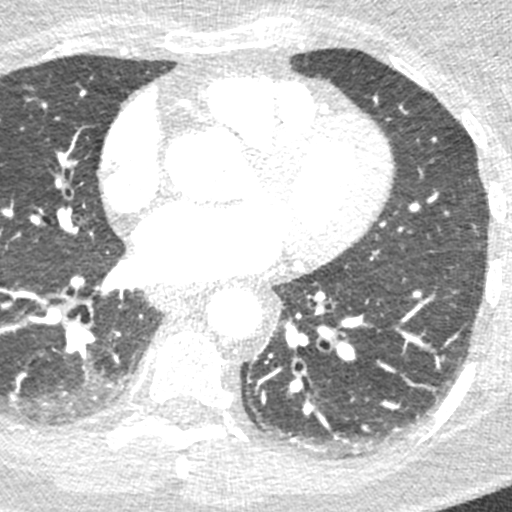

[Series 9: ts syst sharp 43 % · axial · 0.39mm/px · z∈[-370,-323]mm · 2 of 353 slices shown]
[im 118/353  lung]
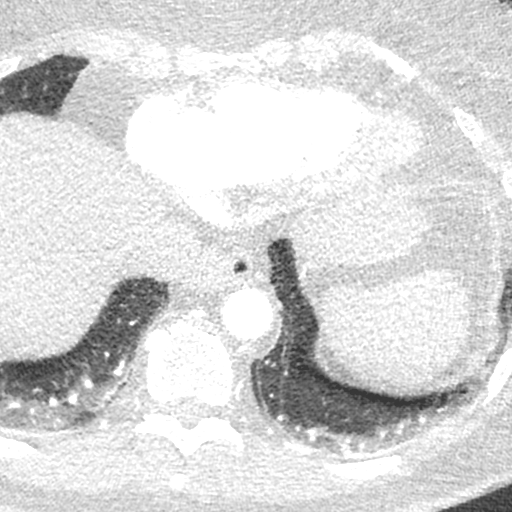
[im 235/353  lung]
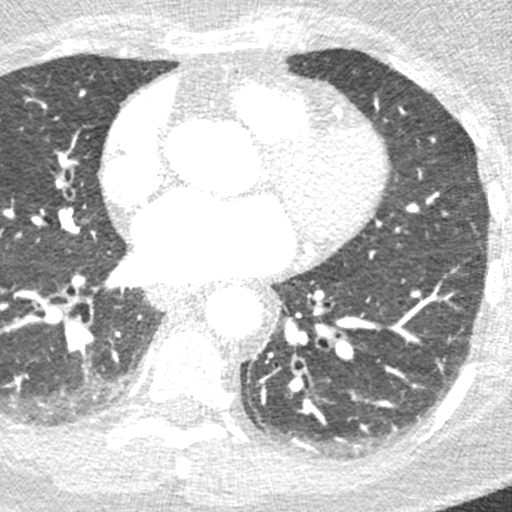

[8 of 20 positions shown; findings below may reference images not displayed]

FINDINGS: Multiple tiny pulmonary nodules are noted in the visualized portions
of the lungs, the largest of which is in the left lower lobe (axial
image 18 of series 12), measuring only 5 mm. Within the visualized
portions of the thorax there is no acute consolidative airspace
disease, no pleural effusions, no pneumothorax and no
lymphadenopathy. Visualized portions of the upper abdomen are
unremarkable. There are no aggressive appearing lytic or blastic
lesions noted in the visualized portions of the skeleton.
IMPRESSION: 1. Multiple small pulmonary nodules measuring 5 mm or less in size.
These are nonspecific, but are statistically likely benign. No
follow-up needed if patient is low-risk (and has no known or
suspected primary neoplasm). Non-contrast chest CT can be considered
in 12 months if patient is high-risk. This recommendation follows
the consensus statement: Guidelines for Management of Incidental
Pulmonary Nodules Detected on CT Images: From the [REDACTED]AL DATA:  72 -year-old female with chest pain and borderline
troponin elevation.

EXAM:
Cardiac/Coronary  CT
FINDINGS: A 120 kV prospective scan was triggered in the descending thoracic
aorta at 111 HU's. Axial non-contrast 3 mm slices were carried out
through the heart. The data set was analyzed on a dedicated work
station and scored using the Agatson method. Gantry rotation speed
was 250 msecs and collimation was .6 mm. 20 mg of iv Metoprolol, 5
mg of iv Cardizem and 0.8 mg of sl NTG was given. The 3D data set
was reconstructed in 5% intervals of the 67-82 % of the R-R cycle.
Diastolic phases were analyzed on a dedicated work station using
MPR, MIP and VRT modes. The patient received 80 cc of contrast.

Aorta:  Normal size.  No calcifications.  No dissection.

Aortic Valve:  Trileaflet.  No calcifications.

Coronary Arteries:  Normal coronary origin.  Right dominance.

RCA is a large dominant artery that gives rise to PDA and PLVB.
There is no plaque.

Left main is a large artery that gives rise to LAD and LCX arteries.

LAD is a large vessel that gives rise to one diagonal artery. There
is minimal noncalcified plaque in the mid LAD, D1 has no plaque.

LCX is a non-dominant artery that gives rise to one large OM1
branch. There is no plaque.

Other findings:

Normal pulmonary vein drainage into the left atrium.

Normal let atrial appendage without a thrombus.

Normal size of the pulmonary artery.
IMPRESSION: 1. Coronary calcium score of 0. This was 0 percentile for age and
sex matched control.

2. Normal coronary origin with right dominance.

3.  Minimal plaque in the mid LAD, otherwise normal coronaries.

## 2019-03-23 ENCOUNTER — Telehealth: Payer: Self-pay | Admitting: Family Medicine

## 2019-03-23 DIAGNOSIS — R7303 Prediabetes: Secondary | ICD-10-CM

## 2019-03-23 DIAGNOSIS — I10 Essential (primary) hypertension: Secondary | ICD-10-CM

## 2019-03-23 NOTE — Telephone Encounter (Signed)
-----   Message from Cloyd Stagers, RT sent at 03/20/2019 10:46 AM EDT ----- Regarding: Lab Orders for Tuesday 9.15.2020 Please place lab orders for Tuesday 9.15.2020, office visit for physical on Tuesday 9.22.2020 Thank you, Dyke Maes RT(R)

## 2019-03-24 ENCOUNTER — Other Ambulatory Visit (INDEPENDENT_AMBULATORY_CARE_PROVIDER_SITE_OTHER): Payer: Medicare Other

## 2019-03-24 ENCOUNTER — Ambulatory Visit (INDEPENDENT_AMBULATORY_CARE_PROVIDER_SITE_OTHER): Payer: Medicare Other

## 2019-03-24 ENCOUNTER — Ambulatory Visit: Payer: Medicare Other

## 2019-03-24 DIAGNOSIS — R7303 Prediabetes: Secondary | ICD-10-CM | POA: Diagnosis not present

## 2019-03-24 DIAGNOSIS — Z0000 Encounter for general adult medical examination without abnormal findings: Secondary | ICD-10-CM

## 2019-03-24 DIAGNOSIS — I10 Essential (primary) hypertension: Secondary | ICD-10-CM | POA: Diagnosis not present

## 2019-03-24 LAB — CBC WITH DIFFERENTIAL/PLATELET
Basophils Absolute: 0 10*3/uL (ref 0.0–0.1)
Basophils Relative: 0.5 % (ref 0.0–3.0)
Eosinophils Absolute: 0.1 10*3/uL (ref 0.0–0.7)
Eosinophils Relative: 2.6 % (ref 0.0–5.0)
HCT: 39.6 % (ref 36.0–46.0)
Hemoglobin: 13.3 g/dL (ref 12.0–15.0)
Lymphocytes Relative: 45.5 % (ref 12.0–46.0)
Lymphs Abs: 2.5 10*3/uL (ref 0.7–4.0)
MCHC: 33.6 g/dL (ref 30.0–36.0)
MCV: 90.1 fl (ref 78.0–100.0)
Monocytes Absolute: 0.6 10*3/uL (ref 0.1–1.0)
Monocytes Relative: 10.9 % (ref 3.0–12.0)
Neutro Abs: 2.2 10*3/uL (ref 1.4–7.7)
Neutrophils Relative %: 40.5 % — ABNORMAL LOW (ref 43.0–77.0)
Platelets: 397 10*3/uL (ref 150.0–400.0)
RBC: 4.4 Mil/uL (ref 3.87–5.11)
RDW: 13.5 % (ref 11.5–15.5)
WBC: 5.5 10*3/uL (ref 4.0–10.5)

## 2019-03-24 LAB — COMPREHENSIVE METABOLIC PANEL
ALT: 21 U/L (ref 0–35)
AST: 17 U/L (ref 0–37)
Albumin: 4 g/dL (ref 3.5–5.2)
Alkaline Phosphatase: 73 U/L (ref 39–117)
BUN: 22 mg/dL (ref 6–23)
CO2: 26 mEq/L (ref 19–32)
Calcium: 10.2 mg/dL (ref 8.4–10.5)
Chloride: 108 mEq/L (ref 96–112)
Creatinine, Ser: 1.05 mg/dL (ref 0.40–1.20)
GFR: 51.57 mL/min — ABNORMAL LOW (ref >60.00)
Glucose, Bld: 90 mg/dL (ref 70–99)
Potassium: 4.8 mEq/L (ref 3.5–5.1)
Sodium: 141 mEq/L (ref 135–145)
Total Bilirubin: 0.3 mg/dL (ref 0.2–1.2)
Total Protein: 6.2 g/dL (ref 6.0–8.3)

## 2019-03-24 LAB — LIPID PANEL
Cholesterol: 203 mg/dL — ABNORMAL HIGH (ref 0–200)
HDL: 49.6 mg/dL (ref >39.00)
NonHDL: 153.14
Total CHOL/HDL Ratio: 4
Triglycerides: 236 mg/dL — ABNORMAL HIGH (ref 0.0–149.0)
VLDL: 47.2 mg/dL — ABNORMAL HIGH (ref 0.0–40.0)

## 2019-03-24 LAB — TSH: TSH: 1.45 u[IU]/mL (ref 0.35–4.50)

## 2019-03-24 LAB — LDL CHOLESTEROL, DIRECT: Direct LDL: 103 mg/dL

## 2019-03-24 LAB — HEMOGLOBIN A1C: Hgb A1c MFr Bld: 5.9 % (ref 4.6–6.5)

## 2019-03-24 NOTE — Progress Notes (Signed)
Subjective:   Joann Buchanan is a 73 y.o. female who presents for Medicare Annual (Subsequent) preventive examination.  Review of Systems:    This visit is being conducted through telemedicine due to the COVID-19 pandemic. This patient has given me verbal consent via doximity to conduct this visit, patient states they are participating from their home address. Some vital signs may be absent or patient reported.    Patient identification: identified by name, DOB, and current address  Cardiac Risk Factors include: advanced age (>4mn, >>69women);hypertension     Objective:     Vitals: There were no vitals taken for this visit.  There is no height or weight on file to calculate BMI.  Advanced Directives 03/24/2019 10/10/2017 08/13/2017 08/12/2017 10/09/2016 06/06/2016 06/05/2016  Does Patient Have a Medical Advance Directive? No Yes No No No No No  Type of Advance Directive - Living will - - - - -  Would patient like information on creating a medical advance directive? No - Patient declined - No - Patient declined - - No - Patient declined -    Tobacco Social History   Tobacco Use  Smoking Status Never Smoker  Smokeless Tobacco Never Used     Counseling given: Not Answered   Clinical Intake:  Pre-visit preparation completed: Yes  Pain : No/denies pain     Nutritional Risks: None Diabetes: No  How often do you need to have someone help you when you read instructions, pamphlets, or other written materials from your doctor or pharmacy?: 1 - Never What is the last grade level you completed in school?: 12th grade  Interpreter Needed?: No  Information entered by :: CJohnson, LPN  Past Medical History:  Diagnosis Date   Allergic rhinitis    GERD (gastroesophageal reflux disease)    Goiter    ?    HTN (hypertension)    Hx of fibrocystic disease of breast    Melanoma ([facility]    Migraine    Numbness and tingling    bilateral hands   Osteoarthritis of hand    PONV  (postoperative nausea and vomiting)    Past Surgical History:  Procedure Laterality Date   ANTERIOR CERVICAL DECOMP/DISCECTOMY FUSION N/A 12/02/2015   Procedure: ANTERIOR CERVICAL DECOMPRESSION/DISCECTOMY FUSION CERVICAL FOUR-FIVE;  Surgeon: DEustace Moore MD;  [facility];  Service: Neurosurgery;  Laterality: N/A;   APPENDECTOMY     BREAST BIOPSY     normal   CATARACT EXTRACTION Right Jan 2017   CATARACT EXTRACTION W/ INTRAOCULAR LENS IMPLANT Left 2018   DEXA  7/07   negative   EMG     left upper arm- neg   ESOPHAGOGASTRODUODENOSCOPY     normal-24 hour PH probe (-)   EYE SURGERY  10/09   Left eye; for double vision   PARTIAL HYSTERECTOMY     Ovaries intact   TUBAL LIGATION     Family History  Problem Relation Age of Onset   Breast cancer Mother        Died, 665  Other Father        Died, 579- tree fell on him   Breast cancer Sister        Died, 558  Heart disease Sister        Died, 59  Healthy Son    Healthy Daughter    Social History   Socioeconomic History   Marital status: Married    Spouse name: Not on file  Number of children: 2   Years of education: Not on file   Highest education level: Not on file  Occupational History   Occupation: Retired    Fish farm manager: UNEMPLOYED  Social Designer, fashion/clothing strain: Not hard at all   Food insecurity    Worry: Never true    Inability: Never true   Transportation needs    Medical: No    Non-medical: No  Tobacco Use   Smoking status: Never Smoker   Smokeless tobacco: Never Used  Substance and Sexual Activity   Alcohol use: No    Alcohol/week: 0.0 standard drinks   Drug use: No   Sexual activity: Never  Lifestyle   Physical activity    Days per week: 4 days    Minutes per session: 30 min   Stress: Only a little  Relationships   Press photographer on phone: Not on file    Gets together: Not on file    Attends religious service: Not on file     Active member of club or organization: Not on file    Attends meetings of clubs or organizations: Not on file    Relationship status: Not on file  Other Topics Concern   Not on file  Social History Narrative   Lives with husband.  Retired Water quality scientist for [employer]   Married; 2 children; 3 grand children       Outpatient Encounter Medications as of 03/24/2019  Medication Sig   ALPRAZolam (XANAX) 0.25 MG tablet Take 1 tablet (0.25 mg total) by mouth as needed. Prior to flights   Aspirin-Salicylamide-Caffeine (BC HEADACHE) 325-95-16 MG TABS Take 1 packet by mouth daily as needed (for headache).   ibuprofen (ADVIL,MOTRIN) 200 MG tablet 200 mg.   methocarbamol (ROBAXIN) 500 MG tablet Take 1 tablet (500 mg total) by mouth 3 (three) times daily as needed (headache).   omeprazole (PRILOSEC) 40 MG capsule Take 1 capsule (40 mg total) by mouth daily.   Polyethyl Glycol-Propyl Glycol 0.4-0.3 % SOLN Place 1 drop into both eyes as needed.   SUMAtriptan (IMITREX) 20 MG/ACT nasal spray USE 1 SPRAY INTO THE NOSE EVERY 2 HOURS AS NEEDED FOR MIGRAINE. NO MORE THAN 2 DOSES IN 24 HOURS   No facility-administered encounter medications on file as of 03/24/2019.     Activities of Daily Living In your present state of health, do you have any difficulty performing the following activities: 03/24/2019  Hearing? N  Vision? N  Difficulty concentrating or making decisions? N  Walking or climbing stairs? N  Dressing or bathing? N  Doing errands, shopping? N  Preparing Food and eating ? N  Using the Toilet? N  In the past six months, have you accidently leaked urine? N  Do you have problems with loss of bowel control? N  Managing your Medications? N  Managing your Finances? N  Some recent data might be hidden    Patient Care Team: Tower, Wynelle Fanny, MD as PCP - General Marilynne Halsted, MD as Consulting Physician (Ophthalmology) Minna Merritts, MD as Consulting Physician  (Cardiology) Eustace Moore, MD as Consulting Physician (Neurosurgery) Alda Berthold, DO as Consulting Physician (Neurology)    Assessment:   This is a routine wellness examination for [place].  Exercise Activities and Dietary recommendations Current Exercise Habits: Home exercise routine, Type of exercise: walking, Time (Minutes): 30, Frequency (Times/Week): 4, Weekly Exercise (Minutes/Week): 120, Intensity: Mild, Exercise limited by: None identified  Goals  Increase physical activity     When weather permits, I will continue walking for 3 miles daily 2 days per week.      Patient Stated (pt-stated)     03/24/19, Patient wants to walk more daily and drink more water daily        Fall Risk Fall Risk  03/24/2019 10/10/2017 10/09/2016 06/28/2016 12/28/2015  Falls in the past year? 0 No No No No  Number falls in past yr: 0 - - - -  Follow up Falls evaluation completed;Falls prevention discussed - - - -   Is the patient's home free of loose throw rugs in walkways, pet beds, electrical cords, etc?   yes      Grab bars in the bathroom? no      Handrails on the stairs?   no      Adequate lighting?   yes  Timed Get Up and Go performed: n/a  Depression Screen PHQ 2/9 Scores 03/24/2019 10/10/2017 10/09/2016 10/05/2015  PHQ - 2 Score 0 1 0 0  PHQ- 9 Score 1 1 - -     Cognitive Function MMSE - Mini Mental State Exam 03/24/2019 10/10/2017 10/09/2016 10/05/2015  Orientation to time 5 5 5 5   Orientation to Place 5 5 5 5   Registration 3 3 3 3   Attention/ Calculation 5 0 0 0  Recall 3 3 3 3   Language- name 2 objects - 0 0 0  Language- repeat 1 1 1 1   Language- follow 3 step command - 3 3 3   Language- read & follow direction - 0 0 0  Write a sentence - 0 0 0  Copy design - 0 0 0  Total score - 20 20 20      Mini Cog  Mini-Cog screen was completed. Maximum score is 22. A value of 0 denotes this part of the MMSE was not completed or the patient failed this part of the Mini-Cog screening.     Immunization History  Administered Date(s) Administered   Pneumococcal Conjugate-13 10/16/2017   Td 05/08/2004   Zoster 03/23/2009    Qualifies for Shingles Vaccine?yes  Screening Tests Health Maintenance  Topic Date Due   MAMMOGRAM  04/16/2018   INFLUENZA VACCINE  10/07/2019 (Originally 02/07/2019)   Fecal DNA (Cologuard)  10/10/2019 (Originally 07/27/1997)   PNA vac Low Risk Adult (2 of 2 - PPSV23) 03/23/2020 (Originally 10/17/2018)   TETANUS/TDAP  05/07/2024 (Originally 05/08/2014)   DEXA SCAN  Completed   Hepatitis C Screening  Completed    Cancer Screenings: Lung: Low Dose CT Chest recommended if Age 53-80 years, 30 pack-year currently smoking OR have quit w/in 15years. Patient does not qualify. Breast:  Up to date on Mammogram? No, patient will schedule an appointment    Up to date of Bone Density/Dexa? Yes, completed 11/25/17 Colorectal: Patient received Cologuard kit in the mail and will complete this.   Additional Screenings: : Hepatitis C Screening: completed 10/09/16     Plan:    Patient wants to lose weight and increase water intake.   I have personally reviewed and noted the following in the patients chart:    Medical and social history  Use of alcohol, tobacco or illicit drugs   Current medications and supplements  Functional ability and status  Nutritional status  Physical activity  Advanced directives  List of other physicians  Hospitalizations, surgeries, and ER visits in previous 12 months  Vitals  Screenings to include cognitive, depression, and falls  Referrals and appointments  In addition, I have reviewed and discussed with patient certain preventive protocols, quality metrics, and best practice recommendations. A written personalized care plan for preventive services as well as general preventive health recommendations were provided to patient.     Andrez Grime, LPN  12/13/3014

## 2019-03-24 NOTE — Progress Notes (Signed)
PCP notes: none  Health Maintenance: Patient receive COloguard kit in the mail and she states she will complete it and send it in. Patient states she will schedule a mammogram appointment soon. Patient declined flu, pneumovax, tdap and shingrix vaccines.     Abnormal Screenings: none    Patient concerns: none    Nurse concerns: none    Next PCP appt.: 03/31/19 @ 8:30 am

## 2019-03-24 NOTE — Patient Instructions (Signed)
Joann Buchanan , Thank you for taking time to come for your Medicare Wellness Visit. I appreciate your ongoing commitment to your health goals. Please review the following plan we discussed and let me know if I can assist you in the future.   Screening recommendations/referrals: Colonoscopy: Patient received Cologuard kit in the mail and will complete this and send it back in Mammogram: Patient will schedule appointment for mammogram  Bone Density: completed 11/25/17 Recommended yearly ophthalmology/optometry visit for glaucoma screening and checkup Recommended yearly dental visit for hygiene and checkup  Vaccinations: Influenza vaccine: declined Pneumococcal vaccine: declined Tdap vaccine: declined Shingles vaccine: declined     Advanced directives: Advance directive discussed with you today. Even though you declined this today please call our office should you change your mind and we can give you the proper paperwork for you to fill out.   Conditions/risks identified: hypertension  Next appointment: 03/31/19 @ 8:30 am    Preventive Care 65 Years and Older, Female Preventive care refers to lifestyle choices and visits with your health care provider that can promote health and wellness. What does preventive care include?  A yearly physical exam. This is also called an annual well check.  Dental exams once or twice a year.  Routine eye exams. Ask your health care provider how often you should have your eyes checked.  Personal lifestyle choices, including:  Daily care of your teeth and gums.  Regular physical activity.  Eating a healthy diet.  Avoiding tobacco and drug use.  Limiting alcohol use.  Practicing safe sex.  Taking low-dose aspirin every day.  Taking vitamin and mineral supplements as recommended by your health care provider. What happens during an annual well check? The services and screenings done by your health care provider during your annual well check will  depend on your age, overall health, lifestyle risk factors, and family history of disease. Counseling  Your health care provider may ask you questions about your:  Alcohol use.  Tobacco use.  Drug use.  Emotional well-being.  Home and relationship well-being.  Sexual activity.  Eating habits.  History of falls.  Memory and ability to understand (cognition).  Work and work Statistician.  Reproductive health. Screening  You may have the following tests or measurements:  Height, weight, and BMI.  Blood pressure.  Lipid and cholesterol levels. These may be checked every 5 years, or more frequently if you are over 26 years old.  Skin check.  Lung cancer screening. You may have this screening every year starting at age 59 if you have a 30-pack-year history of smoking and currently smoke or have quit within the past 15 years.  Fecal occult blood test (FOBT) of the stool. You may have this test every year starting at age 47.  Flexible sigmoidoscopy or colonoscopy. You may have a sigmoidoscopy every 5 years or a colonoscopy every 10 years starting at age 83.  Hepatitis C blood test.  Hepatitis B blood test.  Sexually transmitted disease (STD) testing.  Diabetes screening. This is done by checking your blood sugar (glucose) after you have not eaten for a while (fasting). You may have this done every 1-3 years.  Bone density scan. This is done to screen for osteoporosis. You may have this done starting at age 59.  Mammogram. This may be done every 1-2 years. Talk to your health care provider about how often you should have regular mammograms. Talk with your health care provider about your test results, treatment options, and if  necessary, the need for more tests. Vaccines  Your health care provider may recommend certain vaccines, such as:  Influenza vaccine. This is recommended every year.  Tetanus, diphtheria, and acellular pertussis (Tdap, Td) vaccine. You may need a  Td booster every 10 years.  Zoster vaccine. You may need this after age 69.  Pneumococcal 13-valent conjugate (PCV13) vaccine. One dose is recommended after age 106.  Pneumococcal polysaccharide (PPSV23) vaccine. One dose is recommended after age 74. Talk to your health care provider about which screenings and vaccines you need and how often you need them. This information is not intended to replace advice given to you by your health care provider. Make sure you discuss any questions you have with your health care provider. Document Released: 07/22/2015 Document Revised: 03/14/2016 Document Reviewed: 04/26/2015 Elsevier Interactive Patient Education  2017 [employer] Prevention in the Home Falls can cause injuries. They can happen to people of all ages. There are many things you can do to make your home safe and to help prevent falls. What can I do on the outside of my home?  Regularly fix the edges of walkways and driveways and fix any cracks.  Remove anything that might make you trip as you walk through a door, such as a raised step or threshold.  Trim any bushes or trees on the path to your home.  Use bright outdoor lighting.  Clear any walking paths of anything that might make someone trip, such as rocks or tools.  Regularly check to see if handrails are loose or broken. Make sure that both sides of any steps have handrails.  Any raised decks and porches should have guardrails on the edges.  Have any leaves, snow, or ice cleared regularly.  Use sand or salt on walking paths during winter.  Clean up any spills in your garage right away. This includes oil or grease spills. What can I do in the bathroom?  Use night lights.  Install grab bars by the toilet and in the tub and shower. Do not use towel bars as grab bars.  Use non-skid mats or decals in the tub or shower.  If you need to sit down in the shower, use a plastic, non-slip stool.  Keep the floor dry. Clean  up any water that spills on the floor as soon as it happens.  Remove soap buildup in the tub or shower regularly.  Attach bath mats securely with double-sided non-slip rug tape.  Do not have throw rugs and other things on the floor that can make you trip. What can I do in the bedroom?  Use night lights.  Make sure that you have a light by your bed that is easy to reach.  Do not use any sheets or blankets that are too big for your bed. They should not hang down onto the floor.  Have a firm chair that has side arms. You can use this for support while you get dressed.  Do not have throw rugs and other things on the floor that can make you trip. What can I do in the kitchen?  Clean up any spills right away.  Avoid walking on wet floors.  Keep items that you use a lot in easy-to-reach places.  If you need to reach something above you, use a strong step stool that has a grab bar.  Keep electrical cords out of the way.  Do not use floor polish or wax that makes floors slippery. If you must  use wax, use non-skid floor wax.  Do not have throw rugs and other things on the floor that can make you trip. What can I do with my stairs?  Do not leave any items on the stairs.  Make sure that there are handrails on both sides of the stairs and use them. Fix handrails that are broken or loose. Make sure that handrails are as long as the stairways.  Check any carpeting to make sure that it is firmly attached to the stairs. Fix any carpet that is loose or worn.  Avoid having throw rugs at the top or bottom of the stairs. If you do have throw rugs, attach them to the floor with carpet tape.  Make sure that you have a light switch at the top of the stairs and the bottom of the stairs. If you do not have them, ask someone to add them for you. What else can I do to help prevent falls?  Wear shoes that:  Do not have high heels.  Have rubber bottoms.  Are comfortable and fit you well.  Are  closed at the toe. Do not wear sandals.  If you use a stepladder:  Make sure that it is fully opened. Do not climb a closed stepladder.  Make sure that both sides of the stepladder are locked into place.  Ask someone to hold it for you, if possible.  Clearly mark and make sure that you can see:  Any grab bars or handrails.  First and last steps.  Where the edge of each step is.  Use tools that help you move around (mobility aids) if they are needed. These include:  Canes.  Walkers.  Scooters.  Crutches.  Turn on the lights when you go into a dark area. Replace any light bulbs as soon as they burn out.  Set up your furniture so you have a clear path. Avoid moving your furniture around.  If any of your floors are uneven, fix them.  If there are any pets around you, be aware of where they are.  Review your medicines with your doctor. Some medicines can make you feel dizzy. This can increase your chance of falling. Ask your doctor what other things that you can do to help prevent falls. This information is not intended to replace advice given to you by your health care provider. Make sure you discuss any questions you have with your health care provider. Document Released: 04/21/2009 Document Revised: 12/01/2015 Document Reviewed: 07/30/2014 Elsevier Interactive Patient Education  2017 [employer].

## 2019-03-31 ENCOUNTER — Ambulatory Visit (INDEPENDENT_AMBULATORY_CARE_PROVIDER_SITE_OTHER): Payer: Medicare Other | Admitting: Family Medicine

## 2019-03-31 ENCOUNTER — Other Ambulatory Visit: Payer: Self-pay

## 2019-03-31 ENCOUNTER — Encounter: Payer: Self-pay | Admitting: Family Medicine

## 2019-03-31 VITALS — BP 128/78 | HR 75 | Temp 98.0°F | Ht 60.0 in | Wt 138.0 lb

## 2019-03-31 DIAGNOSIS — Z85820 Personal history of malignant melanoma of skin: Secondary | ICD-10-CM

## 2019-03-31 DIAGNOSIS — I10 Essential (primary) hypertension: Secondary | ICD-10-CM | POA: Diagnosis not present

## 2019-03-31 DIAGNOSIS — R7303 Prediabetes: Secondary | ICD-10-CM | POA: Diagnosis not present

## 2019-03-31 DIAGNOSIS — Z0000 Encounter for general adult medical examination without abnormal findings: Secondary | ICD-10-CM | POA: Diagnosis not present

## 2019-03-31 DIAGNOSIS — G43709 Chronic migraine without aura, not intractable, without status migrainosus: Secondary | ICD-10-CM

## 2019-03-31 DIAGNOSIS — Z1231 Encounter for screening mammogram for malignant neoplasm of breast: Secondary | ICD-10-CM

## 2019-03-31 DIAGNOSIS — M8580 Other specified disorders of bone density and structure, unspecified site: Secondary | ICD-10-CM

## 2019-03-31 MED ORDER — OMEPRAZOLE 40 MG PO CPDR: 40.0000 mg | DELAYED_RELEASE_CAPSULE | Freq: Every day | ORAL | 11 refills | Status: DC

## 2019-03-31 MED ORDER — SUMATRIPTAN 20 MG/ACT NA SOLN: NASAL | 11 refills | Status: DC

## 2019-03-31 MED ORDER — METHOCARBAMOL 500 MG PO TABS: 500.0000 mg | ORAL_TABLET | Freq: Three times a day (TID) | ORAL | 2 refills | Status: DC | PRN

## 2019-03-31 NOTE — Assessment & Plan Note (Signed)
Lab Results  Component Value Date   HGBA1C 5.9 03/24/2019   disc imp of low glycemic diet and wt loss to prevent DM2

## 2019-03-31 NOTE — Assessment & Plan Note (Signed)
Overdue for derm appt-pt plans to schedule this with Dr Jenetta Loges sun protection

## 2019-03-31 NOTE — Assessment & Plan Note (Signed)
Rev dexa 5/19  No falls or fractures  Not taking Ca or D-explained the importance of this  Good exercise-walking  Can do next dexa after 5/21

## 2019-03-31 NOTE — Assessment & Plan Note (Signed)
Becoming more persistent again  Disc lifestyle habits- needs to be better about fluid and caffeine intake  Refilled methocarbamol for neck tension and headache with caution of sedation  inst to f/u with her neurologist which she plans to do  Will continue to keep imitrex on hand

## 2019-03-31 NOTE — Assessment & Plan Note (Signed)
Pt is overdue for mammogram in setting of mother and sister with h/o breast cancer  Enc self breast exams  Nl exam today  Discussed importance of scheduling mammogram and she plans to do this herself

## 2019-03-31 NOTE — Progress Notes (Signed)
Subjective:    Patient ID: Joann Buchanan, female    DOB: 29-Oct-1945, 73 y.o.   MRN: [id number]  HPI Here for health maintenance exam and to review chronic medical problems    Continues to suffer from headaches Up to daily  Not severe  Pollen/seasons - trigger her a little  Uses BC powders Some neck pain- but most of the pressure is over L eye  Has not been to neurology -needs to go  Sees Dr Posey Pronto  She still takes imitrex when really needed  The methocarbamol helped some in the past   Drinks 1/2 of a small coke per day Decaf coffee  Does not drink enough fluid in general    Had amw on 9/15 She declines immunizations Was signed up for cologuard program -has the kit at home to do (never had a colonoscopy)  Needs to schedule a mammogram   Mammogram was 10/18  - needs to schedule  Self breast exam -no lumps or changes  Mother and sister had breast cancer  Personal h/o melanoma  Went last year  Sees Dr Jarome Matin  Has more dry areas  dexa 5/19 -stable osteopenia  Falls -none  Fractures-none in adulthood  Supplements- is not taking D or ca  Exercise -walking regularly  (taking care of a baby)   Weight  Wt Readings from Last 3 Encounters:  03/31/19 138 lb (62.6 kg)  10/16/17 136 lb 8 oz (61.9 kg)  10/10/17 138 lb 12 oz (62.9 kg)  stable 26.95 kg/m   HTN bp is stable today  No cp or palpitations or headaches or edema  No side effects to medicines  BP Readings from Last 3 Encounters:  03/31/19 128/78  10/16/17 130/80  10/10/17 110/84       Lab Results  Component Value Date   CREATININE 1.05 03/24/2019   BUN 22 03/24/2019   NA 141 03/24/2019   K 4.8 03/24/2019   CL 108 03/24/2019   CO2 26 03/24/2019   Lab Results  Component Value Date   ALT 21 03/24/2019   AST 17 03/24/2019   ALKPHOS 73 03/24/2019   BILITOT 0.3 03/24/2019    Lab Results  Component Value Date   WBC 5.5 03/24/2019   HGB 13.3 03/24/2019   HCT 39.6 03/24/2019   MCV 90.1  03/24/2019   PLT 397.0 03/24/2019   Lab Results  Component Value Date   TSH 1.45 03/24/2019     Prediabetes  Lab Results  Component Value Date   HGBA1C 5.9 03/24/2019  she does watch her diet  She avoids sweets for the most part     Cholesterol  Lab Results  Component Value Date   CHOL 203 (H) 03/24/2019   CHOL 188 10/10/2017   CHOL 183 08/13/2017   Lab Results  Component Value Date   HDL 49.60 03/24/2019   HDL 54.70 10/10/2017   HDL 51 08/13/2017   Lab Results  Component Value Date   LDLCALC 100 (H) 08/13/2017   LDLCALC 105 (H) 10/09/2016   LDLCALC 76 06/06/2016   Lab Results  Component Value Date   TRIG 236.0 (H) 03/24/2019   TRIG 338.0 (H) 10/10/2017   TRIG 160 (H) 08/13/2017   Lab Results  Component Value Date   CHOLHDL 4 03/24/2019   CHOLHDL 3 10/10/2017   CHOLHDL 3.6 08/13/2017   Lab Results  Component Value Date   LDLDIRECT 103.0 03/24/2019   LDLDIRECT 98.0 10/10/2017   LDLDIRECT 124.0 09/28/2015  Patient Active Problem List   Diagnosis Date Noted   Prediabetes 05/09/2018   Chest pain 08/13/2017   Encounter for screening mammogram for breast cancer 10/30/2016   S/P cervical spinal fusion 12/02/2015   Osteopenia 10/25/2015   Estrogen deficiency 10/05/2015   Routine general medical examination at a health care facility 09/25/2015   Caregiver stress 04/06/2015   Hand tingling 04/05/2015   Encounter for Medicare annual wellness exam 03/03/2013   OSTEOARTHRITIS, HANDS, BILATERAL 11/01/2009   Menopausal symptoms 03/23/2009   History of melanoma 02/04/2007   THYROMEGALY 02/04/2007   Migraine 02/04/2007   Essential hypertension 02/04/2007   ALLERGIC RHINITIS 02/04/2007   GERD 02/04/2007   Past Medical History:  Diagnosis Date   Allergic rhinitis    GERD (gastroesophageal reflux disease)    Goiter    ?    HTN (hypertension)    Hx of fibrocystic disease of breast    Melanoma ([place])    Migraine    Numbness  and tingling    bilateral hands   Osteoarthritis of hand    PONV (postoperative nausea and vomiting)    Past Surgical History:  Procedure Laterality Date   ANTERIOR CERVICAL DECOMP/DISCECTOMY FUSION N/A 12/02/2015   Procedure: ANTERIOR CERVICAL DECOMPRESSION/DISCECTOMY FUSION CERVICAL FOUR-FIVE;  Surgeon: Eustace Moore, MD;  Location: [facility];  Service: Neurosurgery;  Laterality: N/A;   APPENDECTOMY     BREAST BIOPSY     normal   CATARACT EXTRACTION Right Jan 2017   CATARACT EXTRACTION W/ INTRAOCULAR LENS IMPLANT Left 2018   DEXA  7/07   negative   EMG     left upper arm- neg   ESOPHAGOGASTRODUODENOSCOPY     normal-24 hour PH probe (-)   EYE SURGERY  10/09   Left eye; for double vision   PARTIAL HYSTERECTOMY     Ovaries intact   TUBAL LIGATION     Social History   Tobacco Use   Smoking status: Never Smoker   Smokeless tobacco: Never Used  Substance Use Topics   Alcohol use: No    Alcohol/week: 0.0 standard drinks   Drug use: No   Family History  Problem Relation Age of Onset   Breast cancer Mother        Died, 38   Other Father        Died, 26 - tree fell on him   Breast cancer Sister        Died, 71   Heart disease Sister        Died, 49   Healthy Son    Healthy Daughter    Allergies  Allergen Reactions   Sulfonamide Derivatives Anaphylaxis and Other (See Comments)    REACTION: unspecified Hives and swelling   Current Outpatient Medications on File Prior to Visit  Medication Sig Dispense Refill   ALPRAZolam (XANAX) 0.25 MG tablet Take 1 tablet (0.25 mg total) by mouth as needed. Prior to flights 30 tablet 0   Aspirin-Salicylamide-Caffeine (BC HEADACHE) 325-95-16 MG TABS Take 1 packet by mouth daily as needed (for headache).     ibuprofen (ADVIL,MOTRIN) 200 MG tablet 200 mg.     Polyethyl Glycol-Propyl Glycol 0.4-0.3 % SOLN Place 1 drop into both eyes as needed.     No current facility-administered medications on file prior  to visit.     Review of Systems  Constitutional: Negative for activity change, appetite change, fatigue, fever and unexpected weight change.  HENT: Negative for congestion, ear pain, rhinorrhea, sinus pressure and sore  throat.   Eyes: Negative for pain, redness and visual disturbance.  Respiratory: Negative for cough, shortness of breath and wheezing.   Cardiovascular: Negative for chest pain and palpitations.  Gastrointestinal: Negative for abdominal pain, blood in stool, constipation and diarrhea.  Endocrine: Negative for polydipsia and polyuria.  Genitourinary: Negative for dysuria, frequency and urgency.  Musculoskeletal: Negative for arthralgias, back pain and myalgias.  Skin: Negative for pallor and rash.  Allergic/Immunologic: Negative for environmental allergies.  Neurological: Positive for headaches. Negative for dizziness and syncope.  Hematological: Negative for adenopathy. Does not bruise/bleed easily.  Psychiatric/Behavioral: Negative for decreased concentration and dysphoric mood. The patient is not nervous/anxious.        Objective:   Physical Exam Constitutional:      General: She is not in acute distress.    Appearance: Normal appearance. She is well-developed and normal weight. She is not ill-appearing or diaphoretic.  HENT:     Head: Normocephalic and atraumatic.     Right Ear: Tympanic membrane, ear canal and external ear normal.     Left Ear: Tympanic membrane, ear canal and external ear normal.     Nose: Nose normal. No congestion.     Mouth/Throat:     Mouth: Mucous membranes are moist.     Pharynx: Oropharynx is clear. No posterior oropharyngeal erythema.  Eyes:     General: No scleral icterus.    Extraocular Movements: Extraocular movements intact.     Conjunctiva/sclera: Conjunctivae normal.     Pupils: Pupils are equal, round, and reactive to light.  Neck:     Musculoskeletal: Normal range of motion and neck supple. No neck rigidity or muscular  tenderness.     Thyroid: No thyromegaly.     Vascular: No carotid bruit or JVD.  Cardiovascular:     Rate and Rhythm: Normal rate and regular rhythm.     Pulses: Normal pulses.     Heart sounds: Normal heart sounds. No gallop.   Pulmonary:     Effort: Pulmonary effort is normal. No respiratory distress.     Breath sounds: Normal breath sounds. No wheezing.     Comments: Good air exch Chest:     Chest wall: No tenderness.  Abdominal:     General: Bowel sounds are normal. There is no distension or abdominal bruit.     Palpations: Abdomen is soft. There is no mass.     Tenderness: There is no abdominal tenderness.     Hernia: No hernia is present.  Genitourinary:    Comments: Breast exam: No mass, nodules, thickening, tenderness, bulging, retraction, inflamation, nipple discharge or skin changes noted.  No axillary or clavicular LA.     Musculoskeletal: Normal range of motion.        General: No tenderness.     Right lower leg: No edema.     Left lower leg: No edema.     Comments: No kyphosis   Lymphadenopathy:     Cervical: No cervical adenopathy.  Skin:    General: Skin is warm and dry.     Coloration: Skin is not pale.     Findings: No erythema or rash.     Comments: Solar lentigines diffusely   Neurological:     Mental Status: She is alert. Mental status is at baseline.     Cranial Nerves: No cranial nerve deficit.     Motor: No abnormal muscle tone.     Coordination: Coordination normal.     Gait: Gait normal.  Deep Tendon Reflexes: Reflexes are normal and symmetric. Reflexes normal.  Psychiatric:        Mood and Affect: Mood normal.        Cognition and Memory: Cognition and memory normal.           Assessment & Plan:   Problem List Items Addressed This Visit      Cardiovascular and Mediastinum   Migraine    Becoming more persistent again  Disc lifestyle habits- needs to be better about fluid and caffeine intake  Refilled methocarbamol for neck tension  and headache with caution of sedation  inst to f/u with her neurologist which she plans to do  Will continue to keep imitrex on hand      Relevant Medications   methocarbamol (ROBAXIN) 500 MG tablet   SUMAtriptan (IMITREX) 20 MG/ACT nasal spray   Essential hypertension    bp in fair control at this time  BP Readings from Last 1 Encounters:  03/31/19 128/78   No changes needed Most recent labs reviewed  Disc lifstyle change with low sodium diet and exercise          Musculoskeletal and Integument   Osteopenia    Rev dexa 5/19  No falls or fractures  Not taking Ca or D-explained the importance of this  Good exercise-walking  Can do next dexa after 5/21        Other   History of melanoma    Overdue for derm appt-pt plans to schedule this with Dr Jenetta Loges sun protection       Routine general medical examination at a health care facility - Primary    Reviewed health habits including diet and exercise and skin cancer prevention Reviewed appropriate screening tests for age  Also reviewed health mt list, fam hx and immunization status , as well as social and family history   See HPI Labs reviewed  Rev amw  Declines all immunizations unfortunately  Also declines colonoscopy (but will do cologuard test that was already delivered) Overdue for mammogram (in setting of fam hx of breast cancer)-enc pt to schedule this  dexa reviewed Enc f/u dermatology (past h/o melanoma)        Encounter for screening mammogram for breast cancer    Pt is overdue for mammogram in setting of mother and sister with h/o breast cancer  Enc self breast exams  Nl exam today  Discussed importance of scheduling mammogram and she plans to do this herself       Prediabetes    Lab Results  Component Value Date   HGBA1C 5.9 03/24/2019   disc imp of low glycemic diet and wt loss to prevent DM2

## 2019-03-31 NOTE — Patient Instructions (Addendum)
Aim for 64 oz of non caffeine fluid per day   Get back to the neurologist for the headaches   Don't forget to schedule your mammogram   Don't forget to schedule your dermatology appointment  If you need a referral just call us   Try to get 1200-1500 mg of calcium per day with at least 1000 iu of vitamin D - for bone health   Keep walkng!

## 2019-03-31 NOTE — Assessment & Plan Note (Signed)
Reviewed health habits including diet and exercise and skin cancer prevention Reviewed appropriate screening tests for age  Also reviewed health mt list, fam hx and immunization status , as well as social and family history   See HPI Labs reviewed  Rev amw  Declines all immunizations unfortunately  Also declines colonoscopy (but will do cologuard test that was already delivered) Overdue for mammogram (in setting of fam hx of breast cancer)-enc pt to schedule this  dexa reviewed Enc f/u dermatology (past h/o melanoma)

## 2019-03-31 NOTE — Assessment & Plan Note (Signed)
bp in fair control at this time  BP Readings from Last 1 Encounters:  03/31/19 128/78   No changes needed Most recent labs reviewed  Disc lifstyle change with low sodium diet and exercise

## 2019-06-19 ENCOUNTER — Encounter: Payer: Self-pay | Admitting: Family Medicine

## 2019-06-19 ENCOUNTER — Ambulatory Visit (INDEPENDENT_AMBULATORY_CARE_PROVIDER_SITE_OTHER): Payer: Medicare Other | Admitting: Family Medicine

## 2019-06-19 ENCOUNTER — Telehealth: Payer: Self-pay

## 2019-06-19 VITALS — HR 85

## 2019-06-19 DIAGNOSIS — J0100 Acute maxillary sinusitis, unspecified: Secondary | ICD-10-CM

## 2019-06-19 MED ORDER — AMOXICILLIN-POT CLAVULANATE 875-125 MG PO TABS: 1.0000 | ORAL_TABLET | Freq: Two times a day (BID) | ORAL | 0 refills | Status: DC

## 2019-06-19 MED ORDER — PREDNISONE 10 MG PO TABS: ORAL_TABLET | ORAL | 0 refills | Status: DC

## 2019-06-19 NOTE — Assessment & Plan Note (Signed)
S/p covid 19 with congestion Facial pain/ purulent nasal d//c and congestion  Chest symptoms are minimal   tx with augmentin and low dose prednisone taper  Disc side eff Saline/rest /fluids Continue to isolate Update if not starting to improve in a week or if worsening

## 2019-06-19 NOTE — Telephone Encounter (Signed)
I will see her then  

## 2019-06-19 NOTE — Progress Notes (Signed)
Virtual Visit via Video Note  I connected with [name] on 06/19/19 at  3:45 PM EST by a video enabled telemedicine application and verified that I am speaking with the correct person using two identifiers.  Location: Patient: home Provider: office    I discussed the limitations of evaluation and management by telemedicine and the availability of in person appointments. The patient expressed understanding and agreed to proceed.  Parties involved in encounter  Patient Joann Buchanan Provider Loura Pardon MD   History of Present Illness: Pt presents with sinus/nasal congestion   Sinus symptoms since nov 12 Was tested posivie for covid -just after thanksgiving  Whole family got it    Husband had antibody infusion for covid  Nasal d/c is colored/ yellow and green with blood  Some chills at night  No fever -last temp 98.0  Feels worse at night  Ached a few days-tylenol helped   No chest congestion-just coughs a little bit  No shortness of breath  Nasal congestion    Taking sudafed Some mucinex  Has not used nasal sprays  Has saline-has not used it yet   Patient Active Problem List   Diagnosis Date Noted  . Prediabetes 05/09/2018  . Chest pain 08/13/2017  . Encounter for screening mammogram for breast cancer 10/30/2016  . S/P cervical spinal fusion 12/02/2015  . Osteopenia 10/25/2015  . Estrogen deficiency 10/05/2015  . Routine general medical examination at a health care facility 09/25/2015  . Acute sinusitis 07/06/2015  . Caregiver stress 04/06/2015  . Hand tingling 04/05/2015  . Encounter for Medicare annual wellness exam 03/03/2013  . OSTEOARTHRITIS, HANDS, BILATERAL 11/01/2009  . Menopausal symptoms 03/23/2009  . History of melanoma 02/04/2007  . THYROMEGALY 02/04/2007  . Migraine 02/04/2007  . Essential hypertension 02/04/2007  . ALLERGIC RHINITIS 02/04/2007  . GERD 02/04/2007   Past Medical History:  Diagnosis Date  . Allergic rhinitis   . GERD  (gastroesophageal reflux disease)   . Goiter    ?   Marland Kitchen HTN (hypertension)   . Hx of fibrocystic disease of breast   . Melanoma ([place])   . Migraine   . Numbness and tingling    bilateral hands  . Osteoarthritis of hand   . PONV (postoperative nausea and vomiting)    Past Surgical History:  Procedure Laterality Date  . ANTERIOR CERVICAL DECOMP/DISCECTOMY FUSION N/A 12/02/2015   Procedure: ANTERIOR CERVICAL DECOMPRESSION/DISCECTOMY FUSION CERVICAL FOUR-FIVE;  Surgeon: Eustace Moore, MD;  Location: [facility];  Service: Neurosurgery;  Laterality: N/A;  . APPENDECTOMY    . BREAST BIOPSY     normal  . CATARACT EXTRACTION Right Jan 2017  . CATARACT EXTRACTION W/ INTRAOCULAR LENS IMPLANT Left 2018  . DEXA  7/07   negative  . EMG     left upper arm- neg  . ESOPHAGOGASTRODUODENOSCOPY     normal-24 hour PH probe (-)  . EYE SURGERY  10/09   Left eye; for double vision  . PARTIAL HYSTERECTOMY     Ovaries intact  . TUBAL LIGATION     Social History   Tobacco Use  . Smoking status: Never Smoker  . Smokeless tobacco: Never Used  Substance Use Topics  . Alcohol use: No    Alcohol/week: 0.0 standard drinks  . Drug use: No   Family History  Problem Relation Age of Onset  . Breast cancer Mother        Died, 8  . Other Father  Died, 57 - tree fell on him  . Breast cancer Sister        Died, 55  . Heart disease Sister        Died, 35  . Healthy Son   . Healthy Daughter    Allergies  Allergen Reactions  . Sulfonamide Derivatives Anaphylaxis and Other (See Comments)    REACTION: unspecified Hives and swelling   Current Outpatient Medications on File Prior to Visit  Medication Sig Dispense Refill  . ALPRAZolam (XANAX) 0.25 MG tablet Take 1 tablet (0.25 mg total) by mouth as needed. Prior to flights 30 tablet 0  . Aspirin-Salicylamide-Caffeine (BC HEADACHE) 325-95-16 MG TABS Take 1 packet by mouth daily as needed (for headache).    Marland Kitchen ibuprofen (ADVIL,MOTRIN) 200 MG  tablet 200 mg.    . methocarbamol (ROBAXIN) 500 MG tablet Take 1 tablet (500 mg total) by mouth 3 (three) times daily as needed (headache). 30 tablet 2  . omeprazole (PRILOSEC) 40 MG capsule Take 1 capsule (40 mg total) by mouth daily. 30 capsule 11  . Polyethyl Glycol-Propyl Glycol 0.4-0.3 % SOLN Place 1 drop into both eyes as needed.    . SUMAtriptan (IMITREX) 20 MG/ACT nasal spray USE 1 SPRAY INTO THE NOSE EVERY 2 HOURS AS NEEDED FOR MIGRAINE. NO MORE THAN 2 DOSES IN 24 HOURS 1 Inhaler 11   No current facility-administered medications on file prior to visit.   Review of Systems  Constitutional: Negative for chills, fever and malaise/fatigue.  HENT: Positive for congestion, sinus pain and sore throat. Negative for ear discharge and ear pain.   Eyes: Negative for blurred vision, discharge and redness.  Respiratory: Positive for cough. Negative for shortness of breath and stridor.   Cardiovascular: Negative for chest pain, palpitations and leg swelling.  Gastrointestinal: Negative for abdominal pain, diarrhea, nausea and vomiting.  Musculoskeletal: Negative for myalgias.  Skin: Negative for rash.  Neurological: Positive for headaches. Negative for dizziness.      Observations/Objective: Patient appears well, in no distress Weight is baseline  No facial swelling or asymmetry Mildly hoarse voice, also sounds nasally congested  No obvious tremor or mobility impairment Moving neck and UEs normally Able to hear the call well  No cough or shortness of breath during interview  Talkative and mentally sharp with no cognitive changes No skin changes on face or neck , no rash or pallor Affect is normal    Assessment and Plan: Problem List Items Addressed This Visit      Respiratory   Acute sinusitis - Primary    S/p covid 19 with congestion Facial pain/ purulent nasal d//c and congestion  Chest symptoms are minimal   tx with augmentin and low dose prednisone taper  Disc side  eff Saline/rest /fluids Continue to isolate Update if not starting to improve in a week or if worsening        Relevant Medications   amoxicillin-clavulanate (AUGMENTIN) 875-125 MG tablet   predniSONE (DELTASONE) 10 MG tablet       Follow Up Instructions: Start using nasal saline Breath steam-this helps also   Take prednisone and augmentin as directed   Continue to isolate yourself   Update if not starting to improve in a week or if worsening     I discussed the assessment and treatment plan with the patient. The patient was provided an opportunity to ask questions and all were answered. The patient agreed with the plan and demonstrated an understanding of the instructions.   The  patient was advised to call back or seek an in-person evaluation if the symptoms worsen or if the condition fails to improve as anticipated.     Loura Pardon, MD

## 2019-06-19 NOTE — Telephone Encounter (Signed)
Pt said she was dx with covid about 2 wks ago; pt has had sinus infection since end of Oct. Pt has head congestion and this morning when blew nose had blood tinged mucus, low fever and chills on and off, usually at night. Pt wants abx. Pt scheduled virtual appt with Dr Glori Bickers today at 3:45. Pt will have vitals ready when CMA calls.

## 2019-06-19 NOTE — Patient Instructions (Addendum)
Start using nasal saline Breath steam-this helps also   Take prednisone and augmentin as directed   Continue to isolate yourself   Update if not starting to improve in a week or if worsening

## 2019-06-21 ENCOUNTER — Encounter: Payer: Self-pay | Admitting: Family Medicine

## 2020-01-25 ENCOUNTER — Other Ambulatory Visit: Payer: Self-pay | Admitting: Family Medicine

## 2020-03-31 ENCOUNTER — Other Ambulatory Visit: Payer: Self-pay | Admitting: Family Medicine

## 2020-04-01 NOTE — Telephone Encounter (Signed)
CPE was 03/31/19, last filled on 03/31/19 1 inhaler with 11 refills, no future appts., please advise

## 2020-04-01 NOTE — Telephone Encounter (Signed)
Med refilled once and Carrie will reach out to try and get appt scheduled  

## 2020-04-01 NOTE — Telephone Encounter (Signed)
Please schedule PE and refill until then  

## 2020-04-01 NOTE — Telephone Encounter (Signed)
I spoke to patient's husband.  Patient wasn't home. Husband said he would let patient know to call and schedule appointment.

## 2020-04-27 ENCOUNTER — Other Ambulatory Visit: Payer: Self-pay | Admitting: Family Medicine

## 2020-04-27 NOTE — Telephone Encounter (Signed)
Pt was suppose to schedule f/u appt last time we filled Rx, tried to get her to schedule appt but no CPE was ever made. Rx declined until pt makes appt

## 2020-06-06 ENCOUNTER — Other Ambulatory Visit: Payer: Self-pay | Admitting: Family Medicine

## 2020-06-07 NOTE — Telephone Encounter (Signed)
Pt still hasn't made a CPE or f/u and was notified months ago an appt is required. Rx declined until pt makes appt

## 2020-11-29 ENCOUNTER — Ambulatory Visit
Admission: EM | Admit: 2020-11-29 | Discharge: 2020-11-29 | Disposition: A | Discharge status: Home / Self Care | Payer: Medicare Other | Attending: Emergency Medicine | Admitting: Emergency Medicine

## 2020-11-29 ENCOUNTER — Other Ambulatory Visit: Payer: Self-pay

## 2020-11-29 DIAGNOSIS — J069 Acute upper respiratory infection, unspecified: Secondary | ICD-10-CM | POA: Diagnosis not present

## 2020-11-29 DIAGNOSIS — Z1152 Encounter for screening for COVID-19: Secondary | ICD-10-CM

## 2020-11-29 MED ORDER — BENZONATATE 100 MG PO CAPS: 100.0000 mg | ORAL_CAPSULE | Freq: Three times a day (TID) | ORAL | 0 refills | Status: DC | PRN

## 2020-11-29 NOTE — Discharge Instructions (Addendum)
Your COVID and Influenza tests are pending.  You should self quarantine until the test results are back.    Take Tylenol or ibuprofen as needed for fever or discomfort.  Rest and keep yourself hydrated.    Follow-up with your primary care provider if your symptoms are not improving.

## 2020-11-29 NOTE — ED Provider Notes (Signed)
Joann Buchanan    CSN: [id number] Arrival date & time: 11/29/20  1115      History   Chief Complaint Chief Complaint  Patient presents with  . Facial Pain    HPI Joann Buchanan is a 75 y.o. female.   Patient presents with 4-day history of ear pain, sore throat, nasal congestion, postnasal drip, runny nose, cough.  She also reports low-grade fever of 100 yesterday.  She took Tylenol at that time but has not taken any today.  She also has been treating her symptoms with allergy medication.  She denies rash, shortness of breath, vomiting, diarrhea, or other symptoms.  Her medical history includes hypertension, prediabetes, allergic rhinitis, migraine headaches, GERD, osteoarthritis.  The history is provided by the patient and medical records.    Past Medical History:  Diagnosis Date  . Allergic rhinitis   . GERD (gastroesophageal reflux disease)   . Goiter    ?   Marland Kitchen HTN (hypertension)   . Hx of fibrocystic disease of breast   . Melanoma ([place])   . Migraine   . Numbness and tingling    bilateral hands  . Osteoarthritis of hand   . PONV (postoperative nausea and vomiting)     Patient Active Problem List   Diagnosis Date Noted  . Prediabetes 05/09/2018  . Chest pain 08/13/2017  . Encounter for screening mammogram for breast cancer 10/30/2016  . S/P cervical spinal fusion 12/02/2015  . Osteopenia 10/25/2015  . Estrogen deficiency 10/05/2015  . Routine general medical examination at a health care facility 09/25/2015  . Acute sinusitis 07/06/2015  . Caregiver stress 04/06/2015  . Hand tingling 04/05/2015  . Encounter for Medicare annual wellness exam 03/03/2013  . OSTEOARTHRITIS, HANDS, BILATERAL 11/01/2009  . Menopausal symptoms 03/23/2009  . History of melanoma 02/04/2007  . THYROMEGALY 02/04/2007  . Migraine 02/04/2007  . Essential hypertension 02/04/2007  . ALLERGIC RHINITIS 02/04/2007  . GERD 02/04/2007    Past Surgical History:  Procedure Laterality Date   . ANTERIOR CERVICAL DECOMP/DISCECTOMY FUSION N/A 12/02/2015   Procedure: ANTERIOR CERVICAL DECOMPRESSION/DISCECTOMY FUSION CERVICAL FOUR-FIVE;  Surgeon: Eustace Moore, MD;  Location: [facility];  Service: Neurosurgery;  Laterality: N/A;  . APPENDECTOMY    . BREAST BIOPSY     normal  . CATARACT EXTRACTION Right Jan 2017  . CATARACT EXTRACTION W/ INTRAOCULAR LENS IMPLANT Left 2018  . DEXA  7/07   negative  . EMG     left upper arm- neg  . ESOPHAGOGASTRODUODENOSCOPY     normal-24 hour PH probe (-)  . EYE SURGERY  10/09   Left eye; for double vision  . PARTIAL HYSTERECTOMY     Ovaries intact  . TUBAL LIGATION      OB History   No obstetric history on file.      Home Medications    Prior to Admission medications   Medication Sig Start Date End Date Taking? Authorizing Provider  ALPRAZolam (XANAX) 0.25 MG tablet Take 1 tablet (0.25 mg total) by mouth as needed. Prior to flights 06/19/17  Yes Tower, Wynelle Fanny, MD  Aspirin-Salicylamide-Caffeine 865-78-46 MG TABS Take 1 packet by mouth daily as needed (for headache).   Yes [provider]  benzonatate (TESSALON) 100 MG capsule Take 1 capsule (100 mg total) by mouth 3 (three) times daily as needed for cough. 11/29/20  Yes Sharion Balloon, NP  ibuprofen (ADVIL,MOTRIN) 200 MG tablet 200 mg.   Yes [provider]  methocarbamol (ROBAXIN) 500  MG tablet TAKE 1 TABLET (500 MG TOTAL) BY MOUTH 3 (THREE) TIMES DAILY AS NEEDED (HEADACHE). 01/26/20  Yes Tower, Wynelle Fanny, MD  omeprazole (PRILOSEC) 40 MG capsule Take 1 capsule (40 mg total) by mouth daily. 03/31/19  Yes Tower, Wynelle Fanny, MD  Polyethyl Glycol-Propyl Glycol 0.4-0.3 % SOLN Place 1 drop into both eyes as needed.   Yes [provider]  SUMAtriptan (IMITREX) 20 MG/ACT nasal spray USE 1 SPRAY INTO THE NOSE EVERY 2 HOURS AS NEEDED FOR MIGRAINE. NO MORE THAN 2 DOSES IN 24 HOURS 04/01/20  Yes Tower, Wynelle Fanny, MD  amoxicillin-clavulanate (AUGMENTIN) 875-125 MG tablet Take 1  tablet by mouth 2 (two) times daily. 06/19/19   Tower, Wynelle Fanny, MD  predniSONE (DELTASONE) 10 MG tablet Take 3 pills once daily by mouth for 3 days, then 2 pills once daily for 3 days, then 1 pill once daily for 3 days and then stop 06/19/19   Tower, Wynelle Fanny, MD    Family History Family History  Problem Relation Age of Onset  . Breast cancer Mother        Died, 40  . Other Father        Died, 68 - tree fell on him  . Breast cancer Sister        Died, 18  . Heart disease Sister        Died, 63  . Healthy Son   . Healthy Daughter     Social History Social History   Tobacco Use  . Smoking status: Never Smoker  . Smokeless tobacco: Never Used  Substance Use Topics  . Alcohol use: No    Alcohol/week: 0.0 standard drinks  . Drug use: No     Allergies   Sulfonamide derivatives   Review of Systems Review of Systems  Constitutional: Positive for fever. Negative for chills.  HENT: Positive for congestion, ear pain, postnasal drip, rhinorrhea, sinus pressure and sore throat.   Respiratory: Positive for cough. Negative for shortness of breath.   Cardiovascular: Negative for chest pain and palpitations.  Gastrointestinal: Negative for abdominal pain, diarrhea and vomiting.  Skin: Negative for color change and rash.  All other systems reviewed and are negative.    Physical Exam Triage Vital Signs ED Triage Vitals  Enc Vitals Group     BP      Pulse      Resp      Temp      Temp src      SpO2      Weight      Height      Head Circumference      Peak Flow      Pain Score      Pain Loc      Pain Edu?      Excl. in [place]?    No data found.  Updated Vital Signs BP 131/87 (BP Location: Left Arm)   Pulse (!) 116   Temp 99.3 F (37.4 C) (Oral)   Resp 20   SpO2 96%   Visual Acuity Right Eye Distance:   Left Eye Distance:   Bilateral Distance:    Right Eye Near:   Left Eye Near:    Bilateral Near:     Physical Exam Vitals and nursing note reviewed.   Constitutional:      General: She is not in acute distress.    Appearance: She is well-developed.  HENT:     Head: Normocephalic and atraumatic.  Right Ear: Tympanic membrane normal.     Left Ear: Tympanic membrane normal.     Nose: Rhinorrhea present.     Mouth/Throat:     Mouth: Mucous membranes are moist.     Pharynx: Oropharynx is clear.     Comments: Clear postnasal drip. Eyes:     Conjunctiva/sclera: Conjunctivae normal.  Cardiovascular:     Rate and Rhythm: Normal rate and regular rhythm.     Heart sounds: Normal heart sounds.  Pulmonary:     Effort: Pulmonary effort is normal. No respiratory distress.     Breath sounds: Normal breath sounds.  Abdominal:     Palpations: Abdomen is soft.     Tenderness: There is no abdominal tenderness.  Musculoskeletal:     Cervical back: Neck supple.  Skin:    General: Skin is warm and dry.  Neurological:     General: No focal deficit present.     Mental Status: She is alert and oriented to person, place, and time.     Gait: Gait normal.  Psychiatric:        Mood and Affect: Mood normal.        Behavior: Behavior normal.      UC Treatments / Results  Labs (all labs ordered are listed, but only abnormal results are displayed) Labs Reviewed  COVID-19, FLU A+B NAA    EKG   Radiology No results found.  Procedures Procedures (including critical care time)  Medications Ordered in UC Medications - No data to display  Initial Impression / Assessment and Plan / UC Course  I have reviewed the triage vital signs and the nursing notes.  Pertinent labs & imaging results that were available during my care of the patient were reviewed by me and considered in my medical decision making (see chart for details).   Viral URI with cough.  Treating cough with Tessalon.  Influenza and COVID pending.  Instructed patient to self quarantine until the test results are back.  Discussed symptomatic treatment including Tylenol or  ibuprofen, rest, hydration.  Instructed patient to follow up with PCP if her symptoms are not improving.  Patient agrees to plan of care.    Final Clinical Impressions(s) / UC Diagnoses   Final diagnoses:  Viral URI with cough  Encounter for screening for COVID-19     Discharge Instructions     Your COVID and Influenza tests are pending.  You should self quarantine until the test results are back.    Take Tylenol or ibuprofen as needed for fever or discomfort.  Rest and keep yourself hydrated.    Follow-up with your primary care provider if your symptoms are not improving.        ED Prescriptions    Medication Sig Dispense Auth. Provider   benzonatate (TESSALON) 100 MG capsule Take 1 capsule (100 mg total) by mouth 3 (three) times daily as needed for cough. 21 capsule Sharion Balloon, NP     PDMP not reviewed this encounter.   Sharion Balloon, NP 11/29/20 1149

## 2020-11-29 NOTE — ED Triage Notes (Signed)
Pt presents with bilateral earache, sore throat, congestion in throat, "teeny tiny" cough, sinus pressure x 4 days.  Has taken Tylenol and allergy just PTA.  States temp overnight was 100.  At home COVID test was negative.  Not interested in PCR testing.

## 2020-11-30 ENCOUNTER — Ambulatory Visit (INDEPENDENT_AMBULATORY_CARE_PROVIDER_SITE_OTHER): Payer: Medicare Other | Admitting: Family Medicine

## 2020-11-30 ENCOUNTER — Encounter: Payer: Self-pay | Admitting: Family Medicine

## 2020-11-30 DIAGNOSIS — F4321 Adjustment disorder with depressed mood: Secondary | ICD-10-CM

## 2020-11-30 DIAGNOSIS — J0100 Acute maxillary sinusitis, unspecified: Secondary | ICD-10-CM | POA: Diagnosis not present

## 2020-11-30 LAB — COVID-19, FLU A+B NAA
Influenza A, NAA: NOT DETECTED
Influenza B, NAA: NOT DETECTED
SARS-CoV-2, NAA: NOT DETECTED

## 2020-11-30 MED ORDER — TRAZODONE HCL 50 MG PO TABS: 25.0000 mg | ORAL_TABLET | Freq: Every evening | ORAL | 3 refills | Status: DC | PRN

## 2020-11-30 MED ORDER — PREDNISONE 10 MG PO TABS: ORAL_TABLET | ORAL | 0 refills | Status: DC

## 2020-11-30 NOTE — Assessment & Plan Note (Addendum)
With 5 d of uri  Home covid test neg, pending pcr and flu tests from UC Suspect viral  Reviewed urgent care records in detail with pt  Will tx severe congestion and facial pain with prednisone taper 30 mg (disc side eff)  Fluids/rest  Salt water gargles and saline nasal irrigation prn  Watch for fever/purulent drainage or worse facial pain Update if not starting to improve in a week or if worsening

## 2020-11-30 NOTE — Progress Notes (Signed)
Virtual Visit via Video Note  I connected with Joann Buchanan on 11/30/20 at  2:00 PM EDT by a video enabled telemedicine application and verified that I am speaking with the correct person using two identifiers.  Location: Patient: home Provider: office   I discussed the limitations of evaluation and management by telemedicine and the availability of in person appointments. The patient expressed understanding and agreed to proceed.  Parties involved in encounter  Patient: Joann Buchanan  Provider:  Loura Pardon MD   History of Present Illness: Pt presents for c/o uri symptoms and low grade fever Also facial pain and ear pain  Also hears heartbeat in her ear (she feels and hears it)   Also grief -lost husband   This happened after working in the yard (pollen bothered her more than usual)  She was seen at [facility] yesterday  Symptoms started Saturday -nose burning with runny nose and sneezing   She did home covid test and it was negative   She requests px for prednisone to clear up sinus problem   Congestion -nasal , mucous is clear  When she clears her throat - some white mucous  Scant prod cough -just a speck of mucous  No wheezing or sob   Sinus pain is in cheeks -left more than the right    Otc: nothing right now Gargles with warm salt water    Also her husband died in 2022/08/13  She has not slept  She is around people and does pretty good/ has her moments  Tearful as expected  Has lost 10-15 lb , no appetite  Still keeps great grand daughter 2 d per week  Is very busy  1 cup of coffee in the am  Trouble falling and staying asleep  Tried tylenol pm- works some of the time  Extra xanax-took one once  She has grief counseling planned at church (group tx as well)    A/P from that note:  Viral URI with cough.  Treating cough with Tessalon.  Influenza and COVID pending.  Instructed patient to self quarantine until the test results are back.  Discussed symptomatic  treatment including Tylenol or ibuprofen, rest, hydration.  Instructed patient to follow up with PCP if her symptoms are not improving.  Patient agrees to plan of care.  covid and flu tests are still pending  She has had covid in the past   Not immunized for covid 19    Patient Active Problem List   Diagnosis Date Noted  . Grief reaction 11/30/2020  . Prediabetes 05/09/2018  . Chest pain 08/13/2017  . Encounter for screening mammogram for breast cancer 10/30/2016  . S/P cervical spinal fusion 12/02/2015  . Osteopenia 10/25/2015  . Estrogen deficiency 10/05/2015  . Routine general medical examination at a health care facility 09/25/2015  . Acute sinusitis 07/06/2015  . Caregiver stress 04/06/2015  . Hand tingling 04/05/2015  . Encounter for Medicare annual wellness exam 03/03/2013  . OSTEOARTHRITIS, HANDS, BILATERAL 11/01/2009  . Menopausal symptoms 03/23/2009  . History of melanoma 02/04/2007  . THYROMEGALY 02/04/2007  . Migraine 02/04/2007  . Essential hypertension 02/04/2007  . ALLERGIC RHINITIS 02/04/2007  . GERD 02/04/2007   Past Medical History:  Diagnosis Date  . Allergic rhinitis   . GERD (gastroesophageal reflux disease)   . Goiter    ?   Marland Kitchen HTN (hypertension)   . Hx of fibrocystic disease of breast   . Melanoma ([place])   . Migraine   . Numbness  and tingling    bilateral hands  . Osteoarthritis of hand   . PONV (postoperative nausea and vomiting)    Past Surgical History:  Procedure Laterality Date  . ANTERIOR CERVICAL DECOMP/DISCECTOMY FUSION N/A 12/02/2015   Procedure: ANTERIOR CERVICAL DECOMPRESSION/DISCECTOMY FUSION CERVICAL FOUR-FIVE;  Surgeon: Eustace Moore, MD;  Location: [facility];  Service: Neurosurgery;  Laterality: N/A;  . APPENDECTOMY    . BREAST BIOPSY     normal  . CATARACT EXTRACTION Right Jan 2017  . CATARACT EXTRACTION W/ INTRAOCULAR LENS IMPLANT Left 2018  . DEXA  7/07   negative  . EMG     left upper arm- neg  .  ESOPHAGOGASTRODUODENOSCOPY     normal-24 hour PH probe (-)  . EYE SURGERY  10/09   Left eye; for double vision  . PARTIAL HYSTERECTOMY     Ovaries intact  . TUBAL LIGATION     Social History   Tobacco Use  . Smoking status: Never Smoker  . Smokeless tobacco: Never Used  Substance Use Topics  . Alcohol use: No    Alcohol/week: 0.0 standard drinks  . Drug use: No   Family History  Problem Relation Age of Onset  . Breast cancer Mother        Died, 21  . Other Father        Died, 23 - tree fell on him  . Breast cancer Sister        Died, 56  . Heart disease Sister        Died, 30  . Healthy Son   . Healthy Daughter    Allergies  Allergen Reactions  . Sulfonamide Derivatives Anaphylaxis and Other (See Comments)    REACTION: unspecified Hives and swelling   Current Outpatient Medications on File Prior to Visit  Medication Sig Dispense Refill  . Aspirin-Salicylamide-Caffeine 867-61-95 MG TABS Take 1 packet by mouth daily as needed (for headache).    . benzonatate (TESSALON) 100 MG capsule Take 1 capsule (100 mg total) by mouth 3 (three) times daily as needed for cough. 21 capsule 0  . ibuprofen (ADVIL,MOTRIN) 200 MG tablet 200 mg.    . methocarbamol (ROBAXIN) 500 MG tablet TAKE 1 TABLET (500 MG TOTAL) BY MOUTH 3 (THREE) TIMES DAILY AS NEEDED (HEADACHE). 30 tablet 2  . omeprazole (PRILOSEC) 40 MG capsule Take 1 capsule (40 mg total) by mouth daily. 30 capsule 11  . Polyethyl Glycol-Propyl Glycol 0.4-0.3 % SOLN Place 1 drop into both eyes as needed.    . SUMAtriptan (IMITREX) 20 MG/ACT nasal spray USE 1 SPRAY INTO THE NOSE EVERY 2 HOURS AS NEEDED FOR MIGRAINE. NO MORE THAN 2 DOSES IN 24 HOURS 6 each 0  . ALPRAZolam (XANAX) 0.25 MG tablet Take 1 tablet (0.25 mg total) by mouth as needed. Prior to flights 30 tablet 0   No current facility-administered medications on file prior to visit.   Review of Systems  Constitutional: Positive for malaise/fatigue. Negative for chills and  fever.  HENT: Positive for congestion and sinus pain. Negative for ear pain and sore throat.   Eyes: Negative for blurred vision, discharge and redness.  Respiratory: Positive for cough and sputum production. Negative for shortness of breath, wheezing and stridor.   Cardiovascular: Negative for chest pain, palpitations and leg swelling.  Gastrointestinal: Negative for abdominal pain, diarrhea, nausea and vomiting.  Musculoskeletal: Negative for myalgias.  Skin: Negative for rash.  Neurological: Positive for headaches. Negative for dizziness.    Observations/Objective: Patient appears  well, in no distress Weight is baseline  No facial swelling or asymmetry Voice is mildly hoarse Demonstrates tender frontal sinuses  No obvious tremor or mobility impairment Moving neck and UEs normally Able to hear the call well  No cough or shortness of breath during interview  Talkative and mentally sharp with no cognitive changes No skin changes on face or neck , no rash or pallor Affect is somewhat depressed, tearful when discussing her husband     Assessment and Plan: Problem List Items Addressed This Visit      Respiratory   Acute sinusitis - Primary    With 5 d of uri  Home covid test neg, pending pcr and flu tests from UC Suspect viral  Reviewed urgent care records in detail with pt  Will tx severe congestion and facial pain with prednisone taper 30 mg (disc side eff)  Fluids/rest  Salt water gargles and saline nasal irrigation prn  Watch for fever/purulent drainage or worse facial pain Update if not starting to improve in a week or if worsening        Relevant Medications   predniSONE (DELTASONE) 10 MG tablet     Other   Grief reaction    Lost husband in January  Struggling with sleeplessness Reviewed stressors/ coping techniques/symptoms/ support sources/ tx options and side effects in detail today Good support Getting counseling including group work  Good insight  Will try  trazodone 50 mg to help with sleep, side effects discussed  Noted that prednisone may worsen sleep problem            Follow Up Instructions: Take prednisone for sinus inflammation and pain  It may make you jittery and hungry  Drink fluids Try nasal saline spray for congestion  Update me when your test results return and continue to isolate yourself until then  If symptoms worsen also update [place] (if severe, go to the ER)  Try the trazodone for sleep  Take care of yourself  Continue grief counseling  Let us know if no improvement   If the pulsating/ ear sound continues please follow up in the office    I discussed the assessment and treatment plan with the patient. The patient was provided an opportunity to ask questions and all were answered. The patient agreed with the plan and demonstrated an understanding of the instructions.   The patient was advised to call back or seek an in-person evaluation if the symptoms worsen or if the condition fails to improve as anticipated.  I provided 28 minutes of non-face-to-face time during this encounter.   Loura Pardon, MD

## 2020-11-30 NOTE — Patient Instructions (Signed)
Take prednisone for sinus inflammation and pain  It may make you jittery and hungry  Drink fluids Try nasal saline spray for congestion  Update me when your test results return and continue to isolate yourself until then  If symptoms worsen also update [place] (if severe, go to the ER)  Try the trazodone for sleep  Take care of yourself  Continue grief counseling  Let us know if no improvement   If the pulsating/ ear sound continues please follow up in the office

## 2020-11-30 NOTE — Assessment & Plan Note (Signed)
Lost husband in January  Struggling with sleeplessness Reviewed stressors/ coping techniques/symptoms/ support sources/ tx options and side effects in detail today Good support Getting counseling including group work  Good insight  Will try trazodone 50 mg to help with sleep, side effects discussed  Noted that prednisone may worsen sleep problem

## 2020-12-06 ENCOUNTER — Telehealth: Payer: Self-pay | Admitting: Family Medicine

## 2020-12-06 MED ORDER — AMOXICILLIN-POT CLAVULANATE 875-125 MG PO TABS: 1.0000 | ORAL_TABLET | Freq: Two times a day (BID) | ORAL | 0 refills | Status: DC

## 2020-12-06 NOTE — Telephone Encounter (Signed)
I sent augmentin to her pharmacy  Please f/u if not improving If short of breath or more severe symptoms go to the ER

## 2020-12-06 NOTE — Telephone Encounter (Signed)
Agree with that advisement  

## 2020-12-06 NOTE — Telephone Encounter (Signed)
[facility] - Client TELEPHONE ADVICE RECORD [facility] Patient Name: Joann Buchanan Gender: Female DOB: 11/27/1945 Age: 75 Y 58 M 12 D Return Phone Number: [phone] (Primary), [phone] (Secondary) Address: City/ State/ Zip: [place] [address] Client [facility] - Client Client Site [place] MD Contact Type Call Who Is Calling Patient / Member / Family / Caregiver Call Type Triage / Clinical Relationship To Patient Self Return Phone Number [phone] (Primary) Chief Complaint Headache Reason for Call Symptomatic / Request for Health Information Initial Comment Transferred from office no appt for today. PT has congestion, hard time hearing, headaches, facial pain around her cheeks, coughing, no voice, and coughing up a grasih mucus. No fever. Translation No Nurse Assessment Nurse: Alveta Heimlich, RN, Rise Paganini Date/Time (Eastern Time): 12/06/2020 8:58:49 AM Confirm and document reason for call. If symptomatic, describe symptoms. ---Caller states she is having horseness, coughing , facial pain, difficulty hearing, and headache that started friday before last. She saw dr and got prednisone and tessalon perles. She cannot get her head cleared. No fever Does the patient have any new or worsening symptoms? ---Yes Will a triage be completed? ---Yes Related visit to physician within the last 2 weeks? ---Yes Does the PT have any chronic conditions? (i.e. diabetes, asthma, this includes High risk factors for pregnancy, etc.) ---Yes List chronic conditions. ---acid reflux Is this a behavioral health or substance abuse call? ---No Guidelines Guideline Title Affirmed Question Affirmed Notes Nurse Date/Time ([place] Time) COVID-19 - Diagnosed or Suspected [1] HIGH RISK for severe COVID complications (e.g., weak immune system, age > 2 years, obesity with BMI >  25, Alveta Heimlich, RN, Rise Paganini 12/06/2020 9:02:37 AM PLEASE NOTE: All timestamps contained within this report are represented as [place] Standard Time. CONFIDENTIALTY NOTICE: This fax transmission is intended only for the addressee. It contains information that is legally privileged, confidential or otherwise protected from use or disclosure. If you are not the intended recipient, you are strictly prohibited from reviewing, disclosing, copying using or disseminating any of this information or taking any action in reliance on or regarding this information. If you have received this fax in error, please notify us immediately by telephone so that we can arrange for its return to [place]. Phone: [phone], Toll-Free: [phone], Fax: [phone] Page: 2 of 2 Call Id: [id number] Guidelines Guideline Title Affirmed Question Affirmed Notes Nurse Date/Time [name] Time) pregnant, chronic lung disease or other chronic medical condition) AND [2] COVID symptoms (e.g., cough, fever) (Exceptions: Already seen by PCP and no new or worsening symptoms.) Disp. Time [name] Time) Disposition Final User 12/06/2020 9:04:02 AM See HCP within 4 Hours (or PCP triage) Yes Alveta Heimlich, RN, Rise Paganini Disposition Overriden: Call PCP Now Override Reason: Patient's symptoms need a higher level of care Caller Disagree/Comply Comply Caller Understands Yes PreDisposition Call Doctor Care Advice Given Per Guideline SEE HCP (OR PCP TRIAGE) WITHIN 4 HOURS: * IF OFFICE WILL BE OPEN: You need to be seen within the next 3 or 4 hours. Call your doctor (or NP/PA) now or as soon as the office opens. CALL BACK IF: * You become worse CARE ADVICE given per COVID-19 - DIAGNOSED OR SUSPECTED (Adult) guideline. Comments User: Debby Bud, RN Date/Time [name] Time): 12/06/2020 9:05:02 AM She will find an urgent care. Referrals GO TO FACILITY UNDECIDED

## 2020-12-06 NOTE — Telephone Encounter (Signed)
Please see notes below the access nurse note. Pt said she went to UC and was 2 hour wait and pt decided not to wait. Pt had not gotten v/m from Dr Glori Bickers. Pt notified as instructed and pt voiced understanding. Pt said she will go by pharmacy and pick up rx and if condition worsens pt will go to ED. Pt also wanted DR Glori Bickers to know that she still hears pulse in ear and when she feels better will call for appt to see Dr Glori Bickers about that. Sending note to Dr Glori Bickers.

## 2020-12-06 NOTE — Addendum Note (Signed)
Addended by: Loura Pardon A on: 12/06/2020 09:38 AM   Modules accepted: Orders

## 2020-12-06 NOTE — Telephone Encounter (Signed)
Mrs. Perno called in and stated that she doesn't have no voice, and its moving to her chest, and congestion, and when she cough its like a grey color and she hasnt sleep all weekend.  And wanted to know if she can get something else to help. Did send her to access

## 2020-12-06 NOTE — Telephone Encounter (Signed)
Called and left a vm for the pt.

## 2020-12-13 ENCOUNTER — Ambulatory Visit: Payer: Medicare Other | Admitting: Family Medicine

## 2020-12-13 NOTE — Telephone Encounter (Signed)
12:30 is ok

## 2020-12-13 NOTE — Telephone Encounter (Signed)
appt scheduled and back door info given

## 2020-12-13 NOTE — Telephone Encounter (Signed)
Pt called back to say she completed the prednisone and Augmentin. Still having head congestion. Ears are popping. Supposed to go on a cruise this weekend. Thinks she needs another round of antibiotics just to clear it up. Asking for Z-pak and the Augmentin tablets are so large and hard to swallow. She uses [facility]. Please advise at [phone] or [phone]

## 2020-12-13 NOTE — Telephone Encounter (Signed)
Needs in office appt with first available for re assessment before deciding on tx.  She was originally tested covid neg (at [facility])

## 2020-12-13 NOTE — Telephone Encounter (Signed)
No providers have any openings for the rest of the week and pt is leaving this weekend, ? If can add to your 12:30 tomorrow and if so does she need virtual or in office appt, please advise before I call pt

## 2020-12-14 ENCOUNTER — Ambulatory Visit (INDEPENDENT_AMBULATORY_CARE_PROVIDER_SITE_OTHER): Payer: Medicare Other | Admitting: Family Medicine

## 2020-12-14 ENCOUNTER — Other Ambulatory Visit: Payer: Self-pay

## 2020-12-14 ENCOUNTER — Encounter: Payer: Self-pay | Admitting: Family Medicine

## 2020-12-14 VITALS — BP 136/70 | HR 84 | Temp 97.4°F

## 2020-12-14 DIAGNOSIS — J0100 Acute maxillary sinusitis, unspecified: Secondary | ICD-10-CM | POA: Diagnosis not present

## 2020-12-14 DIAGNOSIS — S56911A Strain of unspecified muscles, fascia and tendons at forearm level, right arm, initial encounter: Secondary | ICD-10-CM | POA: Insufficient documentation

## 2020-12-14 MED ORDER — ALPRAZOLAM 0.25 MG PO TABS: 0.2500 mg | ORAL_TABLET | ORAL | 1 refills | Status: DC | PRN

## 2020-12-14 MED ORDER — AZITHROMYCIN 250 MG PO TABS
ORAL_TABLET | ORAL | 0 refills | Status: DC
Start: 2020-12-14 — End: 2021-05-17

## 2020-12-14 MED ORDER — FLUTICASONE PROPIONATE 50 MCG/ACT NA SUSP: 2.0000 | Freq: Every day | NASAL | 6 refills | Status: DC

## 2020-12-14 NOTE — Progress Notes (Signed)
Subjective:    Patient ID: Joann Buchanan, female    DOB: 1945/08/20, 75 y.o.   MRN: [id number]  This visit occurred during the SARS-CoV-2 public health emergency.  Safety protocols were in place, including screening questions prior to the visit, additional usage of staff PPE, and extensive cleaning of exam room while observing appropriate contact time as indicated for disinfecting solutions.    HPI Pt presents with ear problems and congested Arm injury-mentions at end of visit   Was recently tx for acute sinusitis after uri  She was originally seen at [facility] (note reviewed) and then by [place]   Did home covid test and it was negative as well  Treated with prednisone  Then abx added augmentin   Better than what she was  Still feels like she is in a barrel -ears were clogged and now popping   Some mix of pressure and discomfort in cheeks  Less L sided discomfort than prior  Nose feels stuffy but does not get anything out  Every now and then ear pain   Started taking sudafed   She did use nasal saline   Mentions at end of visit-arm injury  Right arm is sore=forearm after tearing up carpet  Some mild swelling Is mildly tender Resting it   Patient Active Problem List   Diagnosis Date Noted  . Grief reaction 11/30/2020  . Prediabetes 05/09/2018  . Chest pain 08/13/2017  . Encounter for screening mammogram for breast cancer 10/30/2016  . S/P cervical spinal fusion 12/02/2015  . Osteopenia 10/25/2015  . Estrogen deficiency 10/05/2015  . Routine general medical examination at a health care facility 09/25/2015  . Acute sinusitis 07/06/2015  . Caregiver stress 04/06/2015  . Hand tingling 04/05/2015  . Encounter for Medicare annual wellness exam 03/03/2013  . OSTEOARTHRITIS, HANDS, BILATERAL 11/01/2009  . Menopausal symptoms 03/23/2009  . History of melanoma 02/04/2007  . THYROMEGALY 02/04/2007  . Migraine 02/04/2007  . Essential hypertension 02/04/2007  . ALLERGIC RHINITIS  02/04/2007  . GERD 02/04/2007   Past Medical History:  Diagnosis Date  . Allergic rhinitis   . GERD (gastroesophageal reflux disease)   . Goiter    ?   Marland Kitchen HTN (hypertension)   . Hx of fibrocystic disease of breast   . Melanoma ([place])   . Migraine   . Numbness and tingling    bilateral hands  . Osteoarthritis of hand   . PONV (postoperative nausea and vomiting)    Past Surgical History:  Procedure Laterality Date  . ANTERIOR CERVICAL DECOMP/DISCECTOMY FUSION N/A 12/02/2015   Procedure: ANTERIOR CERVICAL DECOMPRESSION/DISCECTOMY FUSION CERVICAL FOUR-FIVE;  Surgeon: Eustace Moore, MD;  Location: [place] [facility];  Service: Neurosurgery;  Laterality: N/A;  . APPENDECTOMY    . BREAST BIOPSY     normal  . CATARACT EXTRACTION Right Jan 2017  . CATARACT EXTRACTION W/ INTRAOCULAR LENS IMPLANT Left 2018  . DEXA  7/07   negative  . EMG     left upper arm- neg  . ESOPHAGOGASTRODUODENOSCOPY     normal-24 hour PH probe (-)  . EYE SURGERY  10/09   Left eye; for double vision  . PARTIAL HYSTERECTOMY     Ovaries intact  . TUBAL LIGATION     Social History   Tobacco Use  . Smoking status: Never Smoker  . Smokeless tobacco: Never Used  Substance Use Topics  . Alcohol use: No    Alcohol/week: 0.0 standard drinks  . Drug use: No  Family History  Problem Relation Age of Onset  . Breast cancer Mother        Died, 92  . Other Father        Died, 6 - tree fell on him  . Breast cancer Sister        Died, 15  . Heart disease Sister        Died, 11  . Healthy Son   . Healthy Daughter    Allergies  Allergen Reactions  . Sulfonamide Derivatives Anaphylaxis and Other (See Comments)    REACTION: unspecified Hives and swelling   Current Outpatient Medications on File Prior to Visit  Medication Sig Dispense Refill  . Aspirin-Salicylamide-Caffeine 062-69-48 MG TABS Take 1 packet by mouth daily as needed (for headache).    . benzonatate (TESSALON) 100 MG capsule Take 1 capsule (100 mg  total) by mouth 3 (three) times daily as needed for cough. 21 capsule 0  . ibuprofen (ADVIL,MOTRIN) 200 MG tablet 200 mg.    . methocarbamol (ROBAXIN) 500 MG tablet TAKE 1 TABLET (500 MG TOTAL) BY MOUTH 3 (THREE) TIMES DAILY AS NEEDED (HEADACHE). 30 tablet 2  . omeprazole (PRILOSEC) 40 MG capsule Take 1 capsule (40 mg total) by mouth daily. 30 capsule 11  . Polyethyl Glycol-Propyl Glycol 0.4-0.3 % SOLN Place 1 drop into both eyes as needed.    . SUMAtriptan (IMITREX) 20 MG/ACT nasal spray USE 1 SPRAY INTO THE NOSE EVERY 2 HOURS AS NEEDED FOR MIGRAINE. NO MORE THAN 2 DOSES IN 24 HOURS 6 each 0  . traZODone (DESYREL) 50 MG tablet Take 0.5-1 tablets (25-50 mg total) by mouth at bedtime as needed for sleep. 30 tablet 3   No current facility-administered medications on file prior to visit.    Review of Systems  Constitutional: Negative for activity change, appetite change, fatigue, fever and unexpected weight change.  HENT: Positive for congestion, ear pain, postnasal drip and sinus pressure. Negative for facial swelling, hearing loss, nosebleeds, rhinorrhea and sore throat.   Eyes: Negative for pain, redness and visual disturbance.  Respiratory: Negative for cough, shortness of breath and wheezing.   Cardiovascular: Negative for chest pain and palpitations.  Gastrointestinal: Negative for abdominal pain, blood in stool, constipation and diarrhea.  Endocrine: Negative for polydipsia and polyuria.  Genitourinary: Negative for dysuria, frequency and urgency.  Musculoskeletal: Negative for arthralgias, back pain and myalgias.  Skin: Negative for pallor and rash.  Allergic/Immunologic: Negative for environmental allergies.  Neurological: Negative for dizziness, syncope and headaches.  Hematological: Negative for adenopathy. Does not bruise/bleed easily.  Psychiatric/Behavioral: Negative for decreased concentration and dysphoric mood. The patient is not nervous/anxious.        Objective:    Physical Exam Constitutional:      General: She is not in acute distress.    Appearance: Normal appearance. She is well-developed and normal weight. She is not ill-appearing.  HENT:     Head: Normocephalic and atraumatic.     Comments: Nares are injected and congested  Clear pnd   Bilateral maxillary sinus tenderness     Right Ear: Ear canal and external ear normal.     Left Ear: Ear canal and external ear normal.     Ears:     Comments: Bilateral TMs are dull  No erythema or bulging    Nose: Congestion and rhinorrhea present.     Mouth/Throat:     Mouth: Mucous membranes are moist.     Pharynx: No oropharyngeal exudate or posterior oropharyngeal  erythema.     Comments: Clear pnd Eyes:     General: No scleral icterus.       Right eye: No discharge.        Left eye: No discharge.     Conjunctiva/sclera: Conjunctivae normal.     Pupils: Pupils are equal, round, and reactive to light.  Cardiovascular:     Rate and Rhythm: Normal rate and regular rhythm.  Pulmonary:     Effort: Pulmonary effort is normal. No respiratory distress.     Breath sounds: Normal breath sounds. No stridor. No wheezing, rhonchi or rales.     Comments: Good air exch Musculoskeletal:     Cervical back: Normal range of motion and neck supple. No tenderness.     Comments: Some mild tenderness of R forearm / mildly swollen w/o redness   Lymphadenopathy:     Cervical: No cervical adenopathy.  Skin:    General: Skin is warm and dry.     Findings: No rash.  Neurological:     Mental Status: She is alert.     Cranial Nerves: No cranial nerve deficit.  Psychiatric:        Mood and Affect: Mood normal.           Assessment & Plan:   Problem List Items Addressed This Visit      Respiratory   Acute sinusitis - Primary    Improved after a course of prednisone and augmentin  Now symptoms of ETD and milder sinus pain  Px flonase nasal spray to use daily  Also zpak (at pt req since augmentin pills were  hard to swallow) Nasal saline/steam prn  Update if not starting to improve in a week or if worsening        Relevant Medications   azithromycin (ZITHROMAX Z-PAK) 250 MG tablet   fluticasone (FLONASE) 50 MCG/ACT nasal spray     Musculoskeletal and Integument   Strain of forearm, right    After pulling up carpet  Appears puffy and mild tenderness over anterior forearm  Adv relative rest and ice Could try voltaren gel  Update if not starting to improve in a week or if worsening

## 2020-12-14 NOTE — Assessment & Plan Note (Signed)
After pulling up carpet  Appears puffy and mild tenderness over anterior forearm  Adv relative rest and ice Could try voltaren gel  Update if not starting to improve in a week or if worsening

## 2020-12-14 NOTE — Assessment & Plan Note (Addendum)
Improved after a course of prednisone and augmentin  Now symptoms of ETD and milder sinus pain  Px flonase nasal spray to use daily  Also zpak (at pt req since augmentin pills were hard to swallow) Nasal saline/steam prn  Update if not starting to improve in a week or if worsening

## 2020-12-14 NOTE — Addendum Note (Signed)
Addended by: Loura Pardon A on: 12/14/2020 02:31 PM   Modules accepted: Orders

## 2020-12-14 NOTE — Patient Instructions (Addendum)
Drink lots of fluids Use flonase nasal spray as directed  Take the zpack   Update if not starting to improve in a week or if worsening    Ice may help your arm   Use xanax with caution for travel if you need something   Take care of yourself

## 2021-03-19 ENCOUNTER — Other Ambulatory Visit: Payer: Self-pay | Admitting: Family Medicine

## 2021-03-21 NOTE — Telephone Encounter (Signed)
Last refilled on 11/30/20 #30 with 3 refills LOV 12/14/20 Sinusitis  Last AWV and CPE 03/31/2019

## 2021-05-17 ENCOUNTER — Telehealth (INDEPENDENT_AMBULATORY_CARE_PROVIDER_SITE_OTHER): Payer: Medicare Other | Admitting: Family Medicine

## 2021-05-17 ENCOUNTER — Encounter: Payer: Self-pay | Admitting: Family Medicine

## 2021-05-17 ENCOUNTER — Other Ambulatory Visit: Payer: Self-pay

## 2021-05-17 DIAGNOSIS — J0140 Acute pansinusitis, unspecified: Secondary | ICD-10-CM | POA: Diagnosis not present

## 2021-05-17 MED ORDER — AMOXICILLIN-POT CLAVULANATE 875-125 MG PO TABS: 1.0000 | ORAL_TABLET | Freq: Two times a day (BID) | ORAL | 0 refills | Status: AC

## 2021-05-17 NOTE — Progress Notes (Signed)
    I connected with Joann Buchanan on 05/17/21 at 10:20 AM EST by video and verified that I am speaking with the correct person using two identifiers.   I discussed the limitations, risks, security and privacy concerns of performing an evaluation and management service by video and the availability of in person appointments. I also discussed with the patient that there may be a patient responsible charge related to this service. The patient expressed understanding and agreed to proceed.  Patient location: Home Provider Location: [place] Participants: Lesleigh Noe and Joann Buchanan   Subjective:     Joann Buchanan is a 75 y.o. female presenting for Nasal Congestion (With green mucous ), Sinusitis, Headache, Sore Throat ("Scratchy" after working in yard and burning leaves), and Cough     Sinus Problem This is a new problem. The current episode started in the past 7 days. Progression since onset: was getting better and then symptoms returned. There has been no fever. The pain is mild. Associated symptoms include chills (x1 day), congestion, coughing, headaches, sinus pressure and a sore throat. Treatments tried: flonase.   Does not do saline rinse Does have some saline spray Taking sudafed and motrin  Review of Systems  Constitutional:  Positive for chills (x1 day). Negative for fever.  HENT:  Positive for congestion, sinus pressure, sinus pain and sore throat.   Respiratory:  Positive for cough.   Neurological:  Positive for headaches.    Social History   Tobacco Use  Smoking Status Never  Smokeless Tobacco Never        Objective:   BP Readings from Last 3 Encounters:  12/14/20 136/70  11/29/20 131/87  03/31/19 128/78   Wt Readings from Last 3 Encounters:  03/31/19 138 lb (62.6 kg)  10/16/17 136 lb 8 oz (61.9 kg)  10/10/17 138 lb 12 oz (62.9 kg)    There were no vitals taken for this visit.  Physical Exam Constitutional:      Appearance: Normal  appearance. She is not ill-appearing.     Comments: Tired appearing  HENT:     Head: Normocephalic and atraumatic.     Right Ear: External ear normal.     Left Ear: External ear normal.  Eyes:     Conjunctiva/sclera: Conjunctivae normal.  Pulmonary:     Effort: Pulmonary effort is normal. No respiratory distress.  Neurological:     Mental Status: She is alert. Mental status is at baseline.  Psychiatric:        Mood and Affect: Mood normal.        Behavior: Behavior normal.        Thought Content: Thought content normal.        Judgment: Judgment normal.           Assessment & Plan:   Problem List Items Addressed This Visit       Respiratory   Acute sinusitis - Primary   Relevant Medications   amoxicillin-clavulanate (AUGMENTIN) 875-125 MG tablet    Suspect triggered by seasonal allergies but persisting Abx for infection Advised continued flonase Add allergy pill and consider neti pot Call if not improving and consider in-person visit No follow-ups on file.  Lesleigh Noe, MD

## 2021-06-05 ENCOUNTER — Other Ambulatory Visit: Payer: Self-pay | Admitting: Family Medicine

## 2021-06-06 ENCOUNTER — Other Ambulatory Visit: Payer: Self-pay | Admitting: Family Medicine

## 2021-06-06 NOTE — Telephone Encounter (Signed)
Pt has had multiple recent acute appts but no recent f/u or CPE and last CPE was in 2020.  Last filled on 04/01/20 #6 each with 0 refill

## 2021-06-06 NOTE — Telephone Encounter (Signed)
Med refill

## 2021-06-06 NOTE — Telephone Encounter (Signed)
I sent message to Dr. Glori Bickers and per Dr. Glori Bickers:  Please schedule PE in late winter and refill until then   **please schedule appt and then route back to me to refill**

## 2021-06-06 NOTE — Telephone Encounter (Signed)
Pt scheduled cpe/lab in January back at [facility]

## 2021-06-07 NOTE — Telephone Encounter (Signed)
CPE on 08/01/20, last filled on 01/26/20 #30 tabs with 2 refills

## 2021-06-28 ENCOUNTER — Other Ambulatory Visit: Payer: Self-pay

## 2021-06-28 ENCOUNTER — Telehealth: Payer: Self-pay | Admitting: Family Medicine

## 2021-06-28 ENCOUNTER — Encounter: Payer: Self-pay | Admitting: Family Medicine

## 2021-06-28 ENCOUNTER — Telehealth (INDEPENDENT_AMBULATORY_CARE_PROVIDER_SITE_OTHER): Payer: Medicare Other | Admitting: Family Medicine

## 2021-06-28 VITALS — BP 139/90 | HR 92 | Temp 98.1°F

## 2021-06-28 DIAGNOSIS — J0100 Acute maxillary sinusitis, unspecified: Secondary | ICD-10-CM | POA: Diagnosis not present

## 2021-06-28 DIAGNOSIS — J040 Acute laryngitis: Secondary | ICD-10-CM | POA: Insufficient documentation

## 2021-06-28 MED ORDER — PREDNISONE 10 MG PO TABS: ORAL_TABLET | ORAL | 0 refills | Status: DC

## 2021-06-28 MED ORDER — AZITHROMYCIN 250 MG PO TABS: ORAL_TABLET | ORAL | 0 refills | Status: DC

## 2021-06-28 NOTE — Telephone Encounter (Signed)
Joann Buchanan called in and stated that she took a covid test and it was negative. And wanted to know about what to do.

## 2021-06-28 NOTE — Telephone Encounter (Signed)
Pt notified of Dr. Tower's comments and verbalized understanding  

## 2021-06-28 NOTE — Patient Instructions (Signed)
Do a covid test and call us with the result  If it is positive we will start anti viral medicine   Take prednisone and zpak for sinus infection   I think the hoarse voice is from a new virus Rest your voice  Drink fluids  Take medicine for symptoms  Watch for fever or worse/new symptoms and keep [place] posted   Update if not starting to improve in a week or if worsening

## 2021-06-28 NOTE — Assessment & Plan Note (Signed)
Acute on chronic -from early nov Sinus pain , green nasal mucous and also ear pain  She says augmentin usually does not work for her   Px zpak and also prednisone  Update if not starting to improve in a week or if worsening  (may need in office visit) ER precautions discussed

## 2021-06-28 NOTE — Assessment & Plan Note (Signed)
Suspect viral  Also getting over a sinus infection (taking zpak and prednisone for that)  Advised voice rest and fluids  Will take a home covid test and call us with results  Watch for cough/sob/fever

## 2021-06-28 NOTE — Progress Notes (Signed)
Virtual Visit via Video Note  I connected with Joann Buchanan on 06/28/21 at 11:00 AM EST by a video enabled telemedicine application and verified that I am speaking with the correct person using two identifiers.  Location: Patient: home Provider: office   I discussed the limitations of evaluation and management by telemedicine and the availability of in person appointments. The patient expressed understanding and agreed to proceed.  Parties involved in encounter  Patient: Joann Buchanan   Provider:  Loura Pardon MD   History of Present Illness: Pt presents with hoarseness and sinus problems  Very hoarse  Had a sinus infection early November -never got 100% after that  Augmentin does not work that well for her in the past (would rather not use it)  The congestion never cleared 100%  Does not feel terribly bad  Runny nose  Then congestion -bad with green d/c  Has a bad headache  L ear hurts also   No cough at this point   She does not have a covid test at home   No fever/ temp of 98.1 -no body aches or chills  Bp is up a little high -? Unsure if cuff is accurate     OTC: Flonase  Has used mucinex , it dries her out  Uses saline ns   Did use imitrex for her headache/migraine  Imm status     Patient Active Problem List   Diagnosis Date Noted   Strain of forearm, right 12/14/2020   Grief reaction 11/30/2020   Prediabetes 05/09/2018   Chest pain 08/13/2017   Encounter for screening mammogram for breast cancer 10/30/2016   S/P cervical spinal fusion 12/02/2015   Osteopenia 10/25/2015   Estrogen deficiency 10/05/2015   Routine general medical examination at a health care facility 09/25/2015   Acute sinusitis 07/06/2015   Caregiver stress 04/06/2015   Hand tingling 04/05/2015   Encounter for Medicare annual wellness exam 03/03/2013   OSTEOARTHRITIS, HANDS, BILATERAL 11/01/2009   Menopausal symptoms 03/23/2009   History of melanoma 02/04/2007   THYROMEGALY  02/04/2007   Migraine 02/04/2007   Essential hypertension 02/04/2007   ALLERGIC RHINITIS 02/04/2007   GERD 02/04/2007   Past Medical History:  Diagnosis Date   Allergic rhinitis    GERD (gastroesophageal reflux disease)    Goiter    ?    HTN (hypertension)    Hx of fibrocystic disease of breast    Melanoma ([place])    Migraine    Numbness and tingling    bilateral hands   Osteoarthritis of hand    PONV (postoperative nausea and vomiting)    Past Surgical History:  Procedure Laterality Date   ANTERIOR CERVICAL DECOMP/DISCECTOMY FUSION N/A 12/02/2015   Procedure: ANTERIOR CERVICAL DECOMPRESSION/DISCECTOMY FUSION CERVICAL FOUR-FIVE;  Surgeon: Eustace Moore, MD;  Location: [place];  Service: Neurosurgery;  Laterality: N/A;   APPENDECTOMY     BREAST BIOPSY     normal   CATARACT EXTRACTION Right Jan 2017   CATARACT EXTRACTION W/ INTRAOCULAR LENS IMPLANT Left 2018   DEXA  7/07   negative   EMG     left upper arm- neg   ESOPHAGOGASTRODUODENOSCOPY     normal-24 hour PH probe (-)   EYE SURGERY  10/09   Left eye; for double vision   PARTIAL HYSTERECTOMY     Ovaries intact   TUBAL LIGATION     Social History   Tobacco Use   Smoking status: Never   Smokeless tobacco: Never  Substance Use Topics   Alcohol use: No    Alcohol/week: 0.0 standard drinks   Drug use: No   Family History  Problem Relation Age of Onset   Breast cancer Mother        Died, 77   Other Father        Died, 78 - tree fell on him   Breast cancer Sister        Died, 68   Heart disease Sister        Died, 17   Healthy Son    Healthy Daughter    Allergies  Allergen Reactions   Sulfonamide Derivatives Anaphylaxis and Other (See Comments)    REACTION: unspecified Hives and swelling   Current Outpatient Medications on File Prior to Visit  Medication Sig Dispense Refill   ALPRAZolam (XANAX) 0.25 MG tablet Take 1 tablet (0.25 mg total) by mouth as needed. Prior to flights 10 tablet 1    Aspirin-Salicylamide-Caffeine 478-29-56 MG TABS Take 1 packet by mouth daily as needed (for headache).     fluticasone (FLONASE) 50 MCG/ACT nasal spray Place 2 sprays into both nostrils daily. 16 g 6   ibuprofen (ADVIL,MOTRIN) 200 MG tablet 200 mg.     methocarbamol (ROBAXIN) 500 MG tablet TAKE 1 TABLET (500 MG TOTAL) BY MOUTH 3 (THREE) TIMES DAILY AS NEEDED (HEADACHE). 30 tablet 2   omeprazole (PRILOSEC) 40 MG capsule Take 1 capsule (40 mg total) by mouth daily. 30 capsule 11   Polyethyl Glycol-Propyl Glycol 0.4-0.3 % SOLN Place 1 drop into both eyes as needed.     SUMAtriptan (IMITREX) 20 MG/ACT nasal spray USE 1 SPRAY INTO THE NOSE EVERY 2 HOURS AS NEEDED FOR MIGRAINE. NO MORE THAN 2 DOSES IN 24 HOURS 6 each 1   traZODone (DESYREL) 50 MG tablet TAKE 0.5-1 TABLETS BY MOUTH AT BEDTIME AS NEEDED FOR SLEEP. 30 tablet 3   No current facility-administered medications on file prior to visit.   Review of Systems  Constitutional:  Negative for chills, fever and malaise/fatigue.  HENT:  Positive for congestion, ear pain and sinus pain. Negative for sore throat.   Eyes:  Negative for blurred vision, discharge and redness.  Respiratory:  Negative for cough, sputum production, shortness of breath, wheezing and stridor.   Cardiovascular:  Negative for chest pain, palpitations and leg swelling.  Gastrointestinal:  Negative for abdominal pain, diarrhea, nausea and vomiting.  Musculoskeletal:  Negative for myalgias.  Skin:  Negative for rash.  Neurological:  Positive for headaches. Negative for dizziness.   Observations/Objective: Patient appears well, in no distress Weight is baseline  No facial swelling or asymmetry Very hoarse voice, also sounds congested  No obvious tremor or mobility impairment Moving neck and UEs normally Able to hear the call well  No cough or shortness of breath during interview  Talkative and mentally sharp with no cognitive changes No skin changes on face or neck , no rash  or pallor Affect is normal    Assessment and Plan: Problem List Items Addressed This Visit       Respiratory   Acute sinusitis    Acute on chronic -from early nov Sinus pain , green nasal mucous and also ear pain  She says augmentin usually does not work for her   Px zpak and also prednisone  Update if not starting to improve in a week or if worsening  (may need in office visit) ER precautions discussed       Relevant Medications  azithromycin (ZITHROMAX Z-PAK) 250 MG tablet   predniSONE (DELTASONE) 10 MG tablet   Laryngitis - Primary    Suspect viral  Also getting over a sinus infection (taking zpak and prednisone for that)  Advised voice rest and fluids  Will take a home covid test and call us with results  Watch for cough/sob/fever          Follow Up Instructions: Do a covid test and call us with the result  If it is positive we will start anti viral medicine   Take prednisone and zpak for sinus infection   I think the hoarse voice is from a new virus Rest your voice  Drink fluids  Take medicine for symptoms  Watch for fever or worse/new symptoms and keep [place] posted   Update if not starting to improve in a week or if worsening    I discussed the assessment and treatment plan with the patient. The patient was provided an opportunity to ask questions and all were answered. The patient agreed with the plan and demonstrated an understanding of the instructions.   The patient was advised to call back or seek an in-person evaluation if the symptoms worsen or if the condition fails to improve as anticipated.     Loura Pardon, MD

## 2021-06-28 NOTE — Telephone Encounter (Signed)
Great, that is reassuring  Rest, drink fluids and rest your voice Take the zpak and prednisone Update if not starting to improve in a week or if worsening

## 2021-07-15 ENCOUNTER — Other Ambulatory Visit: Payer: Self-pay | Admitting: Family Medicine

## 2021-07-18 NOTE — Telephone Encounter (Signed)
Refill request trazodone Last refill 03/21/21 #30/2 Last office visit 06/28/21 video

## 2021-07-25 ENCOUNTER — Other Ambulatory Visit: Payer: Medicare Other

## 2021-08-01 ENCOUNTER — Encounter: Payer: Medicare Other | Admitting: Family Medicine

## 2021-08-24 NOTE — Progress Notes (Signed)
Subjective:   Joann Buchanan is a 76 y.o. female who presents for Medicare Annual (Subsequent) preventive examination.  Review of Systems     Cardiac Risk Factors include: advanced age (>37men, >74 women);hypertension     Objective:    Today's Vitals   08/25/21 1308  BP: 128/88  Pulse: 71  SpO2: 99%  Weight: 118 lb 3.2 oz (53.6 kg)  Height: 5' (1.524 m)   Body mass index is 23.08 kg/m.  Advanced Directives 08/25/2021 03/24/2019 10/10/2017 08/13/2017 08/12/2017 10/09/2016 06/06/2016  Does Patient Have a Medical Advance Directive? No No Yes No No No No  Type of Advance Directive - - Living will - - - -  Would patient like information on creating a medical advance directive? Yes (MAU/Ambulatory/Procedural Areas - Information given) No - Patient declined - No - Patient declined - - No - Patient declined    Current Medications (verified) Outpatient Encounter Medications as of 08/25/2021  Medication Sig   ALPRAZolam (XANAX) 0.25 MG tablet Take 1 tablet (0.25 mg total) by mouth as needed. Prior to flights   fluticasone (FLONASE) 50 MCG/ACT nasal spray Place 2 sprays into both nostrils daily.   ibuprofen (ADVIL,MOTRIN) 200 MG tablet 200 mg.   methocarbamol (ROBAXIN) 500 MG tablet TAKE 1 TABLET (500 MG TOTAL) BY MOUTH 3 (THREE) TIMES DAILY AS NEEDED (HEADACHE).   omeprazole (PRILOSEC) 40 MG capsule Take 1 capsule (40 mg total) by mouth daily.   SUMAtriptan (IMITREX) 20 MG/ACT nasal spray USE 1 SPRAY INTO THE NOSE EVERY 2 HOURS AS NEEDED FOR MIGRAINE. NO MORE THAN 2 DOSES IN 24 HOURS   traZODone (DESYREL) 50 MG tablet TAKE 1/2 TO 1 TABLET BY MOUTH AT BEDTIME AS NEEDED FOR SLEEP   Aspirin-Salicylamide-Caffeine 573-22-02 MG TABS Take 1 packet by mouth daily as needed (for headache). (Patient not taking: Reported on 08/25/2021)   Polyethyl Glycol-Propyl Glycol 0.4-0.3 % SOLN Place 1 drop into both eyes as needed.   predniSONE (DELTASONE) 10 MG tablet Take 4 pills once daily by mouth for 3 days, then  3 pills daily for 3 days, then 2 pills daily for 3 days then 1 pill daily for 3 days then stop (Patient not taking: Reported on 08/25/2021)   [DISCONTINUED] azithromycin (ZITHROMAX Z-PAK) 250 MG tablet Take 2 pills by mouth today and then 1 pill daily for 4 days   No facility-administered encounter medications on file as of 08/25/2021.    Allergies (verified) Sulfonamide derivatives   History: Past Medical History:  Diagnosis Date   Allergic rhinitis    GERD (gastroesophageal reflux disease)    Goiter    ?    HTN (hypertension)    Hx of fibrocystic disease of breast    Melanoma ([place])    Migraine    Numbness and tingling    bilateral hands   Osteoarthritis of hand    PONV (postoperative nausea and vomiting)    Past Surgical History:  Procedure Laterality Date   ANTERIOR CERVICAL DECOMP/DISCECTOMY FUSION N/A 12/02/2015   Procedure: ANTERIOR CERVICAL DECOMPRESSION/DISCECTOMY FUSION CERVICAL FOUR-FIVE;  Surgeon: Eustace Moore, MD;  Location: [facility];  Service: Neurosurgery;  Laterality: N/A;   APPENDECTOMY     BREAST BIOPSY     normal   CATARACT EXTRACTION Right Jan 2017   CATARACT EXTRACTION W/ INTRAOCULAR LENS IMPLANT Left 2018   DEXA  7/07   negative   EMG     left upper arm- neg   ESOPHAGOGASTRODUODENOSCOPY     normal-24  hour PH probe (-)   EYE SURGERY  10/09   Left eye; for double vision   PARTIAL HYSTERECTOMY     Ovaries intact   TUBAL LIGATION     Family History  Problem Relation Age of Onset   Breast cancer Mother        Died, 63   Other Father        Died, 21 - tree fell on him   Breast cancer Sister        Died, 37   Heart disease Sister        Died, 58   Healthy Son    Healthy Daughter    Social History   Socioeconomic History   Marital status: Widowed    Spouse name: Not on file   Number of children: 2   Years of education: Not on file   Highest education level: Not on file  Occupational History   Occupation: Retired    Fish farm manager:  UNEMPLOYED  Tobacco Use   Smoking status: Never   Smokeless tobacco: Never  Substance and Sexual Activity   Alcohol use: No    Alcohol/week: 0.0 standard drinks   Drug use: No   Sexual activity: Never  Other Topics Concern   Not on file  Social History Narrative   Lives with husband.  Retired Water quality scientist for [employer]   Married; 2 children; 3 grand children      Social Determinants of Health   Financial Resource Strain: Low Risk    Difficulty of Paying Living Expenses: Not hard at all  Food Insecurity: No Food Insecurity   Worried About Charity fundraiser in the Last Year: Never true   Arboriculturist in the Last Year: Never true  Transportation Needs: No Transportation Needs   Lack of Transportation (Medical): No   Lack of Transportation (Non-Medical): No  Physical Activity: Inactive   Days of Exercise per Week: 0 days   Minutes of Exercise per Session: 0 min  Stress: No Stress Concern Present   Feeling of Stress : Not at all  Social Connections: Socially Integrated   Frequency of Communication with Friends and Family: More than three times a week   Frequency of Social Gatherings with Friends and Family: More than three times a week   Attends Religious Services: More than 4 times per year   Active Member of [employer] or Organizations: Yes   Attends Music therapist: More than 4 times per year   Marital Status: Married    Tobacco Counseling Counseling given: Not Answered   Clinical Intake:  Pre-visit preparation completed: Yes  Pain : No/denies pain     BMI - recorded: 23.08 Nutritional Status: BMI of 19-24  Normal Nutritional Risks: Other (Comment) (Lost appetite due to loss of spouse) Diabetes: No  How often do you need to have someone help you when you read instructions, pamphlets, or other written materials from your doctor or pharmacy?: 1 - Never  Diabetic? No  Interpreter Needed?: No  Information entered by :: Orrin Brigham  LPN   Activities of Daily Living In your present state of health, do you have any difficulty performing the following activities: 08/25/2021  Hearing? N  Vision? N  Difficulty concentrating or making decisions? N  Walking or climbing stairs? N  Dressing or bathing? N  Doing errands, shopping? N  Preparing Food and eating ? N  Using the Toilet? N  In the past six months, have you accidently  leaked urine? N  Do you have problems with loss of bowel control? N  Managing your Medications? N  Managing your Finances? N  Housekeeping or managing your Housekeeping? N  Some recent data might be hidden    Patient Care Team: Tower, Wynelle Fanny, MD as PCP - General Marilynne Halsted, MD as Consulting Physician (Ophthalmology) Minna Merritts, MD as Consulting Physician (Cardiology) Eustace Moore, MD as Consulting Physician (Neurosurgery) Alda Berthold, DO as Consulting Physician (Neurology)  Indicate any recent Medical Services you may have received from other than [facility] providers in the past year (date may be approximate).     Assessment:   This is a routine wellness examination for Aron.  Hearing/Vision screen Hearing Screening   500Hz  1000Hz  2000Hz  4000Hz   Right ear 40  40 40  Left ear 40 40 40 40  Comments: No issues   Vision Screening   Right eye Left eye Both eyes  Without correction 20/40 20/50 20/30   With correction       Dietary issues and exercise activities discussed: Current Exercise Habits: The patient does not participate in regular exercise at present   Goals Addressed             This Visit's Progress    Patient Stated       Would like to drink more water, have a better eating routine and exercise more       Depression Screen PHQ 2/9 Scores 08/25/2021 03/24/2019 10/10/2017 10/09/2016 10/05/2015 10/05/2015 03/03/2013  PHQ - 2 Score 0 0 1 0 0 0 0  PHQ- 9 Score - 1 1 - - - -    Fall Risk Fall Risk  08/25/2021 03/24/2019 10/10/2017 10/09/2016 06/28/2016  Falls  in the past year? 0 0 No No No  Number falls in past yr: 0 0 - - -  Injury with Fall? 0 - - - -  Risk for fall due to : No Fall Risks - - - -  Follow up Falls prevention discussed Falls evaluation completed;Falls prevention discussed - - -    FALL RISK PREVENTION PERTAINING TO THE HOME:  Any stairs in or around the home? No  If so, are there any without handrails? No  Home free of loose throw rugs in walkways, pet beds, electrical cords, etc? Yes  Adequate lighting in your home to reduce risk of falls? Yes   ASSISTIVE DEVICES UTILIZED TO PREVENT FALLS:  Life alert? No  Use of a cane, walker or w/c? No  Grab bars in the bathroom? No  Shower chair or bench in shower? Yes  Elevated toilet seat or a handicapped toilet? No   TIMED UP AND GO:  Was the test performed? Yes .  Length of time to ambulate 10 feet: 10 sec.   Gait steady and fast without use of assistive device  Cognitive Function: Normal cognitive status assessed by direct observation by this Nurse Health Advisor. No abnormalities found.   MMSE - Mini Mental State Exam 03/24/2019 10/10/2017 10/09/2016 10/05/2015  Orientation to time 5 5 5 5   Orientation to Place 5 5 5 5   Registration 3 3 3 3   Attention/ Calculation 5 0 0 0  Recall 3 3 3 3   Language- name 2 objects - 0 0 0  Language- repeat 1 1 1 1   Language- follow 3 step command - 3 3 3   Language- read & follow direction - 0 0 0  Write a sentence - 0 0 0  Copy design -  0 0 0  Total score - 20 20 20         Immunizations Immunization History  Administered Date(s) Administered   Pneumococcal Conjugate-13 10/16/2017   Td 05/08/2004   Zoster, Live 03/23/2009    TDAP status: Due, Education has been provided regarding the importance of this vaccine. Advised may receive this vaccine at local pharmacy or Health Dept. Aware to provide a copy of the vaccination record if obtained from local pharmacy or Health Dept. Verbalized acceptance and understanding.  Flu Vaccine  status: Declined, Education has been provided regarding the importance of this vaccine but patient still declined. Advised may receive this vaccine at local pharmacy or Health Dept. Aware to provide a copy of the vaccination record if obtained from local pharmacy or Health Dept. Verbalized acceptance and understanding.  Pneumococcal vaccine status: Declined,  Education has been provided regarding the importance of this vaccine but patient still declined. Advised may receive this vaccine at local pharmacy or Health Dept. Aware to provide a copy of the vaccination record if obtained from local pharmacy or Health Dept. Verbalized acceptance and understanding.   Covid-19 vaccine status: Information provided on how to obtain vaccines.   Qualifies for Shingles Vaccine? Yes   Zostavax completed Yes   Shingrix Completed?: No.    Education has been provided regarding the importance of this vaccine. Patient has been advised to call insurance company to determine out of pocket expense if they have not yet received this vaccine. Advised may also receive vaccine at local pharmacy or Health Dept. Verbalized acceptance and understanding.  Screening Tests Health Maintenance  Topic Date Due   COVID-19 Vaccine (1) Never done   Fecal DNA (Cologuard)  Never done   Zoster Vaccines- Shingrix (1 of 2) Never done   MAMMOGRAM  04/16/2018   Pneumonia Vaccine 25+ Years old (2 - PPSV23 if available, else PCV20) 10/17/2018   INFLUENZA VACCINE  Never done   TETANUS/TDAP  05/07/2024 (Originally 05/08/2014)   DEXA SCAN  Completed   Hepatitis C Screening  Completed   HPV VACCINES  Aged Out    Health Maintenance  Health Maintenance Due  Topic Date Due   COVID-19 Vaccine (1) Never done   Fecal DNA (Cologuard)  Never done   Zoster Vaccines- Shingrix (1 of 2) Never done   MAMMOGRAM  04/16/2018   Pneumonia Vaccine 51+ Years old (2 - PPSV23 if available, else PCV20) 10/17/2018   INFLUENZA VACCINE  Never done     Colorectal Cancer screening, due, cologuard ordered today 08/09/21  Mammogram status: Ordered 08/25/21. Pt provided with contact info and advised to call to schedule appt.   Bone Density status: Ordered 08/25/21. Pt provided with contact info and advised to call to schedule appt.  Lung Cancer Screening: (Low Dose CT Chest recommended if Age 33-80 years, 30 pack-year currently smoking OR have quit w/in 15years.) does not qualify.     Additional Screening:  Hepatitis C Screening: does qualify; Completed 10/09/16  Vision Screening: Recommended annual ophthalmology exams for early detection of glaucoma and other disorders of the eye. Is the patient up to date with their annual eye exam?  Yes  Who is the provider or what is the name of the office in which the patient attends annual eye exams? Completed today 08/25/21 at PCP appointment    Dental Screening: Recommended annual dental exams for proper oral hygiene  Community Resource Referral / Chronic Care Management: CRR required this visit?  No   CCM required this visit?  No      Plan:     I have personally reviewed and noted the following in the patients chart:   Medical and social history Use of alcohol, tobacco or illicit drugs  Current medications and supplements including opioid prescriptions.  Functional ability and status Nutritional status Physical activity Advanced directives List of other physicians Hospitalizations, surgeries, and ER visits in previous 12 months Vitals Screenings to include cognitive, depression, and falls Referrals and appointments  In addition, I have reviewed and discussed with patient certain preventive protocols, quality metrics, and best practice recommendations. A written personalized care plan for preventive services as well as general preventive health recommendations were provided to patient.     Loma Messing, LPN   8/88/3584   Nurse Health Advisor  Nurse Notes:  none

## 2021-08-25 ENCOUNTER — Ambulatory Visit (INDEPENDENT_AMBULATORY_CARE_PROVIDER_SITE_OTHER): Payer: Medicare Other

## 2021-08-25 ENCOUNTER — Other Ambulatory Visit: Payer: Self-pay

## 2021-08-25 ENCOUNTER — Encounter: Payer: Self-pay | Admitting: Family Medicine

## 2021-08-25 ENCOUNTER — Ambulatory Visit (INDEPENDENT_AMBULATORY_CARE_PROVIDER_SITE_OTHER): Payer: Medicare Other | Admitting: Family Medicine

## 2021-08-25 VITALS — BP 128/88 | HR 71 | Ht 60.0 in | Wt 118.2 lb

## 2021-08-25 VITALS — BP 122/78 | HR 71 | Temp 97.7°F | Ht 59.0 in | Wt 118.0 lb

## 2021-08-25 DIAGNOSIS — T471X5A Adverse effect of other antacids and anti-gastric-secretion drugs, initial encounter: Secondary | ICD-10-CM

## 2021-08-25 DIAGNOSIS — Z0000 Encounter for general adult medical examination without abnormal findings: Secondary | ICD-10-CM

## 2021-08-25 DIAGNOSIS — Z1211 Encounter for screening for malignant neoplasm of colon: Secondary | ICD-10-CM

## 2021-08-25 DIAGNOSIS — R7303 Prediabetes: Secondary | ICD-10-CM

## 2021-08-25 DIAGNOSIS — J301 Allergic rhinitis due to pollen: Secondary | ICD-10-CM

## 2021-08-25 DIAGNOSIS — Z780 Asymptomatic menopausal state: Secondary | ICD-10-CM | POA: Diagnosis not present

## 2021-08-25 DIAGNOSIS — Z85820 Personal history of malignant melanoma of skin: Secondary | ICD-10-CM

## 2021-08-25 DIAGNOSIS — I10 Essential (primary) hypertension: Secondary | ICD-10-CM | POA: Diagnosis not present

## 2021-08-25 DIAGNOSIS — Z1231 Encounter for screening mammogram for malignant neoplasm of breast: Secondary | ICD-10-CM

## 2021-08-25 DIAGNOSIS — K219 Gastro-esophageal reflux disease without esophagitis: Secondary | ICD-10-CM | POA: Diagnosis not present

## 2021-08-25 DIAGNOSIS — R1319 Other dysphagia: Secondary | ICD-10-CM

## 2021-08-25 DIAGNOSIS — F4321 Adjustment disorder with depressed mood: Secondary | ICD-10-CM

## 2021-08-25 DIAGNOSIS — R1310 Dysphagia, unspecified: Secondary | ICD-10-CM | POA: Insufficient documentation

## 2021-08-25 DIAGNOSIS — M8580 Other specified disorders of bone density and structure, unspecified site: Secondary | ICD-10-CM

## 2021-08-25 LAB — VITAMIN B12: Vitamin B-12: 269 pg/mL (ref 200–1100)

## 2021-08-25 MED ORDER — TRAZODONE HCL 50 MG PO TABS: 50.0000 mg | ORAL_TABLET | Freq: Every evening | ORAL | 11 refills | Status: DC | PRN

## 2021-08-25 MED ORDER — OMEPRAZOLE 40 MG PO CPDR: 40.0000 mg | DELAYED_RELEASE_CAPSULE | Freq: Every day | ORAL | 11 refills | Status: DC

## 2021-08-25 NOTE — Patient Instructions (Addendum)
Zyrtec 10 mg (generic /store brand is fine) take at bedtime  Also flonase This is for allergies   I will print your orders for mammogram and dexa  You call [facility] to make the appt  Get started back on omeprazole 40 mg  I placed a GI referral for evaluation of your swallowing    Due to there being a large influx of referrals the GI offices are currently scheduling appointments as far out as late Aug/Sept 2022. They have asked that you call their office regarding your referral and scheduling needs. Otherwise, they will reach out to you as soon as they are able. They are working diligently to get patients called and scheduled as soon as possible.  Colonoscopies will be called according to date/time of the referral entry as these are not of urgent need.    [facility]  [phone] [facility]  [phone] [facility]  [phone]  [facility] can be considered as well. They may be able to book sooner appointments. You can reach [facility] at [phone].   Thank you for your patience and please let us know if you have any questions or concerns.    Thank You!  - [place] Referrals   Please call the location of your choice from the menu below to schedule your Mammogram and/or Bone Density appointment.    [facility]                      Phone:  [phone] [address], [place] [address]                                                             Services: Traditional and 3D Mammogram, [place] [facility]                 Phone: [phone] [address], [place] [address]    Service: Bone Density ONLY   *this site does NOT perform mammograms  [place]                        Phone:  [phone] [address]                                  [place], [place] [address]                                            Services:  3D Mammogram and Bone Density  [place] [facility]   Phone:  [phone]   [place] [place], [place] [address]                                            Services: 3D Mammogram and [place]  [place] at [facility])  Phone:  [phone]   [address] Room [place], [place] [address]                                              Services:  3D Mammogram and Bone Density   Start taking vitamin D3 over the counter and take 2000 iu daily  This is to help bone health   Please follow up with your dermatologist for skin cancer screening   Follow up with me next week to discuss chest symptoms  If you suddenly have chest pain or shortness of breath go to the ER

## 2021-08-25 NOTE — Patient Instructions (Signed)
Joann Buchanan , Thank you for taking time to come for your Medicare Wellness Visit. I appreciate your ongoing commitment to your health goals. Please review the following plan we discussed and let me know if I can assist you in the future.   Screening recommendations/referrals: Colonoscopy: due, cologuard ordered today Mammogram: due, last completed 04/15/17, ordered today someone will call to schedule an appointment Bone Density: due, last completed 11/25/17, ordered today someone will call to schedule an appointment Recommended yearly ophthalmology/optometry visit for glaucoma screening and checkup Recommended yearly dental visit for hygiene and checkup  Vaccinations: Influenza vaccine: Due, declined today, May obtain vaccine at our office or your local pharmacy,  Pneumococcal vaccine: Prevnar 23, due, declined today, May obtain vaccine at our office or your local pharmacy, Tdap vaccine: due, last completed 05/08/04 Shingles vaccine: Discuss with pharmacy   Covid-19:newest booster available at your local pharmacy  Advanced directives: Please bring a copy of Living Will and/or [place] for your chart, when available   Conditions/risks identified: see problem list   Next appointment: Follow up in one year for your annual wellness visit 08/28/22 @ 9:00am, this will be an in office visit   Preventive Care 65 Years and Older, Female Preventive care refers to lifestyle choices and visits with your health care provider that can promote health and wellness. What does preventive care include? A yearly physical exam. This is also called an annual well check. Dental exams once or twice a year. Routine eye exams. Ask your health care provider how often you should have your eyes checked. Personal lifestyle choices, including: Daily care of your teeth and gums. Regular physical activity. Eating a healthy diet. Avoiding tobacco and drug use. Limiting alcohol use. Practicing safe  sex. Taking low-dose aspirin every day. Taking vitamin and mineral supplements as recommended by your health care provider. What happens during an annual well check? The services and screenings done by your health care provider during your annual well check will depend on your age, overall health, lifestyle risk factors, and family history of disease. Counseling  Your health care provider may ask you questions about your: Alcohol use. Tobacco use. Drug use. Emotional well-being. Home and relationship well-being. Sexual activity. Eating habits. History of falls. Memory and ability to understand (cognition). Work and work Statistician. Reproductive health. Screening  You may have the following tests or measurements: Height, weight, and BMI. Blood pressure. Lipid and cholesterol levels. These may be checked every 5 years, or more frequently if you are over 60 years old. Skin check. Lung cancer screening. You may have this screening every year starting at age 74 if you have a 30-pack-year history of smoking and currently smoke or have quit within the past 15 years. Fecal occult blood test (FOBT) of the stool. You may have this test every year starting at age 72. Flexible sigmoidoscopy or colonoscopy. You may have a sigmoidoscopy every 5 years or a colonoscopy every 10 years starting at age 34. Hepatitis C blood test. Hepatitis B blood test. Sexually transmitted disease (STD) testing. Diabetes screening. This is done by checking your blood sugar (glucose) after you have not eaten for a while (fasting). You may have this done every 1-3 years. Bone density scan. This is done to screen for osteoporosis. You may have this done starting at age 67. Mammogram. This may be done every 1-2 years. Talk to your health care provider about how often you should have regular mammograms. Talk with your health care provider  about your test results, treatment options, and if necessary, the need for more  tests. Vaccines  Your health care provider may recommend certain vaccines, such as: Influenza vaccine. This is recommended every year. Tetanus, diphtheria, and acellular pertussis (Tdap, Td) vaccine. You may need a Td booster every 10 years. Zoster vaccine. You may need this after age 95. Pneumococcal 13-valent conjugate (PCV13) vaccine. One dose is recommended after age 14. Pneumococcal polysaccharide (PPSV23) vaccine. One dose is recommended after age 45. Talk to your health care provider about which screenings and vaccines you need and how often you need them. This information is not intended to replace advice given to you by your health care provider. Make sure you discuss any questions you have with your health care provider. Document Released: 07/22/2015 Document Revised: 03/14/2016 Document Reviewed: 04/26/2015 Elsevier Interactive Patient Education  2017 [employer] Prevention in the Home Falls can cause injuries. They can happen to people of all ages. There are many things you can do to make your home safe and to help prevent falls. What can I do on the outside of my home? Regularly fix the edges of walkways and driveways and fix any cracks. Remove anything that might make you trip as you walk through a door, such as a raised step or threshold. Trim any bushes or trees on the path to your home. Use bright outdoor lighting. Clear any walking paths of anything that might make someone trip, such as rocks or tools. Regularly check to see if handrails are loose or broken. Make sure that both sides of any steps have handrails. Any raised decks and porches should have guardrails on the edges. Have any leaves, snow, or ice cleared regularly. Use sand or salt on walking paths during winter. Clean up any spills in your garage right away. This includes oil or grease spills. What can I do in the bathroom? Use night lights. Install grab bars by the toilet and in the tub and shower.  Do not use towel bars as grab bars. Use non-skid mats or decals in the tub or shower. If you need to sit down in the shower, use a plastic, non-slip stool. Keep the floor dry. Clean up any water that spills on the floor as soon as it happens. Remove soap buildup in the tub or shower regularly. Attach bath mats securely with double-sided non-slip rug tape. Do not have throw rugs and other things on the floor that can make you trip. What can I do in the bedroom? Use night lights. Make sure that you have a light by your bed that is easy to reach. Do not use any sheets or blankets that are too big for your bed. They should not hang down onto the floor. Have a firm chair that has side arms. You can use this for support while you get dressed. Do not have throw rugs and other things on the floor that can make you trip. What can I do in the kitchen? Clean up any spills right away. Avoid walking on wet floors. Keep items that you use a lot in easy-to-reach places. If you need to reach something above you, use a strong step stool that has a grab bar. Keep electrical cords out of the way. Do not use floor polish or wax that makes floors slippery. If you must use wax, use non-skid floor wax. Do not have throw rugs and other things on the floor that can make you trip. What can I do  with my stairs? Do not leave any items on the stairs. Make sure that there are handrails on both sides of the stairs and use them. Fix handrails that are broken or loose. Make sure that handrails are as long as the stairways. Check any carpeting to make sure that it is firmly attached to the stairs. Fix any carpet that is loose or worn. Avoid having throw rugs at the top or bottom of the stairs. If you do have throw rugs, attach them to the floor with carpet tape. Make sure that you have a light switch at the top of the stairs and the bottom of the stairs. If you do not have them, ask someone to add them for you. What else  can I do to help prevent falls? Wear shoes that: Do not have high heels. Have rubber bottoms. Are comfortable and fit you well. Are closed at the toe. Do not wear sandals. If you use a stepladder: Make sure that it is fully opened. Do not climb a closed stepladder. Make sure that both sides of the stepladder are locked into place. Ask someone to hold it for you, if possible. Clearly mark and make sure that you can see: Any grab bars or handrails. First and last steps. Where the edge of each step is. Use tools that help you move around (mobility aids) if they are needed. These include: Canes. Walkers. Scooters. Crutches. Turn on the lights when you go into a dark area. Replace any light bulbs as soon as they burn out. Set up your furniture so you have a clear path. Avoid moving your furniture around. If any of your floors are uneven, fix them. If there are any pets around you, be aware of where they are. Review your medicines with your doctor. Some medicines can make you feel dizzy. This can increase your chance of falling. Ask your doctor what other things that you can do to help prevent falls. This information is not intended to replace advice given to you by your health care provider. Make sure you discuss any questions you have with your health care provider. Document Released: 04/21/2009 Document Revised: 12/01/2015 Document Reviewed: 07/30/2014 Elsevier Interactive Patient Education  2017 [employer].

## 2021-08-25 NOTE — Progress Notes (Signed)
Subjective:    Patient ID: Joann Buchanan, female    DOB: 05-09-1946, 76 y.o.   MRN: [id number]  This visit occurred during the SARS-CoV-2 public health emergency.  Safety protocols were in place, including screening questions prior to the visit, additional usage of staff PPE, and extensive cleaning of exam room while observing appropriate contact time as indicated for disinfecting solutions.   HPI Here for health maintenance exam and to review chronic medical problems    Wt Readings from Last 3 Encounters:  08/25/21 118 lb (53.5 kg)  08/25/21 118 lb 3.2 oz (53.6 kg)  03/31/19 138 lb (62.6 kg)   23.83 kg/m   Staying busy  A lot of sinus problems  It happens when going outside /allergies  Using flonase ns   Had amw Reviewed   Colon cancer screen: ordered cologuard   Last one never resulted for some reason    Zoster status- declines shingrix    Mammogram 04/2017 - the order was put in and she will call to schedule Self breast exam-no lumps  Family h/o breast cancer    Prevnar 2019   Declines flu vaccine  Mood/grief Does not sleep as a rule  Sleeps 3 hours at a time  Trazodone 50 mg   Dexa 11/2017 osteopenia No falls No fractures  Not on vitamin D  Hates medication  Does a lot of walking  Some yard work    H/o melanoma Derm care  -needs to go/overdue   HTN bp is stable today  No cp or palpitations or headaches or edema  No medicines. Manages with lifestyle measures   BP Readings from Last 3 Encounters:  08/25/21 122/78  08/25/21 128/88  06/28/21 139/90     Takes omeprazole for gerd Occ food stops half way down when swallowing Gets choked     Prediabetes Lab Results  Component Value Date   HGBA1C 5.9 03/24/2019   Eating more green veggies In general /eating better    Cholesterol Lab Results  Component Value Date   CHOL 203 (H) 03/24/2019   HDL 49.60 03/24/2019   LDLCALC 100 (H) 08/13/2017   LDLDIRECT 103.0 03/24/2019   TRIG 236.0  (H) 03/24/2019   CHOLHDL 4 03/24/2019   Last year had a deer tick on upper arm Muscle hurts after a long time    Patient Active Problem List   Diagnosis Date Noted   Chest discomfort 08/27/2021   Adverse effect of proton pump inhibitor 08/25/2021   Dysphagia 08/25/2021   Strain of forearm, right 12/14/2020   Grief reaction 11/30/2020   Prediabetes 05/09/2018   Chest pain 08/13/2017   Encounter for screening mammogram for breast cancer 10/30/2016   S/P cervical spinal fusion 12/02/2015   Osteopenia 10/25/2015   Estrogen deficiency 10/05/2015   Routine general medical examination at a health care facility 09/25/2015   Acute sinusitis 07/06/2015   Hand tingling 04/05/2015   Encounter for Medicare annual wellness exam 03/03/2013   OSTEOARTHRITIS, HANDS, 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    HTN (hypertension)    Hx of fibrocystic disease of breast    Melanoma ([place])    Migraine  Numbness and tingling    bilateral hands   Osteoarthritis of hand    PONV (postoperative nausea and vomiting)    Past Surgical History:  Procedure Laterality Date   ANTERIOR CERVICAL DECOMP/DISCECTOMY FUSION N/A 12/02/2015   Procedure: ANTERIOR CERVICAL DECOMPRESSION/DISCECTOMY FUSION CERVICAL FOUR-FIVE;  Surgeon: Eustace Moore, MD;  Location: [facility];  Service: Neurosurgery;  Laterality: N/A;   APPENDECTOMY     BREAST BIOPSY     normal   CATARACT EXTRACTION Right Jan 2017   CATARACT EXTRACTION W/ INTRAOCULAR LENS IMPLANT Left 2018   DEXA  7/07   negative   EMG     left upper arm- neg   ESOPHAGOGASTRODUODENOSCOPY     normal-24 hour PH probe (-)   EYE SURGERY  10/09   Left eye; for  double vision   PARTIAL HYSTERECTOMY     Ovaries intact   TUBAL LIGATION     Social History   Tobacco Use   Smoking status: Never   Smokeless tobacco: Never  Substance Use Topics   Alcohol use: No    Alcohol/week: 0.0 standard drinks   Drug use: No   Family History  Problem Relation Age of Onset   Breast cancer Mother        Died, 58   Other Father        Died, 46 - tree fell on him   Breast cancer Sister        Died, 53   Heart disease Sister        Died, 39   Healthy Son    Healthy Daughter    Allergies  Allergen Reactions   Sulfonamide Derivatives Anaphylaxis and Other (See Comments)    REACTION: unspecified Hives and swelling   Current Outpatient Medications on File Prior to Visit  Medication Sig Dispense Refill   ALPRAZolam (XANAX) 0.25 MG tablet Take 1 tablet (0.25 mg total) by mouth as needed. Prior to flights 10 tablet 1   Aspirin-Salicylamide-Caffeine 387-56-43 MG TABS Take 1 packet by mouth daily as needed (for headache).     fluticasone (FLONASE) 50 MCG/ACT nasal spray Place 2 sprays into both nostrils daily. 16 g 6   ibuprofen (ADVIL,MOTRIN) 200 MG tablet 200 mg.     methocarbamol (ROBAXIN) 500 MG tablet TAKE 1 TABLET (500 MG TOTAL) BY MOUTH 3 (THREE) TIMES DAILY AS NEEDED (HEADACHE). 30 tablet 2   Polyethyl Glycol-Propyl Glycol 0.4-0.3 % SOLN Place 1 drop into both eyes as needed.     SUMAtriptan (IMITREX) 20 MG/ACT nasal spray USE 1 SPRAY INTO THE NOSE EVERY 2 HOURS AS NEEDED FOR MIGRAINE. NO MORE THAN 2 DOSES IN 24 HOURS 6 each 1   No current facility-administered medications on file prior to visit.    Review of Systems  Constitutional:  Negative for activity change, appetite change, fatigue, fever and unexpected weight change.  HENT:  Negative for congestion, ear pain, rhinorrhea, sinus pressure and sore throat.   Eyes:  Negative for pain, redness and visual disturbance.  Respiratory:  Negative for cough, shortness of breath and wheezing.    Cardiovascular:  Negative for chest pain, palpitations and leg swelling.       Occasional chest discomfort Pressure /sometimes when swallowing   Gastrointestinal:  Negative for abdominal pain, blood in stool, constipation and diarrhea.  Endocrine: Negative for polydipsia and polyuria.  Genitourinary:  Negative for dysuria, frequency and urgency.  Musculoskeletal:  Negative for arthralgias, back pain and myalgias.  Skin:  Negative for pallor and  rash.  Allergic/Immunologic: Negative for environmental allergies.  Neurological:  Negative for dizziness, syncope and headaches.  Hematological:  Negative for adenopathy. Does not bruise/bleed easily.  Psychiatric/Behavioral:  Negative for decreased concentration and dysphoric mood. The patient is not nervous/anxious.       Objective:   Physical Exam Constitutional:      General: She is not in acute distress.    Appearance: Normal appearance. She is well-developed and normal weight. She is not ill-appearing or diaphoretic.  HENT:     Head: Normocephalic and atraumatic.     Right Ear: Tympanic membrane, ear canal and external ear normal.     Left Ear: Tympanic membrane, ear canal and external ear normal.     Nose: Nose normal. No congestion.     Mouth/Throat:     Mouth: Mucous membranes are moist.     Pharynx: Oropharynx is clear. No posterior oropharyngeal erythema.  Eyes:     General: No scleral icterus.    Extraocular Movements: Extraocular movements intact.     Conjunctiva/sclera: Conjunctivae normal.     Pupils: Pupils are equal, round, and reactive to light.  Neck:     Thyroid: No thyromegaly.     Vascular: No carotid bruit or JVD.  Cardiovascular:     Rate and Rhythm: Normal rate and regular rhythm.     Pulses: Normal pulses.     Heart sounds: Normal heart sounds.    No gallop.  Pulmonary:     Effort: Pulmonary effort is normal. No respiratory distress.     Breath sounds: Normal breath sounds. No wheezing or rhonchi.      Comments: Good air exch Chest:     Chest wall: No tenderness.  Abdominal:     General: Bowel sounds are normal. There is no distension or abdominal bruit.     Palpations: Abdomen is soft. There is no mass.     Tenderness: There is no abdominal tenderness.     Hernia: No hernia is present.  Genitourinary:    Comments: Breast exam: No mass, nodules, thickening, tenderness, bulging, retraction, inflamation, nipple discharge or skin changes noted.  No axillary or clavicular LA.     Musculoskeletal:        General: No tenderness. Normal range of motion.     Cervical back: Normal range of motion and neck supple. No rigidity. No muscular tenderness.     Right lower leg: No edema.     Left lower leg: No edema.     Comments: No kyphosis   Lymphadenopathy:     Cervical: No cervical adenopathy.  Skin:    General: Skin is warm and dry.     Coloration: Skin is not pale.     Findings: No erythema or rash.     Comments: Solar lentigines diffusely Some sks   Neurological:     Mental Status: She is alert. Mental status is at baseline.     Cranial Nerves: No cranial nerve deficit.     Motor: No abnormal muscle tone.     Coordination: Coordination normal.     Gait: Gait normal.     Deep Tendon Reflexes: Reflexes are normal and symmetric. Reflexes normal.  Psychiatric:        Mood and Affect: Mood is anxious.        Cognition and Memory: Cognition and memory normal.     Comments: Mildly anxious  Pleasant  Candid about symptoms and stressors  Assessment & Plan:   Problem List Items Addressed This Visit       Cardiovascular and Mediastinum   Essential hypertension    bp in fair control at this time  BP Readings from Last 1 Encounters:  08/25/21 122/78  No changes needed-continue healthy diet/exercise  Most recent labs reviewed  Disc lifstyle change with low sodium diet and exercise        Relevant Orders   CBC with Differential/Platelet (Completed)   Comprehensive  metabolic panel (Completed)   Lipid panel (Completed)   TSH (Completed)     Respiratory   Allergic rhinitis    More sinus trouble -outdors Uses flonase  Suggest trial of zyrtec 10 mg daily        Digestive   Dysphagia    Esophageal Takes omeprazole for GERD and does not report heartburn   Feels like food gets stuck 1/2 way down Referral done to GI for eval/ likely EGD      Relevant Orders   Ambulatory referral to Gastroenterology   GERD    Takes omeprazole 40  Some esophageal dysphagia  Ref made to GI      Relevant Medications   omeprazole (PRILOSEC) 40 MG capsule     Musculoskeletal and Integument   Osteopenia    dexa rev 2019  Ordered next one and pt will call [facility] to schedule  Enc strongly to get back on vit D Exercise is fair  No falls or fx        Other   Adverse effect of proton pump inhibitor    Vit B12 level added to labs      Relevant Orders   Vitamin B12 (Completed)   Grief reaction    Reviewed stressors/ coping techniques/symptoms/ support sources/ tx options and side effects in detail today Doing fairly well -very functional and with good support  Declines counseling      History of melanoma    Encouraged routine f/u with gyn      Prediabetes    A1c ordered  disc imp of low glycemic diet and wt loss to prevent DM2       Relevant Orders   Hemoglobin A1c (Completed)   Routine general medical examination at a health care facility - Primary    Reviewed health habits including diet and exercise and skin cancer prevention Reviewed appropriate screening tests for age  Also reviewed health mt list, fam hx and immunization status , as well as social and family history   See HPI Labs ordered cologuard ordered Declines shingrix Mammogram and dexa orders are in -pt will call to schedule at [facility] No falls or fractures Encouraged her to take vitamin D3 2000 iu daily Discussed menatl health and grief  Will return for f/u of chest discomfort  and swallowing problem

## 2021-08-26 LAB — CBC WITH DIFFERENTIAL/PLATELET
Absolute Monocytes: 686 cells/uL (ref 200–950)
Basophils Absolute: 37 cells/uL (ref 0–200)
Basophils Relative: 0.5 %
Eosinophils Absolute: 80 cells/uL (ref 15–500)
Eosinophils Relative: 1.1 %
HCT: 40.3 % (ref 35.0–45.0)
Hemoglobin: 13.4 g/dL (ref 11.7–15.5)
Lymphs Abs: 2504 cells/uL (ref 850–3900)
MCH: 29.6 pg (ref 27.0–33.0)
MCHC: 33.3 g/dL (ref 32.0–36.0)
MCV: 89 fL (ref 80.0–100.0)
MPV: 10.5 fL (ref 7.5–12.5)
Monocytes Relative: 9.4 %
Neutro Abs: 3993 cells/uL (ref 1500–7800)
Neutrophils Relative %: 54.7 %
Platelets: 378 10*3/uL (ref 140–400)
RBC: 4.53 10*6/uL (ref 3.80–5.10)
RDW: 13.1 % (ref 11.0–15.0)
Total Lymphocyte: 34.3 %
WBC: 7.3 10*3/uL (ref 3.8–10.8)

## 2021-08-26 LAB — COMPREHENSIVE METABOLIC PANEL
AG Ratio: 2 (calc) (ref 1.0–2.5)
ALT: 10 U/L (ref 6–29)
AST: 13 U/L (ref 10–35)
Albumin: 4.3 g/dL (ref 3.6–5.1)
Alkaline phosphatase (APISO): 72 U/L (ref 37–153)
BUN: 16 mg/dL (ref 7–25)
CO2: 25 mmol/L (ref 20–32)
Calcium: 10.7 mg/dL — ABNORMAL HIGH (ref 8.6–10.4)
Chloride: 106 mmol/L (ref 98–110)
Creat: 0.97 mg/dL (ref 0.60–1.00)
Globulin: 2.2 g/dL (calc) (ref 1.9–3.7)
Glucose, Bld: 97 mg/dL (ref 65–99)
Potassium: 4.8 mmol/L (ref 3.5–5.3)
Sodium: 142 mmol/L (ref 135–146)
Total Bilirubin: 0.4 mg/dL (ref 0.2–1.2)
Total Protein: 6.5 g/dL (ref 6.1–8.1)

## 2021-08-26 LAB — LIPID PANEL
Cholesterol: 207 mg/dL — ABNORMAL HIGH (ref <200)
HDL: 60 mg/dL (ref >=50)
LDL Cholesterol (Calc): 120 mg/dL (calc) — ABNORMAL HIGH
Non-HDL Cholesterol (Calc): 147 mg/dL (calc) — ABNORMAL HIGH (ref <130)
Total CHOL/HDL Ratio: 3.5 (calc) (ref <5.0)
Triglycerides: 152 mg/dL — ABNORMAL HIGH (ref <150)

## 2021-08-26 LAB — TSH: TSH: 0.87 mIU/L (ref 0.40–4.50)

## 2021-08-26 LAB — HEMOGLOBIN A1C
Hgb A1c MFr Bld: 5.4 % of total Hgb (ref <5.7)
Mean Plasma Glucose: 108 mg/dL
eAG (mmol/L): 6 mmol/L

## 2021-08-27 DIAGNOSIS — R0789 Other chest pain: Secondary | ICD-10-CM | POA: Insufficient documentation

## 2021-08-27 NOTE — Assessment & Plan Note (Signed)
Reviewed stressors/ coping techniques/symptoms/ support sources/ tx options and side effects in detail today Doing fairly well -very functional and with good support  Declines counseling

## 2021-08-27 NOTE — Assessment & Plan Note (Signed)
Takes omeprazole 40  Some esophageal dysphagia  Ref made to GI

## 2021-08-27 NOTE — Assessment & Plan Note (Signed)
Intermittent and not exertional  Unsure if related to gerd and dysphagia  Pt will return next week for further eval ER precautions discussed

## 2021-08-27 NOTE — Assessment & Plan Note (Signed)
Encouraged routine f/u with gyn

## 2021-08-27 NOTE — Assessment & Plan Note (Signed)
bp in fair control at this time  BP Readings from Last 1 Encounters:  08/25/21 122/78   No changes needed-continue healthy diet/exercise  Most recent labs reviewed  Disc lifstyle change with low sodium diet and exercise

## 2021-08-27 NOTE — Assessment & Plan Note (Signed)
Esophageal Takes omeprazole for GERD and does not report heartburn   Feels like food gets stuck 1/2 way down Referral done to GI for eval/ likely EGD

## 2021-08-27 NOTE — Assessment & Plan Note (Signed)
Reviewed health habits including diet and exercise and skin cancer prevention Reviewed appropriate screening tests for age  Also reviewed health mt list, fam hx and immunization status , as well as social and family history   See HPI Labs ordered cologuard ordered Declines shingrix Mammogram and dexa orders are in -pt will call to schedule at [facility] No falls or fractures Encouraged her to take vitamin D3 2000 iu daily Discussed menatl health and grief  Will return for f/u of chest discomfort and swallowing problem

## 2021-08-27 NOTE — Assessment & Plan Note (Signed)
dexa rev 2019  Ordered next one and pt will call [facility] to schedule  Enc strongly to get back on vit D Exercise is fair  No falls or fx

## 2021-08-27 NOTE — Assessment & Plan Note (Signed)
Vit B12 level added to labs  

## 2021-08-27 NOTE — Assessment & Plan Note (Signed)
A1c ordered disc imp of low glycemic diet and wt loss to prevent DM2  

## 2021-08-27 NOTE — Assessment & Plan Note (Signed)
More sinus trouble -outdors Uses flonase  Suggest trial of zyrtec 10 mg daily

## 2021-08-30 ENCOUNTER — Ambulatory Visit (INDEPENDENT_AMBULATORY_CARE_PROVIDER_SITE_OTHER): Payer: Medicare Other | Admitting: Family Medicine

## 2021-08-30 ENCOUNTER — Ambulatory Visit (INDEPENDENT_AMBULATORY_CARE_PROVIDER_SITE_OTHER)
Admission: RE | Admit: 2021-08-30 | Discharge: 2021-08-30 | Disposition: A | Discharge status: Home / Self Care | Payer: Medicare Other | Source: Ambulatory Visit | Attending: Family Medicine | Admitting: Family Medicine

## 2021-08-30 ENCOUNTER — Other Ambulatory Visit: Payer: Self-pay

## 2021-08-30 ENCOUNTER — Encounter: Payer: Self-pay | Admitting: Family Medicine

## 2021-08-30 VITALS — BP 136/76 | HR 109 | Temp 98.0°F | Ht 59.0 in | Wt 120.0 lb

## 2021-08-30 DIAGNOSIS — I10 Essential (primary) hypertension: Secondary | ICD-10-CM

## 2021-08-30 DIAGNOSIS — K219 Gastro-esophageal reflux disease without esophagitis: Secondary | ICD-10-CM | POA: Diagnosis not present

## 2021-08-30 DIAGNOSIS — R0789 Other chest pain: Secondary | ICD-10-CM

## 2021-08-30 DIAGNOSIS — R002 Palpitations: Secondary | ICD-10-CM

## 2021-08-30 DIAGNOSIS — R1319 Other dysphagia: Secondary | ICD-10-CM

## 2021-08-30 DIAGNOSIS — E785 Hyperlipidemia, unspecified: Secondary | ICD-10-CM

## 2021-08-30 MED ORDER — FAMOTIDINE 20 MG PO TABS: 20.0000 mg | ORAL_TABLET | Freq: Two times a day (BID) | ORAL | 3 refills | Status: DC

## 2021-08-30 NOTE — Assessment & Plan Note (Signed)
Ref was made to GI Urged pt to call and make the appt  GERD is not well controlled with omeprazole  Will add pepcid 20 mg bid and see if helpful

## 2021-08-30 NOTE — Patient Instructions (Signed)
Call GI and work on your appointment    Start pepcid 20 mg twice daily  Continue omeprazole  Avoid caffeine if you can Avoid triggering foods for heartburn   If symptoms suddenly worsen please let us know  If severe -go to the ER    Chext xray today  I placed a cardiology referral- you will get a call

## 2021-08-30 NOTE — Assessment & Plan Note (Signed)
Not opt control Continues omeprazole 40 mg daily  Add pepcid 20 mg bid  Watch diet  Referral to Gi for this and dysphagia May need EGD

## 2021-08-30 NOTE — Assessment & Plan Note (Signed)
bp in fair control at this time  BP Readings from Last 1 Encounters:  08/30/21 136/76   No medication needed Most recent labs reviewed  Disc lifstyle change with low sodium diet and exercise

## 2021-08-30 NOTE — Assessment & Plan Note (Signed)
Disc goals for lipids and reasons to control them Rev last labs with pt Rev low sat fat diet in detail Ratio 3.5 Diet is fair  Sister had MI at 55  May want to consider statin in future

## 2021-08-30 NOTE — Assessment & Plan Note (Signed)
Episodic brief and at rest but with clamminess  Has had card w/u several times in past  Ca score of 0 on CT in 2019  Cardiology notes reviewed   In light of the fact that this feels different and also with palpitations-ref made to cardiology for eval  Disc poss that GERD plays a role also - poss esoph spasm (has GI appt in the works)

## 2021-08-30 NOTE — Assessment & Plan Note (Signed)
With new chest symptoms Reassuring exam and ekg and prior w/u  Ref to cardiology  inst to hold caffeine

## 2021-08-30 NOTE — Progress Notes (Signed)
Subjective:    Patient ID: Joann Buchanan, female    DOB: 1945/08/09, 76 y.o.   MRN: [id number]  This visit occurred during the SARS-CoV-2 public health emergency.  Safety protocols were in place, including screening questions prior to the visit, additional usage of staff PPE, and extensive cleaning of exam room while observing appropriate contact time as indicated for disinfecting solutions.   HPI Pt presents for f/u of chest discomfort   Wt Readings from Last 3 Encounters:  08/30/21 120 lb (54.4 kg)  08/25/21 118 lb (53.5 kg)  08/25/21 118 lb 3.2 oz (53.6 kg)   24.24 kg/m  At the end of last visit pt mentioned episode of chest discomfort with sweats   Episodic (first approx 2 y ago)  Last episode last night -not quite as tight , was working on a book mark project  Feels tightness all the way around her chest  Back hurts but that is her baseline   Spells last less than a minute  At different times during the day when she active but not in full exertion  A little clammy/sweaty (almost like a cold chill)  Not anxious or upset at all , no panic symptoms  Does not get nauseated  No sob  Some heart palpitation/like a flip flop  At other times she feels like her heart rate gets higher than it should when she exerts herself   It goes away by itself (like it releases)  No problems with actual exercise (like yard work)   This does not feel like any of the past times   Also some problems with food getting stuck in esoph when swallowing - GI referral was done E takes omeprazole 40 mg daily and watches her diet  She gets heartburn every other day after eating the wrong thing  She wakes up with it during the night - then has to vomit and goes back to bed    EKG today: NSR rate of 76 with low voltage in precordial leads No change from prior Nl PR and QT intervals and no ST or T wave changes   Past cardiology studies Stress myoview 2017 normal with EF 55-65% 2D echo in 2019 EF  65-70% with grade 1 DD Trivial mitral valve regurgitation  She saw Dr Rockey Situ in 2018 after hosp for non cardiac cp and elevated BP  She then had another hosp for cp in 2019 thought to be due to a stressful situation Cardiology was consulted and CT showed minimal plaque in mid LAD otherwise nl coronaries with calcium score of 0  Lab Results  Component Value Date   CHOL 207 (H) 08/25/2021   HDL 60 08/25/2021   LDLCALC 120 (H) 08/25/2021   LDLDIRECT 103.0 03/24/2019   TRIG 152 (H) 08/25/2021   CHOLHDL 3.5 08/25/2021  LDL was up from 103 prior but triglycerides were down Fam hx: sister died with heart dz at 55   HTN bp is stable today  No cp or palpitations or headaches or edema  No side effects to medicines  BP Readings from Last 3 Encounters:  08/30/21 136/76  08/25/21 122/78  08/25/21 128/88    Well controlled without medication   Lab Results  Component Value Date   CREATININE 0.97 08/25/2021   BUN 16 08/25/2021   NA 142 08/25/2021   K 4.8 08/25/2021   CL 106 08/25/2021   CO2 25 08/25/2021   Lab Results  Component Value Date   ALT 10 08/25/2021  AST 13 08/25/2021   ALKPHOS 73 03/24/2019   BILITOT 0.4 08/25/2021   Lab Results  Component Value Date   WBC 7.3 08/25/2021   HGB 13.4 08/25/2021   HCT 40.3 08/25/2021   MCV 89.0 08/25/2021   PLT 378 08/25/2021   Lab Results  Component Value Date   TSH 0.87 08/25/2021   Lab Results  Component Value Date   VITAMINB12 269 08/25/2021    Patient Active Problem List   Diagnosis Date Noted   Encounter for screening mammogram for breast cancer 10/30/2016    Priority: Low   Hyperlipidemia, mild 08/30/2021   Palpitations 08/30/2021   Chest discomfort 08/27/2021   Adverse effect of proton pump inhibitor 08/25/2021   Dysphagia 08/25/2021   Strain of forearm, right 12/14/2020   Grief reaction 11/30/2020   Prediabetes 05/09/2018   S/P cervical spinal fusion 12/02/2015   Osteopenia 10/25/2015   Estrogen deficiency  10/05/2015   Routine general medical examination at a health care facility 09/25/2015   Hand tingling 04/05/2015   Encounter for Medicare annual wellness exam 03/03/2013   OSTEOARTHRITIS, HANDS, 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    HTN (hypertension)    Hx of fibrocystic disease of breast    Melanoma ([place])    Migraine    Numbness and tingling    bilateral hands   Osteoarthritis of hand    PONV (postoperative nausea and vomiting)    Past Surgical History:  Procedure Laterality Date   ANTERIOR CERVICAL DECOMP/DISCECTOMY FUSION N/A 12/02/2015   Procedure: ANTERIOR CERVICAL DECOMPRESSION/DISCECTOMY FUSION CERVICAL FOUR-FIVE;  Surgeon: Eustace Moore, MD;  Location: [facility];  Service: Neurosurgery;  Laterality: N/A;   APPENDECTOMY     BREAST BIOPSY     normal   CATARACT EXTRACTION Right Jan 2017   CATARACT EXTRACTION W/ INTRAOCULAR LENS IMPLANT Left 2018   DEXA  7/07   negative   EMG     left upper arm- neg   ESOPHAGOGASTRODUODENOSCOPY     normal-24 hour PH probe (-)   EYE SURGERY  10/09   Left eye; for double vision   PARTIAL HYSTERECTOMY     Ovaries intact   TUBAL LIGATION     Social History   Tobacco Use   Smoking status: Never   Smokeless tobacco: Never  Substance Use Topics   Alcohol use: No    Alcohol/week: 0.0 standard drinks   Drug use: No   Family History  Problem Relation Age of Onset   Breast cancer Mother        Died, 55   Other Father        Died, 30 - tree fell on him   Breast cancer Sister        Died, 67   Heart disease Sister        Died, 61   Healthy Son    Healthy Daughter    Allergies  Allergen Reactions   Sulfonamide Derivatives  Anaphylaxis and Other (See Comments)    REACTION: unspecified Hives and swelling   Current Outpatient Medications on File Prior to Visit  Medication Sig Dispense Refill   ALPRAZolam (XANAX) 0.25 MG tablet Take 1  tablet (0.25 mg total) by mouth as needed. Prior to flights 10 tablet 1   Aspirin-Salicylamide-Caffeine 481-85-63 MG TABS Take 1 packet by mouth daily as needed (for headache).     fluticasone (FLONASE) 50 MCG/ACT nasal spray Place 2 sprays into both nostrils daily. 16 g 6   ibuprofen (ADVIL,MOTRIN) 200 MG tablet 200 mg.     methocarbamol (ROBAXIN) 500 MG tablet TAKE 1 TABLET (500 MG TOTAL) BY MOUTH 3 (THREE) TIMES DAILY AS NEEDED (HEADACHE). 30 tablet 2   omeprazole (PRILOSEC) 40 MG capsule Take 1 capsule (40 mg total) by mouth daily. 30 capsule 11   Polyethyl Glycol-Propyl Glycol 0.4-0.3 % SOLN Place 1 drop into both eyes as needed.     SUMAtriptan (IMITREX) 20 MG/ACT nasal spray USE 1 SPRAY INTO THE NOSE EVERY 2 HOURS AS NEEDED FOR MIGRAINE. NO MORE THAN 2 DOSES IN 24 HOURS 6 each 1   traZODone (DESYREL) 50 MG tablet Take 1-2 tablets (50-100 mg total) by mouth at bedtime as needed. for sleep 60 tablet 11   No current facility-administered medications on file prior to visit.    Review of Systems  Constitutional:  Negative for activity change, appetite change, fatigue, fever and unexpected weight change.  HENT:  Negative for congestion, ear pain, rhinorrhea, sinus pressure and sore throat.   Eyes:  Negative for pain, redness and visual disturbance.  Respiratory:  Negative for cough, shortness of breath and wheezing.   Cardiovascular:  Positive for chest pain. Negative for palpitations and leg swelling.  Gastrointestinal:  Negative for abdominal distention, abdominal pain, blood in stool, constipation, diarrhea, nausea and rectal pain.       Heartburn/indigestion     Endocrine: Negative for polydipsia and polyuria.  Genitourinary:  Negative for dysuria, frequency and urgency.   Musculoskeletal:  Negative for arthralgias, back pain and myalgias.  Skin:  Negative for pallor and rash.  Allergic/Immunologic: Negative for environmental allergies.  Neurological:  Negative for dizziness, syncope and headaches.  Hematological:  Negative for adenopathy. Does not bruise/bleed easily.  Psychiatric/Behavioral:  Negative for decreased concentration and dysphoric mood. The patient is not nervous/anxious.       Objective:   Physical Exam Constitutional:      General: She is not in acute distress.    Appearance: Normal appearance. She is well-developed and normal weight. She is not ill-appearing or diaphoretic.  HENT:     Head: Normocephalic and atraumatic.  Eyes:     Conjunctiva/sclera: Conjunctivae normal.     Pupils: Pupils are equal, round, and reactive to light.  Neck:     Thyroid: No thyromegaly.     Vascular: No carotid bruit or JVD.  Cardiovascular:     Rate and Rhythm: Normal rate and regular rhythm.     Heart sounds: Normal heart sounds.    No gallop.  Pulmonary:     Effort: Pulmonary effort is normal. No respiratory distress.     Breath sounds: Normal breath sounds. No stridor. No wheezing, rhonchi or rales.  Chest:     Chest wall: No tenderness.  Abdominal:     General: Abdomen is flat. Bowel sounds are normal. There is no distension or abdominal bruit.     Palpations: Abdomen is soft. There is no mass.     Tenderness: There is abdominal tenderness in the epigastric area. There is no right CVA tenderness, left CVA tenderness, guarding or rebound. Negative signs include Murphy's sign.     Comments: Mild epigastric pain   Musculoskeletal:  Cervical back: Normal range of motion and neck supple.     Right lower leg: No edema.     Left lower leg: No edema.  Lymphadenopathy:     Cervical: No cervical adenopathy.  Skin:    General: Skin is warm and dry.     Coloration: Skin is not pale.     Findings: No rash.  Neurological:     Mental Status: She is  alert.     Coordination: Coordination normal.     Deep Tendon Reflexes: Reflexes are normal and symmetric. Reflexes normal.  Psychiatric:        Mood and Affect: Mood normal.     Comments: Pleasant  Not anxious           Assessment & Plan:   Problem List Items Addressed This Visit       Cardiovascular and Mediastinum   Essential hypertension    bp in fair control at this time  BP Readings from Last 1 Encounters:  08/30/21 136/76  No medication needed Most recent labs reviewed  Disc lifstyle change with low sodium diet and exercise          Digestive   Dysphagia    Ref was made to GI Urged pt to call and make the appt  GERD is not well controlled with omeprazole  Will add pepcid 20 mg bid and see if helpful      GERD    Not opt control Continues omeprazole 40 mg daily  Add pepcid 20 mg bid  Watch diet  Referral to Gi for this and dysphagia May need EGD      Relevant Medications   famotidine (PEPCID) 20 MG tablet     Other   Chest discomfort - Primary    Episodic brief and at rest but with clamminess  Has had card w/u several times in past  Ca score of 0 on CT in 2019  Cardiology notes reviewed   In light of the fact that this feels different and also with palpitations-ref made to cardiology for eval  Disc poss that GERD plays a role also - poss esoph spasm (has GI appt in the works)      Relevant Orders   EKG 12-Lead (Completed)   DG Chest 2 View   Ambulatory referral to Cardiology   Hyperlipidemia, mild    Disc goals for lipids and reasons to control them Rev last labs with pt Rev low sat fat diet in detail Ratio 3.5 Diet is fair  Sister had MI at 40  May want to consider statin in future      Palpitations    With new chest symptoms Reassuring exam and ekg and prior w/u  Ref to cardiology  inst to hold caffeine       Relevant Orders   Ambulatory referral to Cardiology

## 2021-09-05 ENCOUNTER — Encounter: Payer: Self-pay | Admitting: Family Medicine

## 2021-09-05 DIAGNOSIS — E538 Deficiency of other specified B group vitamins: Secondary | ICD-10-CM | POA: Insufficient documentation

## 2021-10-11 LAB — COLOGUARD: COLOGUARD: NEGATIVE

## 2021-11-25 ENCOUNTER — Other Ambulatory Visit: Payer: Self-pay | Admitting: Family Medicine

## 2021-11-28 ENCOUNTER — Encounter: Payer: Self-pay | Admitting: Family Medicine

## 2021-11-28 ENCOUNTER — Ambulatory Visit: Payer: Medicare Other | Admitting: Cardiovascular Disease

## 2021-11-28 NOTE — Telephone Encounter (Signed)
F/u was on 08/30/21, last filled on 06/06/21 #6 each with 1 refill

## 2021-12-20 ENCOUNTER — Other Ambulatory Visit: Payer: Self-pay | Admitting: Family Medicine

## 2021-12-27 ENCOUNTER — Other Ambulatory Visit: Payer: Self-pay | Admitting: Family Medicine

## 2022-02-20 ENCOUNTER — Observation Stay: Payer: POS

## 2022-02-20 ENCOUNTER — Observation Stay
Admission: EM | Admit: 2022-02-20 | Discharge: 2022-02-22 | Disposition: A | Discharge status: Home / Self Care | Payer: POS | Attending: Internal Medicine | Admitting: Internal Medicine

## 2022-02-20 ENCOUNTER — Telehealth: Payer: Self-pay | Admitting: Family Medicine

## 2022-02-20 ENCOUNTER — Emergency Department: Payer: POS

## 2022-02-20 ENCOUNTER — Observation Stay (HOSPITAL_BASED_OUTPATIENT_CLINIC_OR_DEPARTMENT_OTHER)
Admit: 2022-02-20 | Discharge: 2022-02-20 | Disposition: A | Discharge status: Home / Self Care | Payer: POS | Attending: Neurology | Admitting: Neurology

## 2022-02-20 ENCOUNTER — Other Ambulatory Visit: Payer: Self-pay

## 2022-02-20 ENCOUNTER — Encounter: Payer: Self-pay | Admitting: Medical Oncology

## 2022-02-20 DIAGNOSIS — Z7902 Long term (current) use of antithrombotics/antiplatelets: Secondary | ICD-10-CM | POA: Diagnosis not present

## 2022-02-20 DIAGNOSIS — Z79899 Other long term (current) drug therapy: Secondary | ICD-10-CM | POA: Diagnosis not present

## 2022-02-20 DIAGNOSIS — Z20822 Contact with and (suspected) exposure to covid-19: Secondary | ICD-10-CM | POA: Insufficient documentation

## 2022-02-20 DIAGNOSIS — I10 Essential (primary) hypertension: Secondary | ICD-10-CM | POA: Insufficient documentation

## 2022-02-20 DIAGNOSIS — Z8673 Personal history of transient ischemic attack (TIA), and cerebral infarction without residual deficits: Secondary | ICD-10-CM | POA: Diagnosis present

## 2022-02-20 DIAGNOSIS — G43909 Migraine, unspecified, not intractable, without status migrainosus: Secondary | ICD-10-CM | POA: Diagnosis present

## 2022-02-20 DIAGNOSIS — K219 Gastro-esophageal reflux disease without esophagitis: Secondary | ICD-10-CM | POA: Diagnosis present

## 2022-02-20 DIAGNOSIS — G43719 Chronic migraine without aura, intractable, without status migrainosus: Secondary | ICD-10-CM

## 2022-02-20 DIAGNOSIS — G459 Transient cerebral ischemic attack, unspecified: Secondary | ICD-10-CM

## 2022-02-20 DIAGNOSIS — R531 Weakness: Secondary | ICD-10-CM

## 2022-02-20 DIAGNOSIS — G4700 Insomnia, unspecified: Secondary | ICD-10-CM | POA: Diagnosis present

## 2022-02-20 DIAGNOSIS — Z85820 Personal history of malignant melanoma of skin: Secondary | ICD-10-CM

## 2022-02-20 DIAGNOSIS — Z7982 Long term (current) use of aspirin: Secondary | ICD-10-CM | POA: Diagnosis not present

## 2022-02-20 DIAGNOSIS — E785 Hyperlipidemia, unspecified: Secondary | ICD-10-CM | POA: Diagnosis not present

## 2022-02-20 DIAGNOSIS — G43709 Chronic migraine without aura, not intractable, without status migrainosus: Secondary | ICD-10-CM

## 2022-02-20 DIAGNOSIS — R202 Paresthesia of skin: Secondary | ICD-10-CM

## 2022-02-20 LAB — CBC
HCT: 37.5 % (ref 36.0–46.0)
Hemoglobin: 12.3 g/dL (ref 12.0–15.0)
MCH: 29.7 pg (ref 26.0–34.0)
MCHC: 32.8 g/dL (ref 30.0–36.0)
MCV: 90.6 fL (ref 80.0–100.0)
Platelets: 385 10*3/uL (ref 150–400)
RBC: 4.14 MIL/uL (ref 3.87–5.11)
RDW: 12.8 % (ref 11.5–15.5)
WBC: 5.8 10*3/uL (ref 4.0–10.5)
nRBC: 0 % (ref 0.0–0.2)

## 2022-02-20 LAB — DIFFERENTIAL
Abs Immature Granulocytes: 0.01 10*3/uL (ref 0.00–0.07)
Basophils Absolute: 0.1 10*3/uL (ref 0.0–0.1)
Basophils Relative: 1 %
Eosinophils Absolute: 0.1 10*3/uL (ref 0.0–0.5)
Eosinophils Relative: 2 %
Immature Granulocytes: 0 %
Lymphocytes Relative: 31 %
Lymphs Abs: 1.8 10*3/uL (ref 0.7–4.0)
Monocytes Absolute: 0.7 10*3/uL (ref 0.1–1.0)
Monocytes Relative: 12 %
Neutro Abs: 3.2 10*3/uL (ref 1.7–7.7)
Neutrophils Relative %: 54 %

## 2022-02-20 LAB — COMPREHENSIVE METABOLIC PANEL
ALT: 11 U/L (ref 0–44)
AST: 16 U/L (ref 15–41)
Albumin: 3.7 g/dL (ref 3.5–5.0)
Alkaline Phosphatase: 58 U/L (ref 38–126)
Anion gap: 4 — ABNORMAL LOW (ref 5–15)
BUN: 14 mg/dL (ref 8–23)
CO2: 26 mmol/L (ref 22–32)
Calcium: 9.5 mg/dL (ref 8.9–10.3)
Chloride: 108 mmol/L (ref 98–111)
Creatinine, Ser: 0.9 mg/dL (ref 0.44–1.00)
GFR, Estimated: >60 mL/min (ref >60)
Glucose, Bld: 110 mg/dL — ABNORMAL HIGH (ref 70–99)
Potassium: 4.3 mmol/L (ref 3.5–5.1)
Sodium: 138 mmol/L (ref 135–145)
Total Bilirubin: 0.6 mg/dL (ref 0.3–1.2)
Total Protein: 6.2 g/dL — ABNORMAL LOW (ref 6.5–8.1)

## 2022-02-20 LAB — PROTIME-INR
INR: 1 (ref 0.8–1.2)
Prothrombin Time: 13.4 seconds (ref 11.4–15.2)

## 2022-02-20 LAB — ECHOCARDIOGRAM COMPLETE BUBBLE STUDY
AR max vel: 2.48 cm2
AV Area VTI: 2.26 cm2
AV Area mean vel: 2.31 cm2
AV Mean grad: 2 mmHg
AV Peak grad: 3 mmHg
Ao pk vel: 0.86 m/s
Area-P 1/2: 3.36 cm2
Height: 60 in
S' Lateral: 2.2 cm
Weight: 1952 oz

## 2022-02-20 LAB — CBG MONITORING, ED: Glucose-Capillary: 101 mg/dL — ABNORMAL HIGH (ref 70–99)

## 2022-02-20 LAB — ETHANOL: Alcohol, Ethyl (B): <10 mg/dL (ref <10)

## 2022-02-20 LAB — LDL CHOLESTEROL, DIRECT: Direct LDL: 98 mg/dL (ref 0–99)

## 2022-02-20 LAB — SARS CORONAVIRUS 2 BY RT PCR: SARS Coronavirus 2 by RT PCR: NEGATIVE

## 2022-02-20 LAB — APTT: aPTT: 28 seconds (ref 24–36)

## 2022-02-20 MED ORDER — ALPRAZOLAM 0.25 MG PO TABS: 0.2500 mg | ORAL_TABLET | Freq: Every day | ORAL | Status: DC | PRN

## 2022-02-20 MED ORDER — ATORVASTATIN CALCIUM 20 MG PO TABS
80.0000 mg | ORAL_TABLET | Freq: Every day | ORAL | Status: DC
  Administered 2022-02-20 – 2022-02-22 (×3): 80 mg via ORAL
  Filled 2022-02-20 (×3): qty 4

## 2022-02-20 MED ORDER — ACETAMINOPHEN 325 MG PO TABS: 650.0000 mg | ORAL_TABLET | ORAL | Status: DC | PRN

## 2022-02-20 MED ORDER — ACETAMINOPHEN 325 MG PO TABS
650.0000 mg | ORAL_TABLET | Freq: Four times a day (QID) | ORAL | Status: DC | PRN
  Administered 2022-02-20: 650 mg via ORAL
  Filled 2022-02-20: qty 2

## 2022-02-20 MED ORDER — ACETAMINOPHEN 650 MG RE SUPP: 650.0000 mg | RECTAL | Status: DC | PRN

## 2022-02-20 MED ORDER — LABETALOL HCL 5 MG/ML IV SOLN
INTRAVENOUS | Status: AC
  Filled 2022-02-20: qty 4

## 2022-02-20 MED ORDER — ACETAMINOPHEN 325 MG PO TABS
650.0000 mg | ORAL_TABLET | ORAL | Status: DC | PRN
  Administered 2022-02-20 – 2022-02-21 (×2): 650 mg via ORAL
  Filled 2022-02-20 (×2): qty 2

## 2022-02-20 MED ORDER — ACETAMINOPHEN 160 MG/5ML PO SOLN: 650.0000 mg | ORAL | Status: DC | PRN

## 2022-02-20 MED ORDER — PANTOPRAZOLE SODIUM 40 MG PO TBEC
80.0000 mg | DELAYED_RELEASE_TABLET | Freq: Every day | ORAL | Status: DC
  Administered 2022-02-21 – 2022-02-22 (×2): 80 mg via ORAL
  Filled 2022-02-20 (×2): qty 2

## 2022-02-20 MED ORDER — LABETALOL HCL 5 MG/ML IV SOLN
5.0000 mg | INTRAVENOUS | Status: DC | PRN
Start: 2022-02-20 — End: 2022-02-22

## 2022-02-20 MED ORDER — LABETALOL HCL 5 MG/ML IV SOLN
10.0000 mg | Freq: Once | INTRAVENOUS | Status: AC
  Administered 2022-02-20: 10 mg via INTRAVENOUS

## 2022-02-20 MED ORDER — GADOBUTROL 1 MMOL/ML IV SOLN
5.0000 mL | Freq: Once | INTRAVENOUS | Status: AC | PRN
Start: 2022-02-20 — End: 2022-02-20
  Administered 2022-02-20: 5 mL via INTRAVENOUS

## 2022-02-20 MED ORDER — STROKE: EARLY STAGES OF RECOVERY BOOK: Freq: Once | Status: AC

## 2022-02-20 MED ORDER — IOHEXOL 350 MG/ML SOLN
75.0000 mL | Freq: Once | INTRAVENOUS | Status: AC | PRN
Start: 2022-02-20 — End: 2022-02-20
  Administered 2022-02-20: 75 mL via INTRAVENOUS

## 2022-02-20 MED ORDER — LABETALOL HCL 5 MG/ML IV SOLN: 5.0000 mg | INTRAVENOUS | Status: DC | PRN

## 2022-02-20 MED ORDER — ASPIRIN 300 MG RE SUPP
300.0000 mg | Freq: Every day | RECTAL | Status: DC
  Filled 2022-02-20 (×2): qty 1

## 2022-02-20 MED ORDER — SENNOSIDES-DOCUSATE SODIUM 8.6-50 MG PO TABS: 1.0000 | ORAL_TABLET | Freq: Every evening | ORAL | Status: DC | PRN

## 2022-02-20 MED ORDER — SODIUM CHLORIDE 0.9% FLUSH
3.0000 mL | Freq: Once | INTRAVENOUS | Status: AC
  Administered 2022-02-20: 3 mL via INTRAVENOUS

## 2022-02-20 MED ORDER — ASPIRIN 81 MG PO CHEW
81.0000 mg | CHEWABLE_TABLET | Freq: Every day | ORAL | Status: DC
  Administered 2022-02-20 – 2022-02-22 (×3): 81 mg via ORAL
  Filled 2022-02-20 (×3): qty 1

## 2022-02-20 MED ORDER — ENOXAPARIN SODIUM 40 MG/0.4ML IJ SOSY
40.0000 mg | PREFILLED_SYRINGE | Freq: Every day | INTRAMUSCULAR | Status: DC
  Administered 2022-02-20 – 2022-02-21 (×2): 40 mg via SUBCUTANEOUS
  Filled 2022-02-20 (×2): qty 0.4

## 2022-02-20 MED ORDER — TRAZODONE HCL 50 MG PO TABS
50.0000 mg | ORAL_TABLET | Freq: Every evening | ORAL | Status: DC | PRN
  Administered 2022-02-21 (×2): 50 mg via ORAL
  Filled 2022-02-20 (×2): qty 1

## 2022-02-20 MED ORDER — CLOPIDOGREL BISULFATE 75 MG PO TABS
75.0000 mg | ORAL_TABLET | Freq: Every day | ORAL | Status: DC
  Administered 2022-02-20 – 2022-02-22 (×3): 75 mg via ORAL
  Filled 2022-02-20 (×3): qty 1

## 2022-02-20 MED ORDER — HEPARIN SODIUM (PORCINE) 5000 UNIT/ML IJ SOLN
5000.0000 [IU] | Freq: Three times a day (TID) | INTRAMUSCULAR | Status: DC
  Administered 2022-02-20: 5000 [IU] via SUBCUTANEOUS
  Filled 2022-02-20: qty 1

## 2022-02-20 NOTE — Telephone Encounter (Signed)
Access nurse called in stating the outcome doe the patient is ER. Patient is refusing ER.

## 2022-02-20 NOTE — Assessment & Plan Note (Signed)
LDL 98 on Lipitor.

## 2022-02-20 NOTE — Code Documentation (Signed)
Stroke Response Nurse Documentation Code Documentation  Joann Buchanan is a 76 y.o. female arriving to [facility] via [facility] on 02/20/2022 with past medical hx of melanoma, migraine, and HTN. Code stroke was activated by ED.   Patient from home where she was LKW at 0830 and now complaining of headache, numbness to the left face.  Stroke team at the bedside on patient arrival. Labs drawn and patient cleared for CT by Dr. Joni Fears. Patient to CT with team. NIHSS 2, see documentation for details and code stroke times. Patient with left leg weakness and left decreased sensation on exam. The following imaging was completed:  CT Head and CTA. Patient is not a candidate for IV Thrombolytic due to too mild to treat. Patient is not a candidate for IR due to no LVO detected per MD.   Care Plan: q30 NIHSS/VS until patient outside window at 1300, then q2 NIHSS/VS.   Bedside handoff with ED RN Darlyne Russian.    Kathrin Greathouse  Stroke Response RN

## 2022-02-20 NOTE — Telephone Encounter (Signed)
Aware, in ER now

## 2022-02-20 NOTE — Hospital Course (Addendum)
[name] is a 76 year old female with medical history of GERD, insomnia, primary hypertension, migraine headaches, melanoma, anxiety, who presents emergency department for chief concerns of left side facial and left arm tingling, with left-sided headache.  Initial vitals in the emergency department showed temperature of 98.2, respiration rate of 18, heart rate of 86, blood pressure 172/95, SPO2 of 96% on room air.  Serum sodium 138, potassium 4.3, chloride 108, bicarb 26, nonfasting blood glucose 110, BUN of 14, serum creatinine of 0.90, GFR greater than 60, WBC 5.8, hemoglobin 12.3, platelets of 385.  Ethanol level was less than 10.  CT head wo contrast was read as no acute intracranial abnormality.  CTA head and neck with and without contrast material was read as: Normal CT angiography of the head and neck vessels.  No demonstrable atherosclerotic change or other vascular pathology.  No stenosis or occlusion.  ED treatment: Labetalol 10 mg IV one-time dose.  MRI of the brain with and without contrast negative for acute event.  CT angio negative for large vessel occlusion.  Echocardiogram shows normal EF.  Patient given Depakote and Decadron on the morning of 02/21/2022 for headache.  In the afternoon, ordered Compazine Benadryl and Toradol.  Neurology recommended watching overnight.

## 2022-02-20 NOTE — Telephone Encounter (Signed)
Called patient to review information. She is at ED now for evaluation and will wait to be seen.  No further action needed at this time.    [facility] - Client TELEPHONE ADVICE RECORD Access[facility]urse Patient Name: Joann Buchanan Gender: Female DOB: 1945-10-18 Age: 75 Y 77 M 27 D Return Phone Number: [phone] (Primary), [phone] (Secondary) Address: City/ State/ Zip: [place] [address] [facility] - Client Client Site [place] Provider Glori Bickers, Roque Lias - MD Contact Type Call Who Is Calling Patient / Member / Family / Caregiver Call Type Triage / Clinical Relationship To Patient Self Return Phone Number [phone] (Primary) Chief Complaint Headache Reason for Call Symptomatic / Request for [place] states has intensive headaches, states blood high blood pressure 162/96, states needs to speak with nurse. Translation No Nurse Assessment Nurse: Ottis Stain, RN, Sherrie Date/Time (Eastern Time): 02/20/2022 10:06:00 AM Confirm and document reason for call. If symptomatic, describe symptoms. ---Caller states has headaches have increased in intensity, left side of face numb now. Blood 166/109 just now. Does the patient have any new or worsening symptoms? ---Yes Will a triage be completed? ---Yes Related visit to physician within the last 2 weeks? ---No Does the PT have any chronic conditions? (i.e. diabetes, asthma, this includes High risk factors for pregnancy, etc.) ---No Is this a behavioral health or substance abuse call? ---No Guidelines Guideline Title Affirmed Question Affirmed Notes Nurse Date/Time (Eastern Time) Headache [1] Numbness of the face, arm or leg on one side of the body AND [2] new-onset Ottis Stain, RN, Sherrie 02/20/2022 10:08:13 AM Disp. Time [name] Time) Disposition Final User 02/20/2022 10:15:02 AM Call EMS 911 Now Yes Ottis Stain, RN,  Sherrie 02/20/2022 10:16:05 AM 911 Outcome Documentation Ottis Stain, RN, Sherrie Reason: Caller refuses to call 911. Refusing to go to ED. States willing PLEASE NOTE: All timestamps contained within this report are represented as [place] Standard Time. CONFIDENTIALTY NOTICE: This fax transmission is intended only for the addressee. It contains information that is legally privileged, confidential or otherwise protected from use or disclosure. If you are not the intended recipient, you are strictly prohibited from reviewing, disclosing, copying using or disseminating any of this information or taking any action in reliance on or regarding this information. If you have received this fax in error, please notify us immediately by telephone so that we can arrange for its return to [place]. Phone: [phone], Toll-Free: [phone], Fax: [phone] Page: 2 of 2 Call Id: [id number] [place]. Time (Eastern Time) Disposition Final User to go to Doctor office. Explained seriousness of symptoms. Final Disposition 02/20/2022 10:15:02 AM Call EMS 911 Now Yes Ottis Stain, RN, Sherrie Caller Disagree/Comply Disagree Caller Understands Yes PreDisposition InappropriateToAsk Care Advice Given Per Guideline CALL EMS 911 NOW: Comments User: Evlyn Clines, RN Date/Time [name] Time): 02/20/2022 10:20:22 AM Called office and spoke with [name] and explained situation and disposition. Given patient information and call back number. Referrals GO TO FACILITY REFUSED

## 2022-02-20 NOTE — ED Provider Notes (Signed)
[facility] Provider Note    Event Date/Time   First MD Initiated Contact with Patient 02/20/22 1211     (approximate)   History   Chief Complaint: Tingling   HPI  Joann Buchanan is a 76 y.o. female with a history of hyperlipidemia, GERD, hypertension, melanoma who comes the ED complaining of worsening left-sided headache for the past 3 days, not thunderclap in origin, associated with paresthesia of the left face and left arm.  She also reports subjectively that the left arm feels heavier but she denies motor weakness, loss of balance or coordination.  No trauma.  Has not had headaches like this before.  Denies fever or neck pain or stiffness     Physical Exam   Triage Vital Signs: ED Triage Vitals [02/20/22 1152]  Enc Vitals Group     BP (!) 172/95     Pulse Rate 86     Resp 18     Temp 98.2 F (36.8 C)     Temp Source Oral     SpO2 96 %     Weight 122 lb (55.3 kg)     Height 5' (1.524 m)     Head Circumference      Peak Flow      Pain Score      Pain Loc      Pain Edu?      Excl. in [place]?     Most recent vital signs: Vitals:   02/20/22 1300 02/20/22 1400  BP: (!) 183/99 (!) 170/89  Pulse: 72 73  Resp: 13 (!) 23  Temp:    SpO2: 99% 100%    General: Awake, no distress.  CV:  Good peripheral perfusion.  Regular rate rhythm Resp:  Normal effort.  Clear to auscultation bilaterally Abd:  No distention.  Other:  Cranial nerves III through XII intact.  Speech and language intact.  She does have some altered sensation of the left maxillary face.  Altered sensation of the left arm.  No drift. NIH stroke scale 2   ED Results / Procedures / Treatments   Labs (all labs ordered are listed, but only abnormal results are displayed) Labs Reviewed  COMPREHENSIVE METABOLIC PANEL - Abnormal; Notable for the following components:      Result Value   Glucose, Bld 110 (*)    Total Protein 6.2 (*)    Anion gap 4 (*)    All other components within  normal limits  CBG MONITORING, ED - Abnormal; Notable for the following components:   Glucose-Capillary 101 (*)    All other components within normal limits  PROTIME-INR  APTT  CBC  DIFFERENTIAL  ETHANOL  LDL CHOLESTEROL, DIRECT  HEMOGLOBIN A1C  I-STAT CREATININE, ED     EKG Interpreted by me Sinus rhythm rate of 88.  Normal axis, normal intervals.  Poor R wave progression.  Normal ST segments and T waves.   RADIOLOGY CT head interpreted by me, negative for intracranial hemorrhage or mass.  Radiology report reviewed.  CT angiogram head and neck unremarkable.   PROCEDURES:  .Critical Care  Performed by: Carrie Mew, MD Authorized by: Carrie Mew, MD   Critical care provider statement:    Critical care time (minutes):  35   Critical care time was exclusive of:  Separately billable procedures and treating other patients   Critical care was necessary to treat or prevent imminent or life-threatening deterioration of the following conditions:  CNS failure or compromise   Critical  care was time spent personally by me on the following activities:  Development of treatment plan with patient or surrogate, discussions with consultants, evaluation of patient's response to treatment, examination of patient, obtaining history from patient or surrogate, ordering and performing treatments and interventions, ordering and review of laboratory studies, ordering and review of radiographic studies, pulse oximetry, re-evaluation of patient's condition and review of old charts   Care discussed with: admitting provider      MEDICATIONS ORDERED IN ED: Medications  atorvastatin (LIPITOR) tablet 80 mg (80 mg Oral Given 02/20/22 1349)  clopidogrel (PLAVIX) tablet 75 mg (75 mg Oral Given 02/20/22 1349)  aspirin chewable tablet 81 mg (81 mg Oral Given 02/20/22 1349)    Or  aspirin suppository 300 mg ( Rectal See Alternative 02/20/22 1349)  acetaminophen (TYLENOL) tablet 650 mg (650 mg Oral  Given 02/20/22 1402)  labetalol (NORMODYNE) injection 5 mg (has no administration in time range)   stroke: early stages of recovery book (has no administration in time range)  acetaminophen (TYLENOL) tablet 650 mg (has no administration in time range)    Or  acetaminophen (TYLENOL) 160 MG/5ML solution 650 mg (has no administration in time range)    Or  acetaminophen (TYLENOL) suppository 650 mg (has no administration in time range)  senna-docusate (Senokot-S) tablet 1 tablet (has no administration in time range)  traZODone (DESYREL) tablet 50-100 mg (has no administration in time range)  pantoprazole (PROTONIX) EC tablet 80 mg (has no administration in time range)  ALPRAZolam (XANAX) tablet 0.25 mg (has no administration in time range)  enoxaparin (LOVENOX) injection 40 mg (has no administration in time range)  sodium chloride flush (NS) 0.9 % injection 3 mL (3 mLs Intravenous Given 02/20/22 1244)  iohexol (OMNIPAQUE) 350 MG/ML injection 75 mL (75 mLs Intravenous Contrast Given 02/20/22 1225)  labetalol (NORMODYNE) injection 10 mg (10 mg Intravenous Given 02/20/22 1243)     IMPRESSION / MDM / [place] / ED COURSE  I reviewed the triage vital signs and the nursing notes.                              Differential diagnosis includes, but is not limited to, ischemic stroke, intracranial hemorrhage, intracranial mass, complicated migraine  Patient's presentation is most consistent with acute presentation with potential threat to life or bodily function.  Patient presents with acute onset of neurologic symptoms, last known well 8:30 AM today, within thrombolysis window for acute ischemic stroke.  Code stroke initiated on assessment in triage by myself.  Airway is intact, patient is alert.   Clinical Course as of 02/20/22 1420  Tue Feb 20, 2022  1233 D/w Neuro Dr. Quinn Axe. NIHSS 2, too mild to treat with TNK. Will plan to admit for stroke workup [PS]    Clinical Course User  Index [PS] Carrie Mew, MD     FINAL CLINICAL IMPRESSION(S) / ED DIAGNOSES   Final diagnoses:  TIA (transient ischemic attack)     Rx / DC Orders   ED Discharge Orders     None        Note:  This document was prepared using Dragon voice recognition software and may include unintentional dictation errors.   Carrie Mew, MD 02/20/22 1420

## 2022-02-20 NOTE — Assessment & Plan Note (Signed)
Continue home trazodone 50-100 mg nightly as needed for sleep

## 2022-02-20 NOTE — Assessment & Plan Note (Addendum)
TIA versus complex migraine with neurological symptoms.  MRI of the brain negative.  Patient given Depakote and Decadron this morning.  This afternoon given Compazine Benadryl and Toradol.  Will need neurology follow-up as outpatient.  Currently on aspirin Plavix and Lipitor.  On medications for headache.  Sedimentation rate 9 which rules out temporal arteritis.

## 2022-02-20 NOTE — ED Triage Notes (Signed)
Patient to ED via POV for left side face tingling that started "around 8am or 8:30." Patient also c/o headache and hypertension. Patient speaking in full sentences without slurred speech. No weakness in extremities.   Code Stroke Called by Joni Fears, MD in triage at this time.

## 2022-02-20 NOTE — Progress Notes (Signed)
  Chaplain On-Call responded to Code Stroke notification for ED room 5.  Patient was already at the CT Scan area for procedure.  Chaplain remains available if additional support is requested.  Chaplain Remo Lipps., [facility]

## 2022-02-20 NOTE — H&P (Addendum)
History and Physical   Joann Buchanan:[id number] DOB: [birth year] DOA: 02/20/2022  PCP: Abner Greenspan, MD  Specialists provider: Dr. Rockey Situ, [facility] cardiology Patient coming from: Home  I have personally briefly reviewed patient's old medical records in [place].  Chief Concern: Left face and left arm tingling  HPI: [name] is a 76 year old female with medical history of GERD, insomnia, primary hypertension, migraine headaches, melanoma, anxiety, who presents emergency department for chief concerns of left side facial and left arm tingling, with left-sided headache.  Initial vitals in the emergency department showed temperature of 98.2, respiration rate of 18, heart rate of 86, blood pressure 172/95, SPO2 of 96% on room air.  Serum sodium 138, potassium 4.3, chloride 108, bicarb 26, nonfasting blood glucose 110, BUN of 14, serum creatinine of 0.90, GFR greater than 60, WBC 5.8, hemoglobin 12.3, platelets of 385.  Ethanol level was less than 10.  CT head wo contrast was read as no acute intracranial abnormality.  CTA head and neck with and without contrast material was read as: Normal CT angiography of the head and neck vessels.  No demonstrable atherosclerotic change or other vascular pathology.  No stenosis or occlusion.  ED treatment: Labetalol 10 mg IV one-time dose.  At bedside patient was able to tell me her full name, her age, she knows she is in the hospital and she is able to identify her daughter at bedside.  Last evening, she was out with friends and she was having a bad headache, left side. She stopped at [facility] to check her BP, 192/97. It came down to 147/101 three hours later.  She had small gyro sandwich for dinner with her friends. This AM, she woke up and her head continued to hurt.  She checked her BP and it was 162/96. She called her pcp office and the triage nurse advised her to come to ED. During the phone call with the triage nurse, she experienced left  face tingling and numbness.  She denies difficulty speaking or slurring of speech.  She then reports that the headache started on Saturday worse on Sunday and continued on Monday.   She denies changes to her vision. She denies fever, chest pain, shortness of breath, dysuria, hematuria, diarrhea. Her last BM was yesterday, 02/19/22 and it was constipated.   Social history: She lives by herself. She denies history of tobacco, etoh, and recreational drug use. 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   ROS: Constitutional: no weight change, no fever ENT/Mouth: no sore throat, no rhinorrhea Eyes: no eye pain, no vision changes Cardiovascular: no chest pain, no dyspnea,  no edema, no palpitations Respiratory: no cough, no sputum, no wheezing Gastrointestinal: no nausea, no vomiting, no diarrhea, no constipation Genitourinary: no urinary incontinence, no dysuria, no hematuria Musculoskeletal: no arthralgias, no myalgias Skin: no skin lesions, no pruritus, Neuro: no weakness, no loss of consciousness, no syncope, + headache Psych: no anxiety, no depression, + decrease appetite Heme/Lymph: no bruising, no bleeding  ED Course: Discussed with emergency medicine provider, patient requiring hospitalization for chief concerns of stroke/TIA.  Assessment/Plan  Principal Problem:   TIA (transient ischemic attack) Active Problems:   History of melanoma   Essential hypertension   GERD   Hyperlipidemia, mild   Insomnia   Assessment and Plan:  * TIA (transient ischemic attack) Stroke-like symptoms - Neurology has been consulted and we appreciate further recommendations - Complete echo with bubble study ordered - MRI  brain with and without contrast (with and without secondary to remote history of melanoma and worsening headache and a woman greater than 11 years old) - A1c and LDL cholesterol ordered per neurology - Permissive hypertension per neurology  recommendations - Frequent neuro vascular checks - PT, OT - Fall precaution  Insomnia - Resumed home trazodone 50-100 mg nightly as needed for sleep  Hyperlipidemia, mild - Atorvastatin 80 mg daily resumed  GERD - PPI  History of melanoma - MRI brain with and without contrast material  History of headaches-no Imitrex or other headache medication that causes vasoconstriction, Imitrex has not been resumed on admission  Chart reviewed.   DVT prophylaxis: Enoxaparin Code Status: full code Diet: Heart healthy diet Family Communication: Updated daughter, Clarene Buchanan at bedside Disposition Plan: Pending clinical course Consults called: Neurology Admission status: Telemetry medical, observation  Past Medical History:  Diagnosis Date   Allergic rhinitis    GERD (gastroesophageal reflux disease)    Goiter    ?    HTN (hypertension)    Hx of fibrocystic disease of breast    Melanoma ([place])    Migraine    Numbness and tingling    bilateral hands   Osteoarthritis of hand    PONV (postoperative nausea and vomiting)    Past Surgical History:  Procedure Laterality Date   ANTERIOR CERVICAL DECOMP/DISCECTOMY FUSION N/A 12/02/2015   Procedure: ANTERIOR CERVICAL DECOMPRESSION/DISCECTOMY FUSION CERVICAL FOUR-FIVE;  Surgeon: Eustace Moore, MD;  Location: [place] [facility];  Service: Neurosurgery;  Laterality: N/A;   APPENDECTOMY     BREAST BIOPSY     normal   CATARACT EXTRACTION Right Jan 2017   CATARACT EXTRACTION W/ INTRAOCULAR LENS IMPLANT Left 2018   DEXA  7/07   negative   EMG     left upper arm- neg   ESOPHAGOGASTRODUODENOSCOPY     normal-24 hour PH probe (-)   EYE SURGERY  10/09   Left eye; for double vision   PARTIAL HYSTERECTOMY     Ovaries intact   TUBAL LIGATION     Social History:  reports that she has never smoked. She has never used smokeless tobacco. She reports that she does not drink alcohol and does not use drugs.  Allergies  Allergen Reactions   Sulfonamide  Derivatives Anaphylaxis and Other (See Comments)    REACTION: unspecified Hives and swelling   Family History  Problem Relation Age of Onset   Breast cancer Mother        Died, 38   Other Father        Died, 78 - tree fell on him   Breast cancer Sister        Died, 4   Heart disease Sister        Died, 63   Healthy Son    Healthy Daughter    Family history: Family history reviewed and not pertinent.  Prior to Admission medications   Medication Sig Start Date End Date Taking? Authorizing Provider  ALPRAZolam (XANAX) 0.25 MG tablet Take 1 tablet (0.25 mg total) by mouth as needed. Prior to flights 12/14/20   Abner Greenspan, MD  Aspirin-Salicylamide-Caffeine 542-70-62 MG TABS Take 1 packet by mouth daily as needed (for headache).    [provider]  famotidine (PEPCID) 20 MG tablet Take 1 tablet (20 mg total) by mouth 2 (two) times daily. 08/30/21   Tower, Wynelle Fanny, MD  fluticasone (FLONASE) 50 MCG/ACT nasal spray SPRAY 2 SPRAYS INTO EACH NOSTRIL EVERY DAY 12/27/21  Tower, Marne A, MD  ibuprofen (ADVIL,MOTRIN) 200 MG tablet 200 mg.    [provider]  methocarbamol (ROBAXIN) 500 MG tablet TAKE 1 TABLET (500 MG TOTAL) BY MOUTH 3 (THREE) TIMES DAILY AS NEEDED (HEADACHE). 06/07/21   Tower, Wynelle Fanny, MD  omeprazole (PRILOSEC) 40 MG capsule Take 1 capsule (40 mg total) by mouth daily. 08/25/21   Tower, Wynelle Fanny, MD  Polyethyl Glycol-Propyl Glycol 0.4-0.3 % SOLN Place 1 drop into both eyes as needed.    [provider]  SUMAtriptan (IMITREX) 20 MG/ACT nasal spray USE 1 SPRAY INTO THE NOSE EVERY 2 HOURS AS NEEDED FOR MIGRAINE. NO MORE THAN 2 DOSES IN 24 HOURS 11/28/21   Tower, Wynelle Fanny, MD  traZODone (DESYREL) 50 MG tablet Take 1-2 tablets (50-100 mg total) by mouth at bedtime as needed. for sleep 08/25/21   Abner Greenspan, MD   Physical Exam: Vitals:   02/20/22 1239 02/20/22 1245 02/20/22 1300 02/20/22 1400  BP: (!) 200/93 (!) 174/97 (!) 183/99 (!) 170/89  Pulse: 88 77 72  73  Resp: '12 15 13 '$ (!) 23  Temp:      TempSrc:      SpO2: 100% 100% 99% 100%  Weight:      Height:       Constitutional: appears age-appropriate, NAD, calm, comfortable Eyes: PERRL, lids and conjunctivae normal ENMT: Mucous membranes are moist. Posterior pharynx clear of any exudate or lesions. Age-appropriate dentition. Hearing appropriate Neck: normal, supple, no masses, no thyromegaly Respiratory: clear to auscultation bilaterally, no wheezing, no crackles. Normal respiratory effort. No accessory muscle use.  Cardiovascular: Regular rate and rhythm, no murmurs / rubs / gallops. No extremity edema. 2+ pedal pulses. No carotid bruits.  Abdomen: no tenderness, no masses palpated, no hepatosplenomegaly. Bowel sounds positive.  Musculoskeletal: no clubbing / cyanosis. No joint deformity upper and lower extremities. Good ROM, no contractures, no atrophy. Normal muscle tone.  Skin: no rashes, lesions, ulcers. No induration Neurologic: Sensation intact. Strength 5/5 in all 4.  Psychiatric: Normal judgment and insight. Alert and oriented x 3. Normal mood.   EKG: independently reviewed, showing sinus rhythm with rate of 88, QTc 449  Chest x-ray on Admission: Not indicated at this time  CT ANGIO HEAD NECK W WO CM (CODE STROKE)  Result Date: 02/20/2022 CLINICAL DATA:  Neuro deficit, acute, stroke suspected. Left-sided weakness and numbness. Question left visual neglect. Symptoms began this morning. EXAM: CT ANGIOGRAPHY HEAD AND NECK TECHNIQUE: Multidetector CT imaging of the head and neck was performed using the standard protocol during bolus administration of intravenous contrast. Multiplanar CT image reconstructions and MIPs were obtained to evaluate the vascular anatomy. Carotid stenosis measurements (when applicable) are obtained utilizing NASCET criteria, using the distal internal carotid diameter as the denominator. RADIATION DOSE REDUCTION: This exam was performed according to the  departmental dose-optimization program which includes automated exposure control, adjustment of the mA and/or kV according to patient size and/or use of iterative reconstruction technique. CONTRAST:  43m OMNIPAQUE IOHEXOL 350 MG/ML SOLN COMPARISON:  Head CT same day FINDINGS: CTA NECK FINDINGS Aortic arch: No aortic atherosclerotic calcification. No aneurysm or dissection. Branching pattern is normal without origin stenosis. Right carotid system: Common carotid artery widely patent to the bifurcation. Normal carotid bifurcation without soft or calcified plaque. Cervical ICA widely patent. Left carotid system: Left carotid system similarly normal. Vertebral arteries: Both vertebral artery origins are widely patent. Both vertebral arteries are normal through the cervical region to the foramen magnum. Skeleton: Distant  ACDF C4-5.  Chronic cervical facet osteoarthritis. Other neck: No mass or lymphadenopathy. Upper chest: Normal Review of the MIP images confirms the above findings CTA HEAD FINDINGS Anterior circulation: Both internal carotid arteries are widely patent through the skull base and siphon regions. No siphon stenosis. The anterior and middle cerebral vessels are patent. No large vessel occlusion or proximal stenosis. No aneurysm or vascular malformation. Posterior circulation: Both vertebral arteries are widely patent to the basilar. No basilar stenosis. Posterior circulation branch vessels are normal. Venous sinuses: Patent and normal. Anatomic variants: None significant. Review of the MIP images confirms the above findings IMPRESSION: Normal CT angiography of the head and neck vessels. No demonstrable atherosclerotic change or other vascular pathology. No stenosis or occlusion. Electronically Signed   By: Nelson Chimes M.D.   On: 02/20/2022 12:45   CT HEAD CODE STROKE WO CONTRAST  Result Date: 02/20/2022 CLINICAL DATA:  Code stroke.  Acute neuro deficit.  Rule out stroke EXAM: CT HEAD WITHOUT CONTRAST  TECHNIQUE: Contiguous axial images were obtained from the base of the skull through the vertex without intravenous contrast. RADIATION DOSE REDUCTION: This exam was performed according to the departmental dose-optimization program which includes automated exposure control, adjustment of the mA and/or kV according to patient size and/or use of iterative reconstruction technique. COMPARISON:  None Available. FINDINGS: Brain: No evidence of acute infarction, hemorrhage, hydrocephalus, extra-axial collection or mass lesion/mass effect. Vascular: Negative for hyperdense vessel Skull: Negative Sinuses/Orbits: Paranasal sinuses clear. Bilateral cataract extraction. Mild mastoid effusion bilaterally Other: None ASPECTS ([place] Stroke Program Early CT Score) - Ganglionic level infarction (caudate, lentiform nuclei, internal capsule, insula, M1-M3 cortex): 7 - Supraganglionic infarction (M4-M6 cortex): 3 Total score (0-10 with 10 being normal): 10 IMPRESSION: 1. No acute intracranial abnormality. 2. ASPECTS is 10 3. Code stroke imaging results were communicated on 02/20/2022 at 12:16 pm to provider Quinn Axe via [facility] app text Electronically Signed   By: Franchot Gallo M.D.   On: 02/20/2022 12:16    Labs on Admission: I have personally reviewed following labs  CBC: Recent Labs  Lab 02/20/22 1235  WBC 5.8  NEUTROABS 3.2  HGB 12.3  HCT 37.5  MCV 90.6  PLT 825   Basic Metabolic Panel: Recent Labs  Lab 02/20/22 1235  NA 138  K 4.3  CL 108  CO2 26  GLUCOSE 110*  BUN 14  CREATININE 0.90  CALCIUM 9.5   GFR: Estimated Creatinine Clearance: 42.8 mL/min (by C-G formula based on SCr of 0.9 mg/dL). Liver Function Tests: Recent Labs  Lab 02/20/22 1235  AST 16  ALT 11  ALKPHOS 58  BILITOT 0.6  PROT 6.2*  ALBUMIN 3.7   Coagulation Profile: Recent Labs  Lab 02/20/22 1235  INR 1.0   CBG: Recent Labs  Lab 02/20/22 1216  GLUCAP 101*   Urine analysis:    Component Value Date/Time   COLORURINE  lt. yellow 12/05/2006 1511   APPEARANCEUR Cloudy 12/05/2006 1511   LABSPEC 1.020 12/05/2006 1511   PHURINE 5.0 12/05/2006 1511   HGBUR large 12/05/2006 1511   BILIRUBINUR negative 12/05/2006 1511   UROBILINOGEN 0.2 12/05/2006 1511   NITRITE negative 12/05/2006 1511   Dr. Tobie Poet [facility]  If 7PM-7AM, please contact overnight-coverage provider If 7AM-7PM, please contact day coverage provider [email or web address]  02/20/2022, 2:32 PM

## 2022-02-20 NOTE — Assessment & Plan Note (Signed)
PPI ?

## 2022-02-20 NOTE — Telephone Encounter (Signed)
In ER, watching for correspondence

## 2022-02-20 NOTE — Consult Note (Addendum)
NEUROLOGY CONSULTATION NOTE   Date of service: February 20, 2022 Patient Name: Joann Buchanan MRN:  [id number] DOB:  04-Jul-1946 Reason for consult: stroke code Requesting physician: Dr. Carrie Mew _ _ _   _ __   _ __ _ _  __ __   _ __   __ _  History of Present Illness   This is a 76 year old woman with past medical history significant for hypertension, remote history of melanoma in the 1980s in remission to her knowledge, migraine who presents with 3 days of headache and new onset left-sided weakness and numbness.  Last known well 830 this morning.  After that she developed tingling and numbness in the left side of her face and also slightly in her distal left upper extremity.  She reported that her left arm felt heavy although there is no drift of there on exam.  She did have drift in her left leg.  She seemed to have some oculomotor apraxia but no clear visual field deficit.  She was fully oriented without aphasia but just reported some subjective confusion.  NIH stroke scale was 2.  CT head showed no acute intracranial process per (personal review).  Symptoms were judged to mild to treat with thrombolytics.  Due to her headache a CTA head and neck was performed as part of the stroke code which showed no hemodynamically significant stenosis, no aneurysm, no vascular malformation identified.  Her headache began on Saturday and has been at its peak and 8 out of 10 although it was not maximal at onset.  This feels different from her prior migraines due to a stronger sense of fullness, positional features (feels better when she lays down), is aggravated by movement, and photophobia is more pronounced.  Her headaches do not typically last for 3 days.  She typically has 1-2 headache days per week which respond well at home to imitrex.    ROS   Per HPI: all other systems reviewed and are negative  Past History   I have reviewed the following:  Past Medical History:  Diagnosis Date   Allergic  rhinitis    GERD (gastroesophageal reflux disease)    Goiter    ?    HTN (hypertension)    Hx of fibrocystic disease of breast    Melanoma ([place])    Migraine    Numbness and tingling    bilateral hands   Osteoarthritis of hand    PONV (postoperative nausea and vomiting)    Past Surgical History:  Procedure Laterality Date   ANTERIOR CERVICAL DECOMP/DISCECTOMY FUSION N/A 12/02/2015   Procedure: ANTERIOR CERVICAL DECOMPRESSION/DISCECTOMY FUSION CERVICAL FOUR-FIVE;  Surgeon: Eustace Moore, MD;  Location: [facility];  Service: Neurosurgery;  Laterality: N/A;   APPENDECTOMY     BREAST BIOPSY     normal   CATARACT EXTRACTION Right Jan 2017   CATARACT EXTRACTION W/ INTRAOCULAR LENS IMPLANT Left 2018   DEXA  7/07   negative   EMG     left upper arm- neg   ESOPHAGOGASTRODUODENOSCOPY     normal-24 hour PH probe (-)   EYE SURGERY  10/09   Left eye; for double vision   PARTIAL HYSTERECTOMY     Ovaries intact   TUBAL LIGATION     Family History  Problem Relation Age of Onset   Breast cancer Mother        Died, 73   Other Father        Died, 29 - tree fell  on him   Breast cancer Sister        Died, 35   Heart disease Sister        Died, 68   Healthy Son    Healthy Daughter    Social History   Socioeconomic History   Marital status: Widowed    Spouse name: Not on file   Number of children: 2   Years of education: Not on file   Highest education level: Not on file  Occupational History   Occupation: Retired    Fish farm manager: UNEMPLOYED  Tobacco Use   Smoking status: Never   Smokeless tobacco: Never  Substance and Sexual Activity   Alcohol use: No    Alcohol/week: 0.0 standard drinks of alcohol   Drug use: No   Sexual activity: Never  Other Topics Concern   Not on file  Social History Narrative   Lives with husband.  Retired Water quality scientist for [employer]   Married; 2 children; 3 grand children      Social Determinants of Health   Financial Resource Strain:  Low Risk  (08/25/2021)   Overall Financial Resource Strain (CARDIA)    Difficulty of Paying Living Expenses: Not hard at all  Food Insecurity: No Food Insecurity (08/25/2021)   Hunger Vital Sign    Worried About Running Out of Food in the Last Year: Never true    Ran Out of Food in the Last Year: Never true  Transportation Needs: No Transportation Needs (08/25/2021)   PRAPARE - Hydrologist (Medical): No    Lack of Transportation (Non-Medical): No  Physical Activity: Inactive (08/25/2021)   Exercise Vital Sign    Days of Exercise per Week: 0 days    Minutes of Exercise per Session: 0 min  Stress: No Stress Concern Present (08/25/2021)   [place]    Feeling of Stress : Not at all  Social Connections: [place] (08/25/2021)   Social Connection and Isolation Panel [NHANES]    Frequency of Communication with Friends and Family: More than three times a week    Frequency of Social Gatherings with Friends and Family: More than three times a week    Attends Religious Services: More than 4 times per year    Active Member of [employer] or Organizations: Yes    Attends Music therapist: More than 4 times per year    Marital Status: Married   Allergies  Allergen Reactions   Sulfonamide Derivatives Anaphylaxis and Other (See Comments)    REACTION: unspecified Hives and swelling    Medications   (Not in a hospital admission)     Current Facility-Administered Medications:    sodium chloride flush (NS) 0.9 % injection 3 mL, 3 mL, Intravenous, Once, Carrie Mew, MD  Current Outpatient Medications:    ALPRAZolam (XANAX) 0.25 MG tablet, Take 1 tablet (0.25 mg total) by mouth as needed. Prior to flights, Disp: 10 tablet, Rfl: 1   Aspirin-Salicylamide-Caffeine 161-09-60 MG TABS, Take 1 packet by mouth daily as needed (for headache)., Disp: , Rfl:    famotidine (PEPCID) 20 MG  tablet, Take 1 tablet (20 mg total) by mouth 2 (two) times daily., Disp: 60 tablet, Rfl: 3   fluticasone (FLONASE) 50 MCG/ACT nasal spray, SPRAY 2 SPRAYS INTO EACH NOSTRIL EVERY DAY, Disp: 16 mL, Rfl: 6   ibuprofen (ADVIL,MOTRIN) 200 MG tablet, 200 mg., Disp: , Rfl:    methocarbamol (ROBAXIN) 500 MG tablet, TAKE  1 TABLET (500 MG TOTAL) BY MOUTH 3 (THREE) TIMES DAILY AS NEEDED (HEADACHE)., Disp: 30 tablet, Rfl: 2   omeprazole (PRILOSEC) 40 MG capsule, Take 1 capsule (40 mg total) by mouth daily., Disp: 30 capsule, Rfl: 11   Polyethyl Glycol-Propyl Glycol 0.4-0.3 % SOLN, Place 1 drop into both eyes as needed., Disp: , Rfl:    SUMAtriptan (IMITREX) 20 MG/ACT nasal spray, USE 1 SPRAY INTO THE NOSE EVERY 2 HOURS AS NEEDED FOR MIGRAINE. NO MORE THAN 2 DOSES IN 24 HOURS, Disp: 6 each, Rfl: 3   traZODone (DESYREL) 50 MG tablet, Take 1-2 tablets (50-100 mg total) by mouth at bedtime as needed. for sleep, Disp: 60 tablet, Rfl: 11  Vitals   Vitals:   02/20/22 1152  BP: (!) 172/95  Pulse: 86  Resp: 18  Temp: 98.2 F (36.8 C)  TempSrc: Oral  SpO2: 96%  Weight: 55.3 kg  Height: 5' (1.524 m)     Body mass index is 23.83 kg/m.  Physical Exam   Physical Exam Gen: A&O x4, NAD HEENT: Atraumatic, normocephalic;mucous membranes moist; oropharynx clear, tongue without atrophy or fasciculations. Neck: Supple, trachea midline. Resp: CTAB, no w/r/r CV: RRR, no m/g/r; nml S1 and S2. 2+ symmetric peripheral pulses. Abd: soft/NT/ND; nabs x 4 quad Extrem: Nml bulk; no cyanosis, clubbing, or edema.  Neuro: *MS: A&O x4. Follows multi-step commands.  *Speech: fluid, nondysarthric, able to name and repeat *CN:    I: Deferred   II,III: PERRLA, no clearly reproducible visual field deficits, optic discs unable to be visualized 2/2 pupillary constriction   III,IV,VI: EOMI w/ some oculomotor apraxia with impaired pursuit and saccades, no nystagmus, no ptosis   V: impaired to LT L face   VII: Eyelid closure  was full.  Smile symmetric.   VIII: Hearing intact to voice   IX,X: Voice normal, palate elevates symmetrically    XI: SCM/trap 5/5 bilat   XII: Tongue protrudes midline, no atrophy or fasciculations   *Motor:   Normal bulk.  No tremor, rigidity or bradykinesia. All extremities full strength throughout except subtle drift LLE. Equal grip strength bilat. *Sensory: Sensory impairment to LLE and LUE, mild and poorly reproducible. Otherwise intact to all modalities. No extinction to DSS. *Coordination:  Finger-to-nose, heel-to-shin, rapid alternating motions were intact. *Reflexes:  2+ and symmetric throughout without clonus; toes down-going bilat *Gait: deferred  NIHSS  1a Level of Conscious.: 0 1b LOC Questions: 0 1c LOC Commands: 0 2 Best Gaze: 0 3 Visual: 0 4 Facial Palsy: 0 5a Motor Arm - left: 0 5b Motor Arm - Right: 0 6a Motor Leg - Left: 1 6b Motor Leg - Right: 0 7 Limb Ataxia: 0 8 Sensory: 1 9 Best Language: 0 10 Dysarthria: 0 11 Extinct. and Inatten.: 0  TOTAL: 2   Premorbid mRS = 0   Labs   CBC: No results for input(s): "WBC", "NEUTROABS", "HGB", "HCT", "MCV", "PLT" in the last 168 hours.  Basic Metabolic Panel:  Lab Results  Component Value Date   NA 142 08/25/2021   K 4.8 08/25/2021   CO2 25 08/25/2021   GLUCOSE 97 08/25/2021   BUN 16 08/25/2021   CREATININE 0.97 08/25/2021   CALCIUM 10.7 (H) 08/25/2021   GFRNONAA 54 (L) 08/13/2017   GFRAA >60 08/13/2017   Lipid Panel:  Lab Results  Component Value Date   LDLCALC 120 (H) 08/25/2021   HgbA1c:  Lab Results  Component Value Date   HGBA1C 5.4 08/25/2021   Urine Drug Screen:  No results found for: "LABOPIA", "COCAINSCRNUR", "LABBENZ", "AMPHETMU", "THCU", "LABBARB"  Alcohol Level No results found for: "ETH"   Impression   This is a 76 year old woman with past medical history significant for hypertension, remote history of melanoma in the 1980s in remission to her knowledge, migraine who presents  with 3 days of headache and new onset left-sided weakness and numbness.  Ddx includes stroke/TIA, hypertensive emergency, or atypical migraine (primary or 2/2 other intracranial or systemic process). NIHSS = 2 and sx were deemed to mild to treat. CT head and CTA head and neck were unremarkable.  Recommendations   - Admit for stroke workup - NO IMITREX OR OTHER HEADACHE TX THAT CAUSES VASOCONSTRICTION. Tylenol prn ordered for now. - Permissive HTN x48 hrs from sx onset or until stroke ruled out by MRI goal BP <180/105. PRN labetalol (ordered) or hydralazine if BP above these parameters. Avoid oral antihypertensives. - MRI brain with and without contrast (wwo 2/2 remote hx melanoma and worsening headache in woman >35 yo) - CTA H&N already obtained - TTE w/ bubble - Check A1c and LDL + add statin per guidelines - ASA '81mg'$  daily + plavix '75mg'$  daily x21 days f/b ASA '81mg'$  daily monotherapy after that - q4 hr neuro checks - STAT head CT for any change in neuro exam - Tele - PT/OT/SLP - Stroke education - Amb referral to neurology upon discharge   Will continue to follow  ______________________________________________________________________   Thank you for the opportunity to take part in the care of this patient. If you have any further questions, please contact the neurology consultation attending.  Signed,  Su Monks, MD [facility] [phone]  If 7pm- 7am, please page neurology on call as listed in [place].

## 2022-02-20 NOTE — Telephone Encounter (Signed)
Patient called in stating she has been having headaches and feel like her brain is swollen. Stated her last few blood pressure readings have been 192/97, 147/101, 138/94, and 162/96 this morning. Sent over to triage.

## 2022-02-20 NOTE — Progress Notes (Signed)
*  PRELIMINARY RESULTS* Echocardiogram 2D Echocardiogram has been performed.  Sherrie Sport 02/20/2022, 2:19 PM

## 2022-02-20 NOTE — Assessment & Plan Note (Signed)
-   MRI brain with and without contrast material

## 2022-02-21 ENCOUNTER — Observation Stay: Payer: POS

## 2022-02-21 DIAGNOSIS — K219 Gastro-esophageal reflux disease without esophagitis: Secondary | ICD-10-CM

## 2022-02-21 DIAGNOSIS — G4440 Drug-induced headache, not elsewhere classified, not intractable: Secondary | ICD-10-CM | POA: Diagnosis not present

## 2022-02-21 DIAGNOSIS — G43901 Migraine, unspecified, not intractable, with status migrainosus: Secondary | ICD-10-CM

## 2022-02-21 DIAGNOSIS — Z85820 Personal history of malignant melanoma of skin: Secondary | ICD-10-CM | POA: Diagnosis not present

## 2022-02-21 DIAGNOSIS — G4700 Insomnia, unspecified: Secondary | ICD-10-CM | POA: Diagnosis not present

## 2022-02-21 DIAGNOSIS — G43811 Other migraine, intractable, with status migrainosus: Secondary | ICD-10-CM

## 2022-02-21 DIAGNOSIS — G459 Transient cerebral ischemic attack, unspecified: Secondary | ICD-10-CM | POA: Diagnosis not present

## 2022-02-21 LAB — HEMOGLOBIN A1C
Hgb A1c MFr Bld: 5.1 % (ref 4.8–5.6)
Mean Plasma Glucose: 99.67 mg/dL

## 2022-02-21 LAB — SEDIMENTATION RATE: Sed Rate: 9 mm/hr (ref 0–30)

## 2022-02-21 MED ORDER — DEXAMETHASONE SODIUM PHOSPHATE 10 MG/ML IJ SOLN
10.0000 mg | Freq: Once | INTRAMUSCULAR | Status: AC
  Administered 2022-02-21: 10 mg via INTRAVENOUS
  Filled 2022-02-21: qty 1

## 2022-02-21 MED ORDER — VALPROATE SODIUM 100 MG/ML IV SOLN
500.0000 mg | Freq: Once | INTRAVENOUS | Status: AC
  Administered 2022-02-21: 500 mg via INTRAVENOUS
  Filled 2022-02-21: qty 5

## 2022-02-21 MED ORDER — IBUPROFEN 400 MG PO TABS
400.0000 mg | ORAL_TABLET | Freq: Once | ORAL | Status: AC
Start: 2022-02-21 — End: 2022-02-21
  Administered 2022-02-21: 400 mg via ORAL
  Filled 2022-02-21: qty 1

## 2022-02-21 MED ORDER — DIPHENHYDRAMINE HCL 50 MG/ML IJ SOLN
12.5000 mg | Freq: Once | INTRAMUSCULAR | Status: AC
  Administered 2022-02-21: 12.5 mg via INTRAVENOUS
  Filled 2022-02-21: qty 1

## 2022-02-21 MED ORDER — IOHEXOL 350 MG/ML SOLN
75.0000 mL | Freq: Once | INTRAVENOUS | Status: AC | PRN
  Administered 2022-02-21: 75 mL via INTRAVENOUS

## 2022-02-21 MED ORDER — SODIUM CHLORIDE 0.9 % IV BOLUS
500.0000 mL | Freq: Once | INTRAVENOUS | Status: AC
  Administered 2022-02-21: 500 mL via INTRAVENOUS

## 2022-02-21 MED ORDER — KETOROLAC TROMETHAMINE 15 MG/ML IJ SOLN
7.5000 mg | Freq: Once | INTRAMUSCULAR | Status: AC
  Administered 2022-02-21: 7.5 mg via INTRAVENOUS
  Filled 2022-02-21: qty 1

## 2022-02-21 MED ORDER — PROCHLORPERAZINE EDISYLATE 10 MG/2ML IJ SOLN
10.0000 mg | Freq: Once | INTRAMUSCULAR | Status: AC
  Administered 2022-02-21: 10 mg via INTRAVENOUS
  Filled 2022-02-21: qty 2

## 2022-02-21 NOTE — Progress Notes (Signed)
S: L sided weakness and numbness have resolved. Headache persists. Some improvement after VPA+dexamethasone this AM but headache returned to previous level this afternoon. Patient reports she takes motrin at least once a day for headaches and has done so for yrs.  MRI brain wwo neg for acute infarct, malignancy, or other significant findings  TTE: normal EF, grade I diastolic dysfunction, no intracardiac clot, neg bubble study, no other sig abnl  O:  Vitals:   02/21/22 0800 02/21/22 1306  BP: (!) 146/84 (!) 149/90  Pulse:  73  Resp:  19  Temp:  97.9 F (36.6 C)  SpO2: 99% 99%    Physical Exam Gen: A&Ox4, NAD HEENT: Atraumatic, normocephalic; oropharynx clear, tongue without atrophy or fasciculations. Resp: CTAB, normal work of breathing CV: RRR, extremities appear well-perfused. Abd: soft/NT/ND Extrem: Nml bulk; no cyanosis, clubbing, or edema.  Neuro: *MS: A&O x4. Follows multi-step commands.  *Speech: no dysarthria or aphasia, able to name and repeat. *CN:    I: Deferred   II,III: PERRLA, VFF by confrontation, optic discs not visualized 2/2 pupillary constriction   III,IV,VI: EOMI w/o nystagmus, no ptosis   V: Sensation intact from V1 to V3 to LT   VII: Eyelid closure was full.  Smile symmetric.   VIII: Hearing intact to voice   IX,X: Voice normal, palate elevates symmetrically    XI: SCM/trap 5/5 bilat   XII: Tongue protrudes midline, no atrophy or fasciculations  *Motor:   Normal bulk.  No tremor, rigidity or bradykinesia. No pronator drift.   Strength: Dlt Bic Tri WE WrF FgS Gr HF KnF KnE PlF DoF    Left 5 5 5 5 5 5 5 5 5 5 5 5     Right 5 5 5 5 5 5 5 5 5 5 5 5    *Sensory: Intact to light touch, pinprick, temperature vibration throughout. Symmetric. Propioception intact bilat.  No double-simultaneous extinction.  *Coordination:  Finger-to-nose, heel-to-shin, rapid alternating motions were intact. *Reflexes:  2+ and symmetric throughout without clonus; toes  down-going bilat *Gait: deferred  NIHSS = 0  A/P: This is a 76 year old woman with past medical history significant for hypertension, remote history of melanoma in the 1980s in remission to her knowledge, migraine who presents with 3 days of headache and new onset left-sided weakness and numbness.  She presented as a stroke code but was deemed too mild to treat. Her L sensory impairment and weakness have resolved but her headache persists. MRI brain with and without contrast WNL. ESR WNL at 9, GCA felt to be unlikely. Headache did not improve with BP control so not c/w hypertensive emergency. Likely combination of intractable migraine and medication overuse headache. TIA cannot be ruled out; workup for this is completed.  - CTV head r/o venous sinus thrombosis - Benadryl/compazine/toradol cocktail tonight + 500cc NS - NO IMITREX, ERGOT DERIVATIVES OR OTHER HEADACHE TX THAT CAUSES VASOCONSTRICTION - contraindicated in pts with hx stroke or TIA - Goal normotension, avoid hypotension - Continue atorvastatin 80mg  daily - ASA 81mg  daily + plavix 75mg  daily x21 days f/b ASA 81mg  daily monotherapy after that - Patient counseled on medication overuse headache, will likely be started on a daily preventive prior to discharge to facilitate OTC painkiller wean - Will need to be put on ubrelvy or other non-triptan abortive medication as outpatient (not available inpatient and requires prior auth) - Amb referral to Dr. Manuella Ghazi outpatient neurology after discharge  Will continue to follow.  Su Monks, MD [facility] [phone]  If 7pm- 7am, please page neurology on call as listed in [place].

## 2022-02-21 NOTE — Progress Notes (Signed)
       CROSS COVER NOTE  NAME: Joann Buchanan MRN: [id number] DOB : 06-08-1946    Date of Service   02/21/22  HPI/Events of Note   Patient request for Ibuprofen for 7/10 headache refractory to Tylenol, headache has been ongoing for days and home Imitrex is currently being held per Neurology recommendation.  CT Head and MRI negative for hemorrhage  Interventions   Plan: Ibuprofen x1     This document was prepared using Dragon voice recognition software and may include unintentional dictation errors.  Neomia Glass DNP, MHA, FNP-BC Nurse Practitioner [facility] Pager [phone]

## 2022-02-21 NOTE — Progress Notes (Signed)
Progress Note   Patient: Joann JAREMA JKD:[id number] DOB: [birth year] DOA: 02/20/2022     0 DOS: the patient was seen and examined on 02/21/2022   Brief hospital course: [name] is a 76 year old female with medical history of GERD, insomnia, primary hypertension, migraine headaches, melanoma, anxiety, who presents emergency department for chief concerns of left side facial and left arm tingling, with left-sided headache.  Initial vitals in the emergency department showed temperature of 98.2, respiration rate of 18, heart rate of 86, blood pressure 172/95, SPO2 of 96% on room air.  Serum sodium 138, potassium 4.3, chloride 108, bicarb 26, nonfasting blood glucose 110, BUN of 14, serum creatinine of 0.90, GFR greater than 60, WBC 5.8, hemoglobin 12.3, platelets of 385.  Ethanol level was less than 10.  CT head wo contrast was read as no acute intracranial abnormality.  CTA head and neck with and without contrast material was read as: Normal CT angiography of the head and neck vessels.  No demonstrable atherosclerotic change or other vascular pathology.  No stenosis or occlusion.  ED treatment: Labetalol 10 mg IV one-time dose.  MRI of the brain with and without contrast negative for acute event.  CT angio negative for large vessel occlusion.  Echocardiogram shows normal EF.  Patient given Depakote and Decadron on the morning of 02/21/2022 for headache.  In the afternoon, ordered Compazine Benadryl and Toradol.  Neurology recommended watching overnight.  Assessment and Plan: * TIA (transient ischemic attack) TIA versus complex migraine with neurological symptoms.  MRI of the brain negative.  Patient given Depakote and Decadron this morning.  This afternoon given Compazine Benadryl and Toradol.  Will need neurology follow-up as outpatient.  Currently on aspirin Plavix and Lipitor.  On medications for headache.  Sedimentation rate 9 which rules out temporal arteritis.  Insomnia Continue  home trazodone 50-100 mg nightly as needed for sleep  Hyperlipidemia, mild LDL 98 on Lipitor.  GERD - PPI  History of melanoma - MRI brain negative.  No metastases seen.        Subjective: Patient seen this morning and had a headache.  She was ordered Decadron and Depakote and injections.  Still having some headache in the afternoon and neurology ordered Compazine Benadryl and Toradol.  Physical Exam: Vitals:   02/20/22 2356 02/21/22 0439 02/21/22 0800 02/21/22 1306  BP: (!) 148/74 (!) 158/89 (!) 146/84 (!) 149/90  Pulse: 64 66  73  Resp: '17 18  19  '$ Temp: 97.6 F (36.4 C) 97.8 F (36.6 C)  97.9 F (36.6 C)  TempSrc:   Oral   SpO2: 98% 97% 99% 99%  Weight:      Height:       Physical Exam HENT:     Head: Normocephalic.     Mouth/Throat:     Pharynx: No oropharyngeal exudate.  Eyes:     General: Lids are normal.     Conjunctiva/sclera: Conjunctivae normal.  Cardiovascular:     Rate and Rhythm: Normal rate and regular rhythm.     Heart sounds: Normal heart sounds, S1 normal and S2 normal.  Pulmonary:     Breath sounds: No decreased breath sounds, wheezing, rhonchi or rales.  Abdominal:     Palpations: Abdomen is soft.     Tenderness: There is no abdominal tenderness.  Musculoskeletal:     Right lower leg: No swelling.     Left lower leg: No swelling.  Skin:    General: Skin is warm.  Findings: No rash.  Neurological:     Mental Status: She is alert and oriented to person, place, and time.     Comments: Power 5 out of 5 upper and lower extremities.  Sensation intact bilateral face.     Data Reviewed: MRI of the brain negative with and without contrast, CT angiogram head and neck negative.  Echocardiogram normal EF.  CBC normal range creatinine 0.9, LDL 98  Family Communication: Spoke with family at the bedside  Disposition: Status is: Observation Neurology giving another round of medications for headache this afternoon and recommended watching till  tomorrow.  Planned Discharge Destination: Home    Time spent: 28 minutes  Author: Loletha Grayer, MD 02/21/2022 4:05 PM  For on call review [email or web address].

## 2022-02-21 NOTE — Evaluation (Addendum)
Occupational Therapy Evaluation Patient Details Name: Joann Buchanan MRN: [id number] DOB: [birth year] Today's Date: 02/21/2022   History of Present Illness Ms. Panzy Bubeck is a 76 year old female with medical history of GERD, insomnia, primary hypertension, migraine headaches, melanoma, anxiety, who presents emergency department for chief concerns of left side facial and left arm tingling, with left-sided headache. CT Head and MRI negative for hemorrhage.   Clinical Impression   Patient seen for OT evaluation this date, daughter present in room. Patient presenting with headache, dizziness, impaired sensation, and impaired balance. At baseline, pt is independent in ADLs, IADLs, and functional mobility without an AD. Pt lives alone, but has a daughter and son nearby who can provide assistance as needed at discharge. Pt currently functioning at Mod I for ADLs and supervision for functional mobility/transfers without an AD. Formal testing for AROM, strength, and coordination completed and WNL for BUEs. Pt reports being at baseline level of function in ADLs, therefore, no acute skilled OT services are indicated while pt is in this hospital or upon discharge.     Recommendations for follow up therapy are one component of a multi-disciplinary discharge planning process, led by the attending physician.  Recommendations may be updated based on patient status, additional functional criteria and insurance authorization.   Follow Up Recommendations  No OT follow up    Assistance Recommended at Discharge PRN  Patient can return home with the following      Functional Status Assessment  Patient has not had a recent decline in their functional status  Equipment Recommendations  None recommended by OT    Recommendations for Other Services       Precautions / Restrictions Precautions Precautions: Fall Restrictions Weight Bearing Restrictions: No      Mobility Bed Mobility Overal bed mobility:  Independent                  Transfers Overall transfer level: Needs assistance Equipment used: None Transfers: Sit to/from Stand Sit to Stand: Supervision           General transfer comment: STS from EOB with supervision and no AD      Balance Overall balance assessment: Needs assistance Sitting-balance support: Feet supported Sitting balance-Leahy Scale: Normal Sitting balance - Comments: dynamic sitting balance at EOB   Standing balance support: No upper extremity supported, During functional activity Standing balance-Leahy Scale: Good Standing balance comment: Good dynamic standing balance at sink with no AD (did not require UE support).                           ADL either performed or assessed with clinical judgement   ADL Overall ADL's : Modified independent                                       General ADL Comments: Pt completed grooming tasks (face washing, hand hygiene) while standing at the sink with no AD and supervision for safety. Anticipate pt could gather self-care items and then complete an entire ADL session with Mod I for increased time.     Vision Patient Visual Report: No change from baseline Additional Comments: wears reading glasses, pt reports having hazy spots sometimes, previous cataract surgery Visual pursuits WNL, pt able to track stimulus     Perception     Praxis      Pertinent Vitals/Pain Pain Assessment  Pain Assessment: No/denies pain     Hand Dominance     Extremity/Trunk Assessment Upper Extremity Assessment Upper Extremity Assessment: Overall WFL for tasks assessed (BUE: AROM WFL, grip strength good, thumb opposition intact, finger to nose intact)   Lower Extremity Assessment Lower Extremity Assessment: Overall WFL for tasks assessed   Cervical / Trunk Assessment Cervical / Trunk Assessment: Normal   Communication Communication Communication: No difficulties   Cognition  Arousal/Alertness: Awake/alert Behavior During Therapy: WFL for tasks assessed/performed Overall Cognitive Status: Within Functional Limits for tasks assessed                                       General Comments  Pt reports some dizzines that was present before admission to hospital, non-limiting during session. Pt reports having numbness/tingling in LUE and LLE at admission, improving. Light touch intact for BUEs.    Exercises     Shoulder Instructions      Home Living Family/patient expects to be discharged to:: Private residence Living Arrangements: Alone Available Help at Discharge: Family;Available PRN/intermittently Type of Home: House Home Access: Stairs to enter [employer] of Steps: 2 Entrance Stairs-Rails: None Home Layout: One level     Bathroom Shower/Tub: Occupational psychologist: Standard     Home Equipment: Shower seat - built Agricultural consultant (2 wheels);Cane - single point;Rollator (4 wheels)   Additional Comments: Daughter lives nearby and son is across the street.      Prior Functioning/Environment Prior Level of Function : Independent/Modified Independent;Driving             Mobility Comments: IND with no AD, denies a history of falls in the past 6 months ADLs Comments: IND with ADLs/IADLs. Still drives, manages medication, retired.        OT Problem List: Impaired balance (sitting and/or standing);Impaired sensation      OT Treatment/Interventions:      OT Goals(Current goals can be found in the care plan section) Acute Rehab OT Goals Patient Stated Goal: return home OT Goal Formulation: All assessment and education complete, DC therapy Potential to Achieve Goals: Good  OT Frequency:      Co-evaluation              AM-PAC OT "6 Clicks" Daily Activity     Outcome Measure Help from another person eating meals?: None Help from another person taking care of personal grooming?:  None Help from another person toileting, which includes using toliet, bedpan, or urinal?: None Help from another person bathing (including washing, rinsing, drying)?: None Help from another person to put on and taking off regular upper body clothing?: None Help from another person to put on and taking off regular lower body clothing?: None 6 Click Score: 24   End of Session Equipment Utilized During Treatment: Gait belt Nurse Communication: Mobility status  Activity Tolerance: Patient tolerated treatment well Patient left: in bed;with call bell/phone within reach;with bed alarm set;with family/visitor present  OT Visit Diagnosis: Dizziness and giddiness (R42);Unsteadiness on feet (R26.81)                Time: 1040-1100 OT Time Calculation (min): 20 min Charges:  OT General Charges $OT Visit: 1 Visit OT Evaluation $OT Eval Low Complexity: 1 Low  Alt[employer] LP MS, OTR/L ascom [phone]  02/21/22, 12:32 PM

## 2022-02-21 NOTE — Progress Notes (Signed)
Nutrition Brief Note  Patient identified on the Malnutrition Screening Tool (MST) Report  Wt Readings from Last 15 Encounters:  02/20/22 55.3 kg  08/30/21 54.4 kg  08/25/21 53.5 kg  08/25/21 53.6 kg  03/31/19 62.6 kg  10/16/17 61.9 kg  10/10/17 62.9 kg  08/14/17 60.9 kg  06/27/17 62.7 kg  10/30/16 63.5 kg  10/09/16 63.7 kg  07/13/16 63.3 kg  06/28/16 64.2 kg  06/19/16 63.7 kg  06/05/16 62.6 kg   Pt with medical history of GERD, insomnia, primary hypertension, migraine headaches, melanoma, anxiety, who presents emergency department for chief concerns of left side facial and left arm tingling, with left-sided headache.  Pt admitted with TIA.   Spoke with pt at bedside, who was pleasant and in good spirits today. She denies any issues chewing or swallowing. Per pt, she has a fair appetite at home and consumes 3 meals per day, however, meals are small. She has been eating well at the hospital and enjoyed her pancakes at breakfast.  Per pt, her UBW is around 140#. SHe admits to a gradual weight loss over the past 1.5 years (about 20#), which she lost gradually secondary to her husband's passing. Reviewed wt hx; wt has been stable over the past 6 months.   Nutrition-Focused physical exam completed. Findings are no fat depletion, no muscle depletion, and no edema.    Medications reviewed.   Labs reviewed: CBGS: 101 (inpatient orders for glycemic control are none).    Current diet order is Heart Healthy (liberalize diet to regular), patient is consuming approximately 75-100% of meals at this time. Labs and medications reviewed.   No nutrition interventions warranted at this time. If nutrition issues arise, please consult RD.   Loistine Chance, RD, LDN, [facility] Registered Dietitian II Certified Diabetes Care and Education Specialist Please refer to [facility] for RD and/or RD on-call/weekend/after hours pager

## 2022-02-21 NOTE — Evaluation (Signed)
Physical Therapy Evaluation Patient Details Name: Joann Buchanan MRN: [id number] DOB: 01-04-1946 Today's Date: 02/21/2022  History of Present Illness  [name] is a 76 year old female with medical history of GERD, insomnia, primary hypertension, migraine headaches, melanoma, anxiety, who presents emergency department for chief concerns of left side facial and left arm tingling, with left-sided headache. CT Head and MRI negative for hemorrhage.  Clinical Impression  Patient resting in bed upon arrival to room; daughter at bedside.  Patient alert and oriented, follows commands and agreeable to participation with session.  Endorses persistent headache (meds administered per RN during session); no additional focal pain reported.  Bilat UE/LE strength and ROM grossly symmetrical and WFL; no focal weakness, sensory or coordination deficits.  Feels initial paresthesias are fully resolved (with some minor residual at corner of L mouth).  Able to complete bed mobility with mod indep; sit/stand, basic transfers and gait (200') without assist device, mod indep.  BERG score 52/56; patient aware of higher-level deficits and with good use of safety/compesnatory strategies as needed.  Gait pattern demonstrates reciprocal stepping pattern with good step height/length, fair cadence; completes head turns, changes of direction without LOB or safety concern.  Feels gait performance is near baseline for her. No acute PT needs identified at this time, as patient is mod indep and at/near baseline.  Do recommend continued mobilization throughout remaining hospital stay; will enroll with mobility specialist team to ensure progressive mobiltiy efforts.  Will complete initial order at this time; please re-consult should needs change.     Recommendations for follow up therapy are one component of a multi-disciplinary discharge planning process, led by the attending physician.  Recommendations may be updated based on patient  status, additional functional criteria and insurance authorization.  Follow Up Recommendations No PT follow up      Assistance Recommended at Discharge    Patient can return home with the following       Equipment Recommendations  (no equipment needed)  Recommendations for Other Services       Functional Status Assessment Patient has not had a recent decline in their functional status     Precautions / Restrictions Precautions Precautions: None Restrictions Weight Bearing Restrictions: No      Mobility  Bed Mobility Overal bed mobility: Independent                  Transfers Overall transfer level: Modified independent Equipment used: None Transfers: Sit to/from Stand Sit to Stand: Modified independent (Device/Increase time)                Ambulation/Gait Ambulation/Gait assistance: Modified independent (Device/Increase time) Gait Distance (Feet): 200 Feet Assistive device: None         General Gait Details: reciprocal stepping pattern with good step height/length, fair cadence; completes head turns, changes of direction without LOB or safety concern.  Feels gait performance is near baseline for her.l  Stairs            Wheelchair Mobility    Modified Rankin (Stroke Patients Only)       Balance Overall balance assessment: Modified Independent   Sitting balance-Leahy Scale: Normal       Standing balance-Leahy Scale: Good                   Standardized Balance Assessment Standardized Balance Assessment : Berg Balance Test Berg Balance Test Sit to Stand: Able to stand without using hands and stabilize independently Standing Unsupported: Able to stand safely  2 minutes Sitting with Back Unsupported but Feet Supported on Floor or Stool: Able to sit safely and securely 2 minutes Stand to Sit: Sits safely with minimal use of hands Transfers: Able to transfer safely, minor use of hands Standing Unsupported with Eyes Closed:  Able to stand 10 seconds safely Standing Ubsupported with Feet Together: Able to place feet together independently and stand 1 minute safely From Standing, Reach Forward with Outstretched Arm: Can reach confidently >25 cm (10") From Standing Position, Pick up Object from Floor: Able to pick up shoe safely and easily From Standing Position, Turn to Look Behind Over each Shoulder: Looks behind from both sides and weight shifts well Turn 360 Degrees: Able to turn 360 degrees safely in 4 seconds or less Standing Unsupported, Alternately Place Feet on Step/Stool: Able to complete 4 steps without aid or supervision Standing Unsupported, One Foot in Front: Able to plae foot ahead of the other independently and hold 30 seconds Standing on One Leg: Able to lift leg independently and hold 5-10 seconds Total Score: 52         Pertinent Vitals/Pain Pain Assessment Pain Assessment: Faces Faces Pain Scale: Hurts little more Pain Location: headache Pain Descriptors / Indicators: Aching, Grimacing Pain Intervention(s): Limited activity within patient's tolerance, Monitored during session, RN gave pain meds during session, Repositioned    Home Living Family/patient expects to be discharged to:: Private residence Living Arrangements: Alone Available Help at Discharge: Family;Available PRN/intermittently Type of Home: House Home Access: Stairs to enter Entrance Stairs-Rails: None Entrance Stairs-Number of Steps: 2   Home Layout: One level Home Equipment: Shower seat - built Agricultural consultant (2 wheels);Cane - single point;Rollator (4 wheels) Additional Comments: Daughter lives nearby and son is across the street.    Prior Function Prior Level of Function : Independent/Modified Independent;Driving             Mobility Comments: IND with no AD, denies a history of falls in the past 6 months ADLs Comments: IND with ADLs/IADLs. Still drives, manages medication, retired.     Hand  Dominance   Dominant Hand: Right    Extremity/Trunk Assessment   Upper Extremity Assessment Upper Extremity Assessment: Overall WFL for tasks assessed    Lower Extremity Assessment Lower Extremity Assessment: Overall WFL for tasks assessed (grossly 5/5 throughout; no focal weakness, sensory or coordination deficit)    Cervical / Trunk Assessment Cervical / Trunk Assessment: Normal  Communication   Communication: No difficulties  Cognition Arousal/Alertness: Awake/alert Behavior During Therapy: WFL for tasks assessed/performed Overall Cognitive Status: Within Functional Limits for tasks assessed                                          General Comments General comments (skin integrity, edema, etc.): Pt reports some dizzines that was present before admission to hospital, non-limiting during session. Pt reports having numbness/tingling in LUE and LLE at admission, improving.    Exercises     Assessment/Plan    PT Assessment Patient does not need any further PT services;Patient needs continued PT services  PT Problem List Decreased balance;Decreased activity tolerance;Decreased mobility       PT Treatment Interventions Gait training;Functional mobility training;Therapeutic activities;Therapeutic exercise;Cognitive remediation;Patient/family education    PT Goals (Current goals can be found in the Care Plan section)  Acute Rehab PT Goals Patient Stated Goal: to make this headache go away, and to get  back home! PT Goal Formulation: All assessment and education complete, DC therapy Time For Goal Achievement: 02/21/22 Potential to Achieve Goals: Good    Frequency       Co-evaluation               AM-PAC PT "6 Clicks" Mobility  Outcome Measure Help needed turning from your back to your side while in a flat bed without using bedrails?: None Help needed moving from lying on your back to sitting on the side of a flat bed without using bedrails?:  None Help needed moving to and from a bed to a chair (including a wheelchair)?: None Help needed standing up from a chair using your arms (e.g., wheelchair or bedside chair)?: None Help needed to walk in hospital room?: None Help needed climbing 3-5 steps with a railing? : None 6 Click Score: 24    End of Session Equipment Utilized During Treatment: Gait belt Activity Tolerance: Patient tolerated treatment well Patient left: in bed;with call bell/phone within reach;with family/visitor present Nurse Communication: Mobility status PT Visit Diagnosis: Muscle weakness (generalized) (M62.81);Difficulty in walking, not elsewhere classified (R26.2)    Time: 6503-5465 PT Time Calculation (min) (ACUTE ONLY): 24 min   Charges:   PT Evaluation $PT Eval Moderate Complexity: 1 Mod         Bell Cai H. Owens Shark, PT, DPT, NCS 02/21/22, 3:08 PM [phone]

## 2022-02-22 ENCOUNTER — Telehealth: Payer: Self-pay | Admitting: Family Medicine

## 2022-02-22 DIAGNOSIS — G43709 Chronic migraine without aura, not intractable, without status migrainosus: Secondary | ICD-10-CM

## 2022-02-22 DIAGNOSIS — G459 Transient cerebral ischemic attack, unspecified: Secondary | ICD-10-CM

## 2022-02-22 DIAGNOSIS — G4700 Insomnia, unspecified: Secondary | ICD-10-CM | POA: Diagnosis not present

## 2022-02-22 DIAGNOSIS — E785 Hyperlipidemia, unspecified: Secondary | ICD-10-CM | POA: Diagnosis not present

## 2022-02-22 MED ORDER — ATORVASTATIN CALCIUM 80 MG PO TABS: 80.0000 mg | ORAL_TABLET | Freq: Every day | ORAL | 0 refills | Status: DC

## 2022-02-22 MED ORDER — ASPIRIN 81 MG PO CHEW: 81.0000 mg | CHEWABLE_TABLET | Freq: Every day | ORAL | 0 refills | Status: DC

## 2022-02-22 MED ORDER — PANTOPRAZOLE SODIUM 40 MG PO TBEC
40.0000 mg | DELAYED_RELEASE_TABLET | Freq: Every day | ORAL | 0 refills | Status: DC
Start: 2022-02-22 — End: 2022-08-22

## 2022-02-22 MED ORDER — CLOPIDOGREL BISULFATE 75 MG PO TABS: 75.0000 mg | ORAL_TABLET | Freq: Every day | ORAL | 0 refills | Status: DC

## 2022-02-22 MED ORDER — TOPIRAMATE 25 MG PO TABS: 25.0000 mg | ORAL_TABLET | Freq: Every day | ORAL | 0 refills | Status: DC

## 2022-02-22 NOTE — Progress Notes (Signed)
  Transition of Care [facility]) Screening Note   Patient Details  Name: Joann Buchanan Date of Birth: [birth year]   Transition of Care [facility]) CM/SW Contact:    Alberteen Sam, LCSW Phone Number: 02/22/2022, 9:46 AM    Transition of Care Department [facility]) has reviewed patient and no TOC needs have been identified at this time. We will continue to monitor patient advancement through interdisciplinary progression rounds. If new patient transition needs arise, please place a TOC consult.  [place], [place]

## 2022-02-22 NOTE — Assessment & Plan Note (Signed)
During the hospital course the patient was given Decadron and Depakote.  Also given Compazine Toradol and Benadryl.  Patient was feeling better upon discharge.  Case discussed with neurology and they wanted to start Topamax 25 mg nightly to prevent migraine.  Recommend limiting as needed medications to twice a week.  Triptans are contraindicated with somebody that has a TIA or stroke.  Recommend following up with neurology as outpatient for further treatment of migraines.

## 2022-02-22 NOTE — Telephone Encounter (Signed)
Patient called in asking could a referral be sent over to [facility] clinic to Dr Vladimir Crofts. Would she have to come in to be seen in office in order to get this referral sent out?

## 2022-02-22 NOTE — Discharge Instructions (Addendum)
Use protonix while taking the plavix, can go back to omeprazole after plavix stopped. Can use as needed medications twice a week for headache. Stop sumatriptan (imitrex)  Topamax started to prevent headache

## 2022-02-22 NOTE — Discharge Summary (Signed)
Physician Discharge Summary   Patient: Joann Buchanan MRN: [id number] DOB: 1945/09/13  Admit date:     02/20/2022  Discharge date: 02/22/22  Discharge Physician: Loletha Grayer   PCP: Abner Greenspan, MD   Recommendations at discharge:   Follow-up PCP 5 days Referral to neurology  Discharge Diagnoses: Principal Problem:   TIA (transient ischemic attack) Active Problems:   Migraine   History of melanoma   Essential hypertension   GERD   Hyperlipidemia, mild   Insomnia   Hospital Course: Joann Buchanan is a 76 year old female with medical history of GERD, insomnia, primary hypertension, migraine headaches, melanoma, anxiety, who presents emergency department for chief concerns of left side facial and left arm tingling, with left-sided headache.  Initial vitals in the emergency department showed temperature of 98.2, respiration rate of 18, heart rate of 86, blood pressure 172/95, SPO2 of 96% on room air.  Serum sodium 138, potassium 4.3, chloride 108, bicarb 26, nonfasting blood glucose 110, BUN of 14, serum creatinine of 0.90, GFR greater than 60, WBC 5.8, hemoglobin 12.3, platelets of 385.  Ethanol level was less than 10.  CT head wo contrast was read as no acute intracranial abnormality.  CTA head and neck with and without contrast material was read as: Normal CT angiography of the head and neck vessels.  No demonstrable atherosclerotic change or other vascular pathology.  No stenosis or occlusion.  ED treatment: Labetalol 10 mg IV one-time dose.  MRI of the brain with and without contrast negative for acute event.  CT angio negative for large vessel occlusion.  Echocardiogram shows normal EF.  Patient given Depakote and Decadron on the morning of 02/21/2022 for headache.  In the afternoon, ordered Compazine Benadryl and Toradol.  Neurology recommended watching overnight.  CT venogram also negative.  Sedimentation rate low at 9.  Patient feeling better on the day of  discharge.  Neurology recommended follow-up as outpatient.  Triptans are contraindicated.  Assessment and Plan: * TIA (transient ischemic attack) Neurology recommends treating for TIA with aspirin lifelong, Lipitor and Plavix for total of 21 days  Migraine During the hospital course the patient was given Decadron and Depakote.  Also given Compazine Toradol and Benadryl.  Patient was feeling better upon discharge.  Case discussed with neurology and they wanted to start Topamax 25 mg nightly to prevent migraine.  Recommend limiting as needed medications to twice a week.  Triptans are contraindicated with somebody that has a TIA or stroke.  Recommend following up with neurology as outpatient for further treatment of migraines.  Insomnia Continue home trazodone 50-100 mg nightly as needed for sleep  Hyperlipidemia, mild LDL 98 on Lipitor.  GERD - PPI  History of melanoma - MRI brain negative.  No metastases seen.         Consultants: Neurology Procedures performed: None Disposition: Home Diet recommendation:  Regular diet DISCHARGE MEDICATION: Allergies as of 02/22/2022       Reactions   Sulfonamide Derivatives Anaphylaxis, Other (See Comments)   REACTION: unspecified Hives and swelling        Medication List     STOP taking these medications    famotidine 20 MG tablet Commonly known as: PEPCID   fluticasone 50 MCG/ACT nasal spray Commonly known as: FLONASE   ibuprofen 200 MG tablet Commonly known as: ADVIL   methocarbamol 500 MG tablet Commonly known as: ROBAXIN   omeprazole 40 MG capsule Commonly known as: PRILOSEC   SUMAtriptan 20 MG/ACT nasal spray Commonly known  as: IMITREX       TAKE these medications    ALPRAZolam 0.25 MG tablet Commonly known as: XANAX Take 1 tablet (0.25 mg total) by mouth as needed. Prior to flights   aspirin 81 MG chewable tablet Chew 1 tablet (81 mg total) by mouth daily. Start taking on: February 23, 85    Aspirin-Salicylamide-Caffeine 761-95-09 MG Tabs Take 1 packet by mouth daily as needed (for headache).   atorvastatin 80 MG tablet Commonly known as: LIPITOR Take 1 tablet (80 mg total) by mouth daily. Start taking on: February 23, 2022   clopidogrel 75 MG tablet Commonly known as: PLAVIX Take 1 tablet (75 mg total) by mouth daily. Start taking on: February 23, 2022   pantoprazole 40 MG tablet Commonly known as: Protonix Take 1 tablet (40 mg total) by mouth daily.   Polyethyl Glycol-Propyl Glycol 0.4-0.3 % Soln Place 1 drop into both eyes as needed.   topiramate 25 MG tablet Commonly known as: Topamax Take 1 tablet (25 mg total) by mouth at bedtime.   traZODone 50 MG tablet Commonly known as: DESYREL Take 1-2 tablets (50-100 mg total) by mouth at bedtime as needed. for sleep        Follow-up Information     Tower, Wynelle Fanny, MD. Go on 03/05/2022.   Specialties: Family Medicine, Radiology Why: @ 2pm Contact information: [place] [address] [phone]         Vladimir Crofts, MD Follow up.   Specialty: Neurology Why: No answer patient /family to make follow up appt Contact information: [facility] [place] [address] [phone]                Discharge Exam: Filed Weights   02/20/22 1152  Weight: 55.3 kg   Physical Exam HENT:     Head: Normocephalic.     Mouth/Throat:     Pharynx: No oropharyngeal exudate.  Eyes:     General: Lids are normal.     Conjunctiva/sclera: Conjunctivae normal.  Cardiovascular:     Rate and Rhythm: Normal rate and regular rhythm.     Heart sounds: Normal heart sounds, S1 normal and S2 normal.  Pulmonary:     Breath sounds: No decreased breath sounds, wheezing, rhonchi or rales.  Abdominal:     Palpations: Abdomen is soft.     Tenderness: There is no abdominal tenderness.  Musculoskeletal:     Right lower leg: No swelling.     Left lower leg: No  swelling.  Skin:    General: Skin is warm.     Findings: No rash.  Neurological:     Mental Status: She is alert and oriented to person, place, and time.     Comments: Power 5 out of 5 upper and lower extremities.  Sensation intact bilateral face.      Condition at discharge: stable  The results of significant diagnostics from this hospitalization (including imaging, microbiology, ancillary and laboratory) are listed below for reference.   Imaging Studies: CT VENOGRAM HEAD  Result Date: 02/21/2022 CLINICAL DATA:  Headache EXAM: CT VENOGRAM HEAD TECHNIQUE: Venographic phase images of the brain were obtained following the administration of intravenous contrast. Multiplanar reformats and maximum intensity projections were generated. RADIATION DOSE REDUCTION: This exam was performed according to the departmental dose-optimization program which includes automated exposure control, adjustment of the mA and/or kV according to patient size and/or use of iterative reconstruction technique. CONTRAST:  26m OMNIPAQUE IOHEXOL  350 MG/ML SOLN COMPARISON:  None Available. FINDINGS: Superior sagittal sinus: Normal. Straight sinus: Normal. Inferior sagittal sinus, vein of Galen and internal cerebral veins: Normal. Transverse sinuses: Normal. Sigmoid sinuses: Normal. Visualized jugular veins: Normal. IMPRESSION: Normal CT venogram. Electronically Signed   By: Ulyses Jarred M.D.   On: 02/21/2022 21:51   MR BRAIN W WO CONTRAST  Result Date: 02/20/2022 CLINICAL DATA:  Left-sided weakness and numbness; history of melanoma EXAM: MRI HEAD WITHOUT AND WITH CONTRAST TECHNIQUE: Multiplanar, multiecho pulse sequences of the brain and surrounding structures were obtained without and with intravenous contrast. CONTRAST:  45m GADAVIST GADOBUTROL 1 MMOL/ML IV SOLN COMPARISON:  No prior MRI, correlation is made with CT head 02/20/2022 FINDINGS: Brain: No restricted diffusion to suggest acute or subacute infarct. No acute  hemorrhage, mass, mass effect, or midline shift. No abnormal parenchymal or meningeal enhancement. No hemosiderin deposition to suggest remote hemorrhage. Cerebral volume is normal for age. Normal pituitary and craniocervical junction. Vascular: Normal arterial flow voids. Normal arterial and venous enhancement. Skull and upper cervical spine: Normal marrow signal. Partially visualized ACDF in the cervical spine. Sinuses/Orbits: No acute finding. Status post bilateral lens replacements. Other: Fluid in the bilateral mastoid air cells. IMPRESSION: No acute intracranial process. No evidence of acute or subacute infarct. Electronically Signed   By: AMerilyn BabaM.D.   On: 02/20/2022 22:21   ECHOCARDIOGRAM COMPLETE BUBBLE STUDY  Result Date: 02/20/2022    ECHOCARDIOGRAM REPORT   Patient Name:   DDAISHA FILOSADate of Exam: 02/20/2022 Medical Rec #:  [id number]    Height:       60.0 in Accession #:    [id number]   Weight:       122.0 lb Date of Birth:  [birth year]    BSA:          1.513 m Patient Age:    89years      BP:           170/89 mmHg Patient Gender: F             HR:           73 bpm. Exam Location:  [facility] Procedure: 2D Echo, Cardiac Doppler, Color Doppler and Saline Contrast Bubble            Study Indications:     Stroke 434.91 /I63.9  History:         Patient has prior history of Echocardiogram examinations, most                  recent 08/13/2017. Risk Factors:Hypertension. Migraine.  Sonographer:     JSherrie SportReferring Phys:  AIvaleeDiagnosing Phys: TIda RogueMD  Sonographer Comments: Suboptimal apical window. IMPRESSIONS  1. Left ventricular ejection fraction, by estimation, is 55 to 60%. The left ventricle has normal function. The left ventricle has no regional wall motion abnormalities. Left ventricular diastolic parameters are consistent with Grade I diastolic dysfunction (impaired relaxation).  2. Right ventricular systolic function is normal. The right ventricular size is  normal.  3. The mitral valve is normal in structure. Trivial mitral valve regurgitation. No evidence of mitral stenosis.  4. The aortic valve was not well visualized. Aortic valve regurgitation is not visualized. No aortic stenosis is present.  5. The inferior vena cava is normal in size with greater than 50% respiratory variability, suggesting right atrial pressure of 3 mmHg.  6. Agitated saline contrast bubble study was negative, with  no evidence of any interatrial shunt. FINDINGS  Left Ventricle: Left ventricular ejection fraction, by estimation, is 55 to 60%. The left ventricle has normal function. The left ventricle has no regional wall motion abnormalities. The left ventricular internal cavity size was normal in size. There is  no left ventricular hypertrophy. Left ventricular diastolic parameters are consistent with Grade I diastolic dysfunction (impaired relaxation). Right Ventricle: The right ventricular size is normal. No increase in right ventricular wall thickness. Right ventricular systolic function is normal. Left Atrium: Left atrial size was normal in size. Right Atrium: Right atrial size was normal in size. Pericardium: There is no evidence of pericardial effusion. Mitral Valve: The mitral valve is normal in structure. Trivial mitral valve regurgitation. No evidence of mitral valve stenosis. Tricuspid Valve: The tricuspid valve is normal in structure. Tricuspid valve regurgitation is not demonstrated. No evidence of tricuspid stenosis. Aortic Valve: The aortic valve was not well visualized. Aortic valve regurgitation is not visualized. No aortic stenosis is present. Aortic valve mean gradient measures 2.0 mmHg. Aortic valve peak gradient measures 3.0 mmHg. Aortic valve area, by VTI measures 2.26 cm. Pulmonic Valve: The pulmonic valve was normal in structure. Pulmonic valve regurgitation is not visualized. No evidence of pulmonic stenosis. Aorta: The aortic root is normal in size and structure.  Venous: The inferior vena cava is normal in size with greater than 50% respiratory variability, suggesting right atrial pressure of 3 mmHg. IAS/Shunts: No atrial level shunt detected by color flow Doppler. Agitated saline contrast was given intravenously to evaluate for intracardiac shunting. Agitated saline contrast bubble study was negative, with no evidence of any interatrial shunt.  LEFT VENTRICLE PLAX 2D LVIDd:         3.20 cm   Diastology LVIDs:         2.20 cm   LV e' medial:    6.20 cm/s LV PW:         0.90 cm   LV E/e' medial:  9.2 LV IVS:        0.70 cm   LV e' lateral:   6.09 cm/s LVOT diam:     2.00 cm   LV E/e' lateral: 9.4 LV SV:         41 LV SV Index:   27 LVOT Area:     3.14 cm  RIGHT VENTRICLE RV S prime:     12.80 cm/s TAPSE (M-mode): 2.7 cm LEFT ATRIUM           Index        RIGHT ATRIUM          Index LA diam:      2.60 cm 1.72 cm/m   RA Area:     8.68 cm LA Vol (A2C): 37.4 ml 24.72 ml/m  RA Volume:   14.80 ml 9.78 ml/m LA Vol (A4C): 19.8 ml 13.09 ml/m  AORTIC VALVE AV Area (Vmax):    2.48 cm AV Area (Vmean):   2.31 cm AV Area (VTI):     2.26 cm AV Vmax:           86.10 cm/s AV Vmean:          63.700 cm/s AV VTI:            0.181 m AV Peak Grad:      3.0 mmHg AV Mean Grad:      2.0 mmHg LVOT Vmax:         67.90 cm/s LVOT Vmean:  46.900 cm/s LVOT VTI:          0.130 m LVOT/AV VTI ratio: 0.72  AORTA Ao Root diam: 3.35 cm MITRAL VALVE               TRICUSPID VALVE MV Area (PHT): 3.36 cm    TR Peak grad:   19.5 mmHg MV Decel Time: 226 msec    TR Vmax:        221.00 cm/s MV E velocity: 57.00 cm/s MV A velocity: 84.00 cm/s  SHUNTS MV E/A ratio:  0.68        Systemic VTI:  0.13 m                            Systemic Diam: 2.00 cm Ida Rogue MD Electronically signed by Ida Rogue MD Signature Date/Time: 02/20/2022/5:04:23 PM    Final    CT ANGIO HEAD NECK W WO CM (CODE STROKE)  Result Date: 02/20/2022 CLINICAL DATA:  Neuro deficit, acute, stroke suspected. Left-sided weakness  and numbness. Question left visual neglect. Symptoms began this morning. EXAM: CT ANGIOGRAPHY HEAD AND NECK TECHNIQUE: Multidetector CT imaging of the head and neck was performed using the standard protocol during bolus administration of intravenous contrast. Multiplanar CT image reconstructions and MIPs were obtained to evaluate the vascular anatomy. Carotid stenosis measurements (when applicable) are obtained utilizing NASCET criteria, using the distal internal carotid diameter as the denominator. RADIATION DOSE REDUCTION: This exam was performed according to the departmental dose-optimization program which includes automated exposure control, adjustment of the mA and/or kV according to patient size and/or use of iterative reconstruction technique. CONTRAST:  32m OMNIPAQUE IOHEXOL 350 MG/ML SOLN COMPARISON:  Head CT same day FINDINGS: CTA NECK FINDINGS Aortic arch: No aortic atherosclerotic calcification. No aneurysm or dissection. Branching pattern is normal without origin stenosis. Right carotid system: Common carotid artery widely patent to the bifurcation. Normal carotid bifurcation without soft or calcified plaque. Cervical ICA widely patent. Left carotid system: Left carotid system similarly normal. Vertebral arteries: Both vertebral artery origins are widely patent. Both vertebral arteries are normal through the cervical region to the foramen magnum. Skeleton: Distant ACDF C4-5.  Chronic cervical facet osteoarthritis. Other neck: No mass or lymphadenopathy. Upper chest: Normal Review of the MIP images confirms the above findings CTA HEAD FINDINGS Anterior circulation: Both internal carotid arteries are widely patent through the skull base and siphon regions. No siphon stenosis. The anterior and middle cerebral vessels are patent. No large vessel occlusion or proximal stenosis. No aneurysm or vascular malformation. Posterior circulation: Both vertebral arteries are widely patent to the basilar. No basilar  stenosis. Posterior circulation branch vessels are normal. Venous sinuses: Patent and normal. Anatomic variants: None significant. Review of the MIP images confirms the above findings IMPRESSION: Normal CT angiography of the head and neck vessels. No demonstrable atherosclerotic change or other vascular pathology. No stenosis or occlusion. Electronically Signed   By: MNelson ChimesM.D.   On: 02/20/2022 12:45   CT HEAD CODE STROKE WO CONTRAST  Result Date: 02/20/2022 CLINICAL DATA:  Code stroke.  Acute neuro deficit.  Rule out stroke EXAM: CT HEAD WITHOUT CONTRAST TECHNIQUE: Contiguous axial images were obtained from the base of the skull through the vertex without intravenous contrast. RADIATION DOSE REDUCTION: This exam was performed according to the departmental dose-optimization program which includes automated exposure control, adjustment of the mA and/or kV according to patient size and/or use of iterative reconstruction technique. COMPARISON:  None Available. FINDINGS: Brain: No evidence of acute infarction, hemorrhage, hydrocephalus, extra-axial collection or mass lesion/mass effect. Vascular: Negative for hyperdense vessel Skull: Negative Sinuses/Orbits: Paranasal sinuses clear. Bilateral cataract extraction. Mild mastoid effusion bilaterally Other: None ASPECTS ([place] Stroke Program Early CT Score) - Ganglionic level infarction (caudate, lentiform nuclei, internal capsule, insula, M1-M3 cortex): 7 - Supraganglionic infarction (M4-M6 cortex): 3 Total score (0-10 with 10 being normal): 10 IMPRESSION: 1. No acute intracranial abnormality. 2. ASPECTS is 10 3. Code stroke imaging results were communicated on 02/20/2022 at 12:16 pm to provider Quinn Axe via [facility] app text Electronically Signed   By: Franchot Gallo M.D.   On: 02/20/2022 12:16    Microbiology: Results for orders placed or performed during the hospital encounter of 02/20/22  SARS Coronavirus 2 by RT PCR (hospital order, performed in [facility]  hospital lab) *cepheid single result test* Anterior Nasal Swab     Status: None   Collection Time: 02/20/22  4:04 PM   Specimen: Anterior Nasal Swab  Result Value Ref Range Status   SARS Coronavirus 2 by RT PCR NEGATIVE NEGATIVE Final    Comment: (NOTE) SARS-CoV-2 target nucleic acids are NOT DETECTED.  The SARS-CoV-2 RNA is generally detectable in upper and lower respiratory specimens during the acute phase of infection. The lowest concentration of SARS-CoV-2 viral copies this assay can detect is 250 copies / mL. A negative result does not preclude SARS-CoV-2 infection and should not be used as the sole basis for treatment or other patient management decisions.  A negative result may occur with improper specimen collection / handling, submission of specimen other than nasopharyngeal swab, presence of viral mutation(s) within the areas targeted by this assay, and inadequate number of viral copies (<250 copies / mL). A negative result must be combined with clinical observations, patient history, and epidemiological information.  Fact Sheet for Patients:   https://www.patel.info/  Fact Sheet for Healthcare Providers: https://hall.com/  This test is not yet approved or  cleared by the [place] FDA and has been authorized for detection and/or diagnosis of SARS-CoV-2 by FDA under an Emergency Use Authorization (EUA).  This EUA will remain in effect (meaning this test can be used) for the duration of the COVID-19 declaration under Section 564(b)(1) of the Act, 21 U.S.C. section 360bbb-3(b)(1), unless the authorization is terminated or revoked sooner.  Performed at [facility], [address] [place], [place] [address]     Labs: CBC: Recent Labs  Lab 02/20/22 1235  WBC 5.8  NEUTROABS 3.2  HGB 12.3  HCT 37.5  MCV 90.6  PLT 093   Basic Metabolic Panel: Recent Labs  Lab 02/20/22 1235  NA 138  K 4.3  CL 108  CO2  26  GLUCOSE 110*  BUN 14  CREATININE 0.90  CALCIUM 9.5   Liver Function Tests: Recent Labs  Lab 02/20/22 1235  AST 16  ALT 11  ALKPHOS 58  BILITOT 0.6  PROT 6.2*  ALBUMIN 3.7   CBG: Recent Labs  Lab 02/20/22 1216  GLUCAP 101*    Discharge time spent: greater than 30 minutes.  Signed: Loletha Grayer, MD [facility] 02/22/2022

## 2022-02-22 NOTE — Plan of Care (Signed)
Please see progress note from yesterday for summary of hospitalization to date. CT venogram overnight was negative. Patient is feeling better and would like to go home.  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 Manuella Ghazi outpatient neurology after discharge  Su Monks, MD [facility] [phone]  If 7pm- 7am, please page neurology on call as listed in [place].

## 2022-02-23 NOTE — Telephone Encounter (Signed)
Spoke with patient, [facility] did put in referral to Dr Manuella Ghazi after TIA. Patient called them today and they did not have a referral yet and in case that does not go through can we send one over from just in case.

## 2022-02-26 ENCOUNTER — Telehealth: Payer: Self-pay

## 2022-02-26 NOTE — Telephone Encounter (Signed)
Aware, will see tomorrow as planned  If symptoms worsen overnight go to the ER Thanks for keeping me posted

## 2022-02-26 NOTE — Telephone Encounter (Signed)
Transition Care Management Follow-up Telephone Call Date of discharge and from where:TCM DC [facility] 02-22-22 Dx: TIA  How have you been since you were released from the hospital? Still having a headache Any questions or concerns? Yes- pt states the  systolic BP ranging 622-297 and diastolic BP is ranging from 95-106- I asked Patient to check BP and it was 153/106  Items Reviewed: Did the pt receive and understand the discharge instructions provided? Yes  Medications obtained and verified? Yes  Other? No  Any new allergies since your discharge? No  Dietary orders reviewed? Yes Do you have support at home? Yes   Home Care and Equipment/Supplies: Were home health services ordered? no If so, what is the name of the agency? na  Has the agency set up a time to come to the patient's home? not applicable Were any new equipment or medical supplies ordered?  No What is the name of the medical supply agency? na Were you able to get the supplies/equipment? not applicable Do you have any questions related to the use of the equipment or supplies? No  Functional Questionnaire: (I = Independent and D = Dependent) ADLs: I  Bathing/Dressing- I  Meal Prep- I  Eating- I  Maintaining continence- I  Transferring/Ambulation- I  Managing Meds- I  Follow up appointments reviewed:  PCP Hospital f/u appt confirmed? Yes  Scheduled to see Dr Glori Bickers on 02-27-22 @ [facility] f/u appt confirmed? No  . Are transportation arrangements needed? no If their condition worsens, is the pt aware to call PCP or go to the Emergency Dept.? yes Was the patient provided with contact information for the PCP's office or ED? Yes Was to pt encouraged to call back with questions or concerns? Yes

## 2022-02-27 ENCOUNTER — Encounter: Payer: Self-pay | Admitting: Family Medicine

## 2022-02-27 ENCOUNTER — Ambulatory Visit (INDEPENDENT_AMBULATORY_CARE_PROVIDER_SITE_OTHER): Payer: Medicare Other | Admitting: Family Medicine

## 2022-02-27 VITALS — BP 135/80 | HR 92 | Ht 60.0 in | Wt 120.6 lb

## 2022-02-27 DIAGNOSIS — E785 Hyperlipidemia, unspecified: Secondary | ICD-10-CM | POA: Diagnosis not present

## 2022-02-27 DIAGNOSIS — G4700 Insomnia, unspecified: Secondary | ICD-10-CM

## 2022-02-27 DIAGNOSIS — G43709 Chronic migraine without aura, not intractable, without status migrainosus: Secondary | ICD-10-CM

## 2022-02-27 DIAGNOSIS — I10 Essential (primary) hypertension: Secondary | ICD-10-CM | POA: Diagnosis not present

## 2022-02-27 DIAGNOSIS — F4320 Adjustment disorder, unspecified: Secondary | ICD-10-CM

## 2022-02-27 DIAGNOSIS — F4321 Adjustment disorder with depressed mood: Secondary | ICD-10-CM

## 2022-02-27 DIAGNOSIS — G459 Transient cerebral ischemic attack, unspecified: Secondary | ICD-10-CM

## 2022-02-27 NOTE — Assessment & Plan Note (Addendum)
Recent hospitalization for migraine and ? Of TIA Reviewed hospital records, lab results and studies in detail  Still has slight tingling of her L face  Exam is reassuring  Tolerating plavix and asa  Continues max dose atorvastatin  Calling for neuro appt  Reassuring echo and vasc imaging  bp is in normal range now

## 2022-02-27 NOTE — Assessment & Plan Note (Signed)
inst to re start trazodone

## 2022-02-27 NOTE — Assessment & Plan Note (Signed)
Disc goals for lipids and reasons to control them Rev last labs with pt Rev low sat fat diet in detail  Max dose atorvastatin  LDL  Of 98

## 2022-02-27 NOTE — Progress Notes (Signed)
Subjective:    Patient ID: Joann Buchanan, female    DOB: 1945/11/23, 77 y.o.   MRN: [id number]  HPI Pt presents for f/u of hosp for ? TIA  Wt Readings from Last 3 Encounters:  02/27/22 120 lb 9.6 oz (54.7 kg)  02/20/22 122 lb (55.3 kg)  08/30/21 120 lb (54.4 kg)   23.55 kg/m   Hospitalized from 02/20/22 to 02/22/22   She presented for L facial and L arm tingling and L headaches   Bp in ER was 172/95  CT head -no acute abd CTA head neck -no changes  CT venogram neg and sed rate low at 9 MRI of brain neg for acute event  Echo-nl EF  Was tx with labetelol in ER Given decadron and depakote with dompazine, toradol and benadyl for headache  Neurology started topamax for migraine prevention   A/p Assessment and Plan: * TIA (transient ischemic attack) Neurology recommends treating for TIA with aspirin lifelong, Lipitor and Plavix for total of 21 days   Migraine During the hospital course the patient was given Decadron and Depakote.  Also given Compazine Toradol and Benadryl.  Patient was feeling better upon discharge.  Case discussed with neurology and they wanted to start Topamax 25 mg nightly to prevent migraine.  Recommend limiting as needed medications to twice a week.  Triptans are contraindicated with somebody that has a TIA or stroke.  Recommend following up with neurology as outpatient for further treatment of migraines.   Insomnia Continue home trazodone 50-100 mg nightly as needed for sleep   Hyperlipidemia, mild LDL 98 on Lipitor.   GERD - PPI   History of melanoma - MRI brain negative.  No metastases seen.     Lab Results  Component Value Date   WBC 5.8 02/20/2022   HGB 12.3 02/20/2022   HCT 37.5 02/20/2022   MCV 90.6 02/20/2022   PLT 385 02/20/2022   Lab Results  Component Value Date   CREATININE 0.90 02/20/2022   BUN 14 02/20/2022   NA 138 02/20/2022   K 4.3 02/20/2022   CL 108 02/20/2022   CO2 26 02/20/2022   Lab Results  Component Value  Date   ALT 11 02/20/2022   AST 16 02/20/2022   ALKPHOS 58 02/20/2022   BILITOT 0.6 02/20/2022   Lab Results  Component Value Date   TSH 0.87 08/25/2021   Lab Results  Component Value Date   CHOL 207 (H) 08/25/2021   HDL 60 08/25/2021   LDLCALC 120 (H) 08/25/2021   LDLDIRECT 98 02/20/2022   TRIG 152 (H) 08/25/2021   CHOLHDL 3.5 08/25/2021   On max dose atorvastatin    Still has a headache  Not as bad as it was  Taking the topamax   Has a lot of anxiety  Has been tearful  Got worse when she was hospitalized  -now today not as tearful   Bp is lower today  BP Readings from Last 3 Encounters:  02/27/22 110/64  02/22/22 (!) 151/85  08/30/21 136/76    At home has been 160s-170s /90 s at home  Higher than her normal  Not checking before the hospitalization   Has a history of migraines in the past Severe in the past - not for a while  This one was different in that she did not have n/v   No clue what triggered this one  Possibly mood/ anxiety  Stress  She worries a lot  Since husband diet  Wants to be able to relax   Lab Results  Component Value Date   TSH 0.87 08/25/2021   Patient Active Problem List   Diagnosis Date Noted   Encounter for screening mammogram for breast cancer 10/30/2016    Priority: Low   TIA (transient ischemic attack) 02/20/2022   Insomnia 02/20/2022   Low serum vitamin B12 09/05/2021   Hyperlipidemia, mild 08/30/2021   Palpitations 08/30/2021   Chest discomfort 08/27/2021   Adverse effect of proton pump inhibitor 08/25/2021   Dysphagia 08/25/2021   Strain of forearm, right 12/14/2020   Grief reaction 11/30/2020   Prediabetes 05/09/2018   S/P cervical spinal fusion 12/02/2015   Osteopenia 10/25/2015   Estrogen deficiency 10/05/2015   Routine general medical examination at a health care facility 09/25/2015   Hand tingling 04/05/2015   Encounter for Medicare annual wellness exam 03/03/2013   OSTEOARTHRITIS, HANDS, BILATERAL  11/01/2009   Menopausal symptoms 03/23/2009   History of melanoma 02/04/2007   THYROMEGALY 02/04/2007   Migraine 02/04/2007   Essential hypertension 02/04/2007   Allergic rhinitis 02/04/2007   GERD 02/04/2007   Past Medical History:  Diagnosis Date   Allergic rhinitis    GERD (gastroesophageal reflux disease)    Goiter    ?    HTN (hypertension)    Hx of fibrocystic disease of breast    Melanoma ([place])    Migraine    Numbness and tingling    bilateral hands   Osteoarthritis of hand    PONV (postoperative nausea and vomiting)    Past Surgical History:  Procedure Laterality Date   ANTERIOR CERVICAL DECOMP/DISCECTOMY FUSION N/A 12/02/2015   Procedure: ANTERIOR CERVICAL DECOMPRESSION/DISCECTOMY FUSION CERVICAL FOUR-FIVE;  Surgeon: Eustace Moore, MD;  Location: [facility];  Service: Neurosurgery;  Laterality: N/A;   APPENDECTOMY     BREAST BIOPSY     normal   CATARACT EXTRACTION Right Jan 2017   CATARACT EXTRACTION W/ INTRAOCULAR LENS IMPLANT Left 2018   DEXA  7/07   negative   EMG     left upper arm- neg   ESOPHAGOGASTRODUODENOSCOPY     normal-24 hour PH probe (-)   EYE SURGERY  10/09   Left eye; for double vision   PARTIAL HYSTERECTOMY     Ovaries intact   TUBAL LIGATION     Social History   Tobacco Use   Smoking status: Never   Smokeless tobacco: Never  Substance Use Topics   Alcohol use: No    Alcohol/week: 0.0 standard drinks of alcohol   Drug use: No   Family History  Problem Relation Age of Onset   Breast cancer Mother        Died, 66   Other Father        Died, 62 - tree fell on him   Breast cancer Sister        Died, 59   Heart disease Sister        Died, 57   Healthy Son    Healthy Daughter    Allergies  Allergen Reactions   Sulfonamide Derivatives Anaphylaxis and Other (See Comments)    REACTION: unspecified Hives and swelling   Current Outpatient Medications on File Prior to Visit  Medication Sig Dispense Refill   ALPRAZolam (XANAX)  0.25 MG tablet Take 1 tablet (0.25 mg total) by mouth as needed. Prior to flights 10 tablet 1   aspirin 81 MG chewable tablet Chew 1 tablet (81 mg total) by mouth daily. 30 tablet 0  atorvastatin (LIPITOR) 80 MG tablet Take 1 tablet (80 mg total) by mouth daily. 30 tablet 0   clopidogrel (PLAVIX) 75 MG tablet Take 1 tablet (75 mg total) by mouth daily. 20 tablet 0   pantoprazole (PROTONIX) 40 MG tablet Take 1 tablet (40 mg total) by mouth daily. 30 tablet 0   Polyethyl Glycol-Propyl Glycol 0.4-0.3 % SOLN Place 1 drop into both eyes as needed.     topiramate (TOPAMAX) 25 MG tablet Take 1 tablet (25 mg total) by mouth at bedtime. 30 tablet 0   traZODone (DESYREL) 50 MG tablet Take 1-2 tablets (50-100 mg total) by mouth at bedtime as needed. for sleep 60 tablet 11   No current facility-administered medications on file prior to visit.     Review of Systems  Constitutional:  Negative for activity change, appetite change, fatigue, fever and unexpected weight change.  HENT:  Negative for congestion, ear pain, rhinorrhea, sinus pressure and sore throat.   Eyes:  Negative for pain, redness and visual disturbance.  Respiratory:  Negative for cough, shortness of breath and wheezing.   Cardiovascular:  Negative for chest pain and palpitations.  Gastrointestinal:  Negative for abdominal pain, blood in stool, constipation and diarrhea.  Endocrine: Negative for polydipsia and polyuria.  Genitourinary:  Negative for dysuria, frequency and urgency.  Musculoskeletal:  Negative for arthralgias, back pain and myalgias.  Skin:  Negative for pallor and rash.  Allergic/Immunologic: Negative for environmental allergies.  Neurological:  Positive for headaches. Negative for dizziness, syncope and facial asymmetry.       Very slight tingling on L cheek  Hematological:  Negative for adenopathy. Does not bruise/bleed easily.  Psychiatric/Behavioral:  Negative for decreased concentration and dysphoric mood. The  patient is not nervous/anxious.        Objective:   Physical Exam Constitutional:      General: She is not in acute distress.    Appearance: Normal appearance. She is well-developed. She is obese. She is not ill-appearing or diaphoretic.  HENT:     Head: Normocephalic and atraumatic.     Comments: No temporal or sinus tenderness    Right Ear: Tympanic membrane, ear canal and external ear normal.     Left Ear: Tympanic membrane, ear canal and external ear normal.     Nose: Nose normal.     Mouth/Throat:     Mouth: Mucous membranes are moist.     Pharynx: No oropharyngeal exudate.  Eyes:     General: No scleral icterus.       Right eye: No discharge.        Left eye: No discharge.     Conjunctiva/sclera: Conjunctivae normal.     Pupils: Pupils are equal, round, and reactive to light.     Comments: No nystagmus  Neck:     Thyroid: No thyromegaly.     Vascular: No carotid bruit or JVD.     Trachea: No tracheal deviation.  Cardiovascular:     Rate and Rhythm: Normal rate and regular rhythm.     Heart sounds: Normal heart sounds. No murmur heard. Pulmonary:     Effort: Pulmonary effort is normal. No respiratory distress.     Breath sounds: Normal breath sounds. No stridor. No wheezing, rhonchi or rales.  Abdominal:     General: Bowel sounds are normal. There is no distension.     Palpations: Abdomen is soft. There is no mass.     Tenderness: There is no abdominal tenderness.  Musculoskeletal:  General: No tenderness.     Cervical back: Full passive range of motion without pain, normal range of motion and neck supple. No tenderness.  Lymphadenopathy:     Cervical: No cervical adenopathy.  Skin:    General: Skin is warm and dry.     Coloration: Skin is not jaundiced or pale.     Findings: No bruising or rash.  Neurological:     Mental Status: She is alert and oriented to person, place, and time.     Cranial Nerves: No cranial nerve deficit, dysarthria or facial  asymmetry.     Sensory: No sensory deficit.     Motor: No weakness, tremor, atrophy or abnormal muscle tone.     Coordination: Coordination is intact. Romberg sign negative. Coordination normal.     Gait: Gait is intact. Gait normal.     Deep Tendon Reflexes: Reflexes are normal and symmetric. Reflexes normal.     Comments: No focal cerebellar signs   Face is slt less sensitive to light touch on the L side    Psychiatric:        Behavior: Behavior normal.        Thought Content: Thought content normal.           Assessment & Plan:   Problem List Items Addressed This Visit       Cardiovascular and Mediastinum   Essential hypertension    bp is much improved after hosp for headache and ?TIA bp in fair control at this time  BP Readings from Last 1 Encounters:  02/27/22 135/80   No medications needed Most recent labs reviewed  Disc lifstyle change with low sodium diet and exercise        Migraine - Primary    Hospitalized for this and ? TIA Reviewed hospital records, lab results and studies in detail  Clinically improved Headache persists but not as severe  Working on appt at [facility] neurology  Taking topamax since discharge and tolerating so far  inst to get back on trazodone for sleep/may also help headache  Reassuring imaging  Warned against excessive rescue tx  Disc imp of 64 oz per day Avoid excessive caffeine ER precautions reviewed       TIA (transient ischemic attack)    Recent hospitalization for migraine and ? Of TIA Reviewed hospital records, lab results and studies in detail  Still has slight tingling of her L face  Exam is reassuring  Tolerating plavix and asa  Continues max dose atorvastatin  Calling for neuro appt  Reassuring echo and vasc imaging  bp is in normal range now         Other   Grief reaction    Much more anxious recently with grief  Declines counseling ref  Will re start trazodone  She pushes herself also  Reviewed stressors/  coping techniques/symptoms/ support sources/ tx options and side effects in detail today   Declines other medication Disc imp of self care  Plans to travel and relax Disc meditation apps to try       Hyperlipidemia, mild    Disc goals for lipids and reasons to control them Rev last labs with pt Rev low sat fat diet in detail  Max dose atorvastatin  LDL  Of 98         Insomnia    inst to re start trazodone

## 2022-02-27 NOTE — Patient Instructions (Addendum)
Go ahead and schedule appt with neurology   Try to get 64 oz of fluid daily ( drink mostly water)  Avoid excessive caffeine in light of the headache   Go back on your trazodone   Check out the meditation app Calm  Go in app store and check it out   Let us know when you are open to seeing a counselor   For pain-tylenol is ok occasionally  Ice pack on your head and heat on your neck 10 minutes at a time    If symptoms return or worsen go to the ER

## 2022-02-27 NOTE — Assessment & Plan Note (Signed)
Much more anxious recently with grief  Declines counseling ref  Will re start trazodone  She pushes herself also  Reviewed stressors/ coping techniques/symptoms/ support sources/ tx options and side effects in detail today   Declines other medication Disc imp of self care  Plans to travel and relax Disc meditation apps to try

## 2022-02-27 NOTE — Assessment & Plan Note (Signed)
Hospitalized for this and ? TIA Reviewed hospital records, lab results and studies in detail  Clinically improved Headache persists but not as severe  Working on appt at [facility] neurology  Taking topamax since discharge and tolerating so far  inst to get back on trazodone for sleep/may also help headache  Reassuring imaging  Warned against excessive rescue tx  Disc imp of 64 oz per day Avoid excessive caffeine ER precautions reviewed

## 2022-02-27 NOTE — Assessment & Plan Note (Signed)
bp is much improved after hosp for headache and ?TIA bp in fair control at this time  BP Readings from Last 1 Encounters:  02/27/22 135/80   No medications needed Most recent labs reviewed  Disc lifstyle change with low sodium diet and exercise

## 2022-03-05 ENCOUNTER — Other Ambulatory Visit: Payer: Self-pay

## 2022-03-05 ENCOUNTER — Encounter: Payer: Self-pay | Admitting: Emergency Medicine

## 2022-03-05 ENCOUNTER — Inpatient Hospital Stay: Payer: Medicare Other | Admitting: Family Medicine

## 2022-03-05 ENCOUNTER — Telehealth: Payer: Self-pay | Admitting: Family Medicine

## 2022-03-05 ENCOUNTER — Emergency Department
Admission: EM | Admit: 2022-03-05 | Discharge: 2022-03-05 | Disposition: A | Discharge status: Home / Self Care | Payer: POS | Attending: Emergency Medicine | Admitting: Emergency Medicine

## 2022-03-05 ENCOUNTER — Emergency Department: Payer: POS

## 2022-03-05 DIAGNOSIS — U071 COVID-19: Secondary | ICD-10-CM | POA: Diagnosis not present

## 2022-03-05 DIAGNOSIS — R55 Syncope and collapse: Secondary | ICD-10-CM | POA: Diagnosis not present

## 2022-03-05 DIAGNOSIS — R519 Headache, unspecified: Secondary | ICD-10-CM | POA: Diagnosis present

## 2022-03-05 DIAGNOSIS — I10 Essential (primary) hypertension: Secondary | ICD-10-CM | POA: Diagnosis not present

## 2022-03-05 LAB — COMPREHENSIVE METABOLIC PANEL
ALT: 15 U/L (ref 0–44)
AST: 19 U/L (ref 15–41)
Albumin: 3.9 g/dL (ref 3.5–5.0)
Alkaline Phosphatase: 57 U/L (ref 38–126)
Anion gap: 6 (ref 5–15)
BUN: 17 mg/dL (ref 8–23)
CO2: 25 mmol/L (ref 22–32)
Calcium: 10.1 mg/dL (ref 8.9–10.3)
Chloride: 109 mmol/L (ref 98–111)
Creatinine, Ser: 0.88 mg/dL (ref 0.44–1.00)
GFR, Estimated: >60 mL/min (ref >60)
Glucose, Bld: 102 mg/dL — ABNORMAL HIGH (ref 70–99)
Potassium: 3.1 mmol/L — ABNORMAL LOW (ref 3.5–5.1)
Sodium: 140 mmol/L (ref 135–145)
Total Bilirubin: 0.5 mg/dL (ref 0.3–1.2)
Total Protein: 6.9 g/dL (ref 6.5–8.1)

## 2022-03-05 LAB — SARS CORONAVIRUS 2 BY RT PCR: SARS Coronavirus 2 by RT PCR: POSITIVE — AB

## 2022-03-05 LAB — CBC
HCT: 39.3 % (ref 36.0–46.0)
Hemoglobin: 12.8 g/dL (ref 12.0–15.0)
MCH: 29.6 pg (ref 26.0–34.0)
MCHC: 32.6 g/dL (ref 30.0–36.0)
MCV: 90.8 fL (ref 80.0–100.0)
Platelets: 320 10*3/uL (ref 150–400)
RBC: 4.33 MIL/uL (ref 3.87–5.11)
RDW: 12.8 % (ref 11.5–15.5)
WBC: 7.9 10*3/uL (ref 4.0–10.5)
nRBC: 0 % (ref 0.0–0.2)

## 2022-03-05 NOTE — ED Provider Triage Note (Addendum)
Emergency Medicine Provider Triage Evaluation Note  Joann Buchanan, a 76 y.o. female  was evaluated in triage.  Pt with history of TIA (on Plavix x 20d), HTN, hyperlipidemia, presents to the ED with complaints of confusion difficult to complete sentences on 8/27.  Patient with a recent ED admission on 8/17 for TIA, presents at the advice of her PCP.  Review of Systems  Positive: Hx of AMS Negative: FCS  Physical Exam  BP (!) 147/77 (BP Location: Left Arm)   Pulse 82   Temp 98.3 F (36.8 C) (Oral)   Resp 18   Ht 5' (1.524 m)   Wt 53.5 kg   SpO2 100%   BMI 23.05 kg/m  Gen:   Awake, no distress  NAD Resp:  Normal effort CTA MSK:   Moves extremities without difficulty  CVA:  RRR  Medical Decision Making  Medically screening exam initiated at 12:58 PM.  Appropriate orders placed.  Joann Buchanan was informed that the remainder of the evaluation will be completed by another provider, this initial triage assessment does not replace that evaluation, and the importance of remaining in the ED until their evaluation is complete.  Patient presents to the ED at the advice of her PCP for transient altered mental status yesterday.   Joann Needles, PA-C 03/05/22 1301    Joann Buchanan, [place] 03/05/22 1303

## 2022-03-05 NOTE — Discharge Instructions (Signed)
CT scan is not showing any acute findings.  Your blood tests are normal.  Your vital signs are normal.  Make sure to drink plenty of fluids, take Tylenol if you have a fever, and take your other normal medications as prescribed.  Follow-up with your regular doctor within the next week.  Return to the ER for new, worsening, or persistent severe weakness, lightheadedness, feeling like you are going to pass out, confusion, vomiting, difficulty breathing, or any other new or worsening symptoms that concern you.

## 2022-03-05 NOTE — Telephone Encounter (Signed)
Patient called in stating that she tested positive for Covid last week. She stated that between her medications and Covid, she has been wobbling and can't walk a straight line. Blood pressure on Saturday 162/62 and around 2:00 it was 92/59, Sunday it was 118/71 and this morning it was 118/65. Sent over to triage.

## 2022-03-05 NOTE — ED Notes (Signed)
Patient transported to CT 

## 2022-03-05 NOTE — ED Triage Notes (Signed)
Pt tested positive for COVID with home test. Pt symptoms were sinus congestion and headache.  Called PCP today and was advised to come to ED. PT said earlier felt dizzy earlier today but not at time of triage. Also complains of weakness and said yesterday was so weak unable to walk across the floor.

## 2022-03-05 NOTE — Telephone Encounter (Signed)
I spoke with pt; pt said on 04/04/22 she could not walk straight; pt had some confusion and everything seemed funny; pt said she could not remember the last word of her sentences. Today pt said she feels better, pt can walk today; now BP 118/66 P 92. Pt said she does feel weak today.  Pt d/c from hospital on 02/22/22 with TIA. Pt said she does not want to go to ED for eval. Pt said if her condition changes or worsens at all she will go to ED. Pt said someone is with her. Pt already has video visit scheduled on 03/06/22 at [facility]. Pt returned from [place] and was dx with + covid home test on 03/03/22. Pt does not have H/A or CP. I spoke with Shapale CMA and she agrees VV is probabIy not what is needed and I continued to explain to pt that she needs to be eval today with symptoms she has been having. Pt said OK to cancel appt on 03/06/22 with Dr Glori Bickers and pt will go to [facility] ED today. Sending note to Dr Glori Bickers and Willmar CMA.

## 2022-03-05 NOTE — Telephone Encounter (Signed)
Agree with that advisement  Thanks  She is in ER now-will watch for correspondence

## 2022-03-05 NOTE — Telephone Encounter (Signed)
[facility] - Client TELEPHONE ADVICE RECORD [facility] Patient Name: Joann Buchanan Gender: Female DOB: 08-03-1945 Age: 76 Y 68 M 9 D Return Phone Number: [phone] (Primary), [phone] (Secondary) Address: City/ State/ Zip: [place] [address] Client [facility] - Client Client Site [place] Provider Glori Bickers, Roque Lias - MD Contact Type Call Who Is Calling Patient / Member / Family / Caregiver Call Type Triage / Clinical Relationship To Patient Self Return Phone Number [phone] (Secondary) Chief Complaint CONFUSION - new onset Reason for Call Symptomatic / Request for Health Information Initial Comment Caller states pos for COVID last week; can't walk straight line, wobbling; BP has been fluctuating; 118/65 this morning; some confusion-- past few days trouble finishing sentences; Translation No Nurse Assessment Nurse: Windle Guard, RN, Lesa Date/Time (Eastern Time): 03/05/2022 9:26:36 AM Confirm and document reason for call. If symptomatic, describe symptoms. ---Caller states she is having difficulty walking. Her blood pressure is 117/70. She has Covid. She is coughing, one ear is stopped up and her throat is scratchy Does the patient have any new or worsening symptoms? ---Yes Will a triage be completed? ---Yes Related visit to physician within the last 2 weeks? ---No Does the PT have any chronic conditions? (i.e. diabetes, asthma, this includes High risk factors for pregnancy, etc.) ---Yes List chronic conditions. ---Migraines, TIA symptoms Is this a behavioral health or substance abuse call? ---No Guidelines Guideline Title Affirmed Question Affirmed Notes Nurse Date/Time (Eastern Time) Neurologic Deficit [1] SEVERE weakness (i.e., unable to walk or barely able to walk, requires support) AND [2] new-onset or worsening Conner, RN, Lesa 03/05/2022 9:32:20 AM PLEASE NOTE: All  timestamps contained within this report are represented as [place] Standard Time. CONFIDENTIALTY NOTICE: This fax transmission is intended only for the addressee. It contains information that is legally privileged, confidential or otherwise protected from use or disclosure. If you are not the intended recipient, you are strictly prohibited from reviewing, disclosing, copying using or disseminating any of this information or taking any action in reliance on or regarding this information. If you have received this fax in error, please notify us immediately by telephone so that we can arrange for its return to [place]. Phone: [phone], Toll-Free: [phone], Fax: [phone] Page: 2 of 2 Call Id: [id number] [place]. Time [name] Time) Disposition Final User 03/05/2022 9:23:57 AM Send to Urgent Mikael Spray 03/05/2022 9:35:05 AM Call EMS 911 Now Yes Conner, RN, Lesa 03/05/2022 9:35:33 AM 911 Outcome Documentation Conner, RN, Lesa Reason: refuses Final Disposition 03/05/2022 9:35:05 AM Call EMS 911 Now Yes Conner, RN, Emmaline Kluver Caller Disagree/Comply Disagree Caller Understands Yes PreDisposition [place] Advice Given Per Guideline CALL EMS 911 NOW: * Immediate medical attention is needed. You need to hang up and call 911 (or an ambulance). CARE ADVICE given per Neurologic Deficit (Adult) guideline. Comments User: [name] Date/Time [name] Time): 03/05/2022 9:21:46 AM [name] provided info; Referrals GO TO FACILITY REFUSE

## 2022-03-05 NOTE — ED Provider Notes (Signed)
[facility] Provider Note    Event Date/Time   First MD Initiated Contact with Patient 03/05/22 1403     (approximate)   History   Headache   HPI  Joann Buchanan is a 76 y.o. female with history of hypertension, hyperlipidemia, GERD, migraine, and TIA who presents with generalized weakness, lightheadedness, and episodes of confusion and near syncope over the last several days.  The patient states that she has been sick for about 4 days and took a COVID test which was positive.  2 days ago she felt very weak and slept almost the entire day.  She has had intermittent low-grade temperatures to 99 but no true fever that she has noted.  She has had some cough but no significant shortness of breath.  She denies any nausea or vomiting.  She states that a few times she felt lightheaded and her blood pressure was in the 90s.  She also had one episode while she was lying down where she felt like she was awake but asleep at the same time, felt her heart rate slowing down, and then felt like if she did not wake yourself up she might go into a coma.  The patient states she felt a little bit better yesterday, and is feeling significantly better today.  She only feels slightly dizzy but has had no further confusion or feeling like she will pass out.  The denies any headache, focal weakness or numbness, difficulty speaking, changes in vision, or other acute neurologic symptoms.   Physical Exam   Triage Vital Signs: ED Triage Vitals  Enc Vitals Group     BP 03/05/22 1253 (!) 147/77     Pulse Rate 03/05/22 1253 82     Resp 03/05/22 1253 18     Temp 03/05/22 1253 98.3 F (36.8 C)     Temp Source 03/05/22 1253 Oral     SpO2 03/05/22 1253 100 %     Weight 03/05/22 1253 118 lb (53.5 kg)     Height 03/05/22 1253 5' (1.524 m)     Head Circumference --      Peak Flow --      Pain Score 03/05/22 1308 4     Pain Loc --      Pain Edu? --      Excl. in [place]? --     Most recent  vital signs: Vitals:   03/05/22 1253 03/05/22 1441  BP: (!) 147/77 (!) 140/77  Pulse: 82 80  Resp: 18 18  Temp: 98.3 F (36.8 C)   SpO2: 100% 100%     General: Alert and oriented, well-appearing. CV:  Good peripheral perfusion.  Resp:  Normal effort.  Abd:  No distention.  Other:  EOMI.  PERRLA.  No facial droop.  Motor and sensory intact in all extremities.  No pronator drift.  No ataxia on finger-to-nose.  Normal gait.  Moist mucous membranes.   ED Results / Procedures / Treatments   Labs (all labs ordered are listed, but only abnormal results are displayed) Labs Reviewed  SARS CORONAVIRUS 2 BY RT PCR - Abnormal; Notable for the following components:      Result Value   SARS Coronavirus 2 by RT PCR POSITIVE (*)    All other components within normal limits  COMPREHENSIVE METABOLIC PANEL - Abnormal; Notable for the following components:   Potassium 3.1 (*)    Glucose, Bld 102 (*)    All other components within normal limits  CBC  URINALYSIS, ROUTINE W REFLEX MICROSCOPIC     EKG  ED ECG REPORT I, Arta Silence, the attending physician, personally viewed and interpreted this ECG.  Date: 03/05/2022 EKG Time: 1258 Rate: 77 Rhythm: normal sinus rhythm QRS Axis: normal Intervals: normal ST/T Wave abnormalities: normal Narrative Interpretation: no evidence of acute ischemia    RADIOLOGY  CT head: I independently viewed and interpreted the images; there is no ICH or other acute abnormality.  Radiology report confirms no acute findings.  PROCEDURES:  Critical Care performed: No  Procedures   MEDICATIONS ORDERED IN ED: Medications - No data to display   IMPRESSION / MDM / [place] / ED COURSE  I reviewed the triage vital signs and the nursing notes.  76 year old female with PMH as noted above presents with intermittent dizziness, weakness, and an episode of delirium and/or near syncope 2 days ago in the context of testing positive for  COVID-19.  I reviewed the past medical records.  The patient was just admitted less than 2 weeks ago.  Per the hospitalist discharge summary from 02/22/2022 she presented with left-sided facial and arm tingling and was diagnosed with possible TIA.  CT, CTA, and MRI of the brain were all negative.  On exam today the patient is alert and oriented, and well-appearing.  Her vital signs are normal.  Physical exam including thorough neurologic exam is normal.  Differential diagnosis includes, but is not limited to, COVID encephalopathy, delirium related to fever and/or COVID-19 infection, dehydration, electrolyte abnormality, vasovagal episode.  I do not suspect cardiac etiology.  Overall, the patient felt better yesterday than the day before, and is feeling significantly better today, so it appears that her COVID infection is resolving.  Patient's presentation is most consistent with acute presentation with potential threat to life or bodily function.  At this time, basic labs are unremarkable.  There is no leukocytosis or anemia.  Electrolytes are normal except for borderline low potassium.  COVID PCR is positive as expected.  EKG is normal.  CT head shows no acute findings.  At this time there is no evidence of ongoing COVID encephalopathy or any other life-threatening complication.  There is no evidence of recurrent TIA or acute stroke.  I did discuss with the patient the possibility of obtaining an MRI to fully rule this out, although she has no focal neurologic symptoms.  She states that she is feeling significantly better today than she did yesterday and declines further evaluation.  She states she would prefer to go home.  I think that this is reasonable based on the ED evaluation.  I counseled her on the results of the work-up and plan of care.  I advised her follow-up with her primary care provider.  I gave strict return precautions and she expressed understanding.    FINAL CLINICAL  IMPRESSION(S) / ED DIAGNOSES   Final diagnoses:  COVID-19  Near syncope     Rx / DC Orders   ED Discharge Orders     None        Note:  This document was prepared using Dragon voice recognition software and may include unintentional dictation errors.    Arta Silence, MD 03/05/22 1454

## 2022-03-06 ENCOUNTER — Telehealth: Payer: Medicare Other | Admitting: Family Medicine

## 2022-03-13 ENCOUNTER — Inpatient Hospital Stay: Payer: Medicare Other | Admitting: Family Medicine

## 2022-08-22 ENCOUNTER — Ambulatory Visit (INDEPENDENT_AMBULATORY_CARE_PROVIDER_SITE_OTHER): Payer: Medicare Other | Admitting: Family Medicine

## 2022-08-22 ENCOUNTER — Encounter: Payer: Self-pay | Admitting: Family Medicine

## 2022-08-22 VITALS — BP 136/82 | HR 78 | Temp 97.7°F | Ht 59.0 in | Wt 122.0 lb

## 2022-08-22 DIAGNOSIS — G43709 Chronic migraine without aura, not intractable, without status migrainosus: Secondary | ICD-10-CM | POA: Diagnosis not present

## 2022-08-22 DIAGNOSIS — M542 Cervicalgia: Secondary | ICD-10-CM | POA: Diagnosis not present

## 2022-08-22 DIAGNOSIS — R202 Paresthesia of skin: Secondary | ICD-10-CM | POA: Diagnosis not present

## 2022-08-22 DIAGNOSIS — I10 Essential (primary) hypertension: Secondary | ICD-10-CM

## 2022-08-22 DIAGNOSIS — Z981 Arthrodesis status: Secondary | ICD-10-CM | POA: Diagnosis not present

## 2022-08-22 MED ORDER — METHOCARBAMOL 500 MG PO TABS: 500.0000 mg | ORAL_TABLET | Freq: Three times a day (TID) | ORAL | 2 refills | Status: DC | PRN

## 2022-08-22 MED ORDER — PREDNISONE 10 MG PO TABS: ORAL_TABLET | ORAL | 0 refills | Status: DC

## 2022-08-22 NOTE — Assessment & Plan Note (Signed)
Worse on R, and worse with prolonged flexion in setting of DDD and past discectomy and fusion in 2017  Some bony and muscular tenderness on exam Also radicular symptoms R hand   Px prednisone taper Also methocarbammol  Pt wishes to f/u with her former neurologist (Dr Posey Pronto) and Val Riles tx before seeing neuro surg again   Rev last notes/ op from Dr Ronnald Ramp Rev last notes from Dr Posey Pronto Rev last MRI report today   Reassuring exam today  Disc ER precautions / for s/s of neuro change

## 2022-08-22 NOTE — Patient Instructions (Addendum)
Start back on an 81 mg aspirin daily for stroke prevention   I refilled the methocarbamol for muscle pain/neck pain and headache   Take the prednisone taper for pain and inflammation  It may make you feel hyper and hungry   I will place a referral to neurology Dr Posey Pronto  Call their office tomorrow to schedule   If you symptoms suddenly get severe go to the ER

## 2022-08-22 NOTE — Assessment & Plan Note (Signed)
Right more than L  Similar to the past with disc protrusion (then had cervical discectomy and fusion in 2017)  Reviewed last neuro surg note and imaging  (Dr Sherley Bounds at [facility] neuro [facility] and spine)   Will try course of prednisone Methocarbamol for pain  She would like to see her prev neurologist Dr Posey Pronto as well

## 2022-08-22 NOTE — Assessment & Plan Note (Addendum)
Headaches are improved ? Of TIA in past  Did not f/u with neuro originally as planned She would like to see Dr Posey Pronto if possible (has been there before)  Ref done for this and also neck pain  Also adv pt to return to 81 mg asa daily in light of possible TIA in past

## 2022-08-22 NOTE — Assessment & Plan Note (Signed)
bp is much improved after hosp for headache and ?TIA bp in fair control at this time  BP Readings from Last 1 Encounters:  08/22/22 136/82   No medications needed Most recent labs reviewed  Disc lifstyle change with low sodium diet and exercise

## 2022-08-22 NOTE — Progress Notes (Signed)
Subjective:    Patient ID: Joann Buchanan, female    DOB: [birth year], 77 y.o.   MRN: [id number]  HPI Pt presents with lower neck pain   Wt Readings from Last 3 Encounters:  08/22/22 122 lb (55.3 kg)  03/05/22 118 lb (53.5 kg)  02/27/22 120 lb 9.6 oz (54.7 kg)   24.64 kg/m  Vitals:   08/22/22 1520  BP: 136/82  Pulse: 78  Temp: 97.7 F (36.5 C)  SpO2: 100%    If she holds her head down to work (at desk) Gets pain at occiput area -both sides but worse on the right  Hard to straighten out her neck then/stiff This is all getting worse over the past 6 month   The neck makes the headache   She called Dr Posey Pronto   Having more tingling in her hand - esp at night and esp in the R side  This feels like it did in 2017   For headache she takes methocarbamol  Had some left over   She was supposed to see Dr Manuella Ghazi for headache    She did have cervical surgery in 2017  ant cervical deomp/discectomy and fusion  Dr Sherley Bounds at [facility] and spine  Last MRI neck was 2017 Findings   Associated Dx: Hyperreflexia; Paresthesia of hand, bilateral; Hoffman sign present; Cervicalgia; Hand weakness    Study Result CLINICAL DATA: Six week history of neck and right radicular symptoms.  EXAM: MRI CERVICAL SPINE WITHOUT AND WITH CONTRAST  TECHNIQUE: Multiplanar and multiecho pulse sequences of the cervical spine, to include the craniocervical junction and cervicothoracic junction, were obtained according to standard protocol without and with intravenous contrast.  CONTRAST: 52m MULTIHANCE GADOBENATE DIMEGLUMINE 529 MG/ML IV SOLN  COMPARISON: None.  FINDINGS: Normal overall alignment of the cervical vertebral bodies. They demonstrate normal marrow signal. Mild C1-2 degenerative changes with early pannus formation but no mass effect on the upper cervical cord. The cervical spinal cord demonstrates normal signal intensity. No cord lesions or syrinx. Advanced multilevel  bilateral facet disease.  C2-3: No disc protrusions, spinal or foraminal stenosis. Posteriorly just below the C2-3 disc space on the left side is a small dural lesion. This measures approximately 6 mm and does show some contrast enhancement. This is most likely a small meningioma. Routine surveillance is suggested.  C3-4: Mild annular bulge, osteophytic ridging and uncinate spurring with slight flattening of the ventral thecal sac. No significant spinal or foraminal stenosis.  C4-5: Moderate-sized right paracentral disc protrusion with focal mass effect on the right side of the thecal sac likely account for the patient's right arm symptoms. There is no foraminal stenosis.  C5-6: Mild annular bulge and mild uncinate spurring changes but no significant spinal or foraminal stenosis.  C6-7: Mild bulging annulus, osteophytic ridging and uncinate spurring but no significant spinal or foraminal stenosis.  C7-T1: No significant findings.  IMPRESSION: 1. Moderate-sized right paracentral disc protrusion at C4-5 likely accounting for the patient's right radicular symptoms. 2. 6 mm intraspinal lesion just below the C2-3 disc space level most consistent with a small meningioma. No mass effect at this time. Routine surveillance is suggested in 1 year.   Electronically Signed By: PMarijo SanesM.D. On: 10/25/2015 13:38   This result contains additional information. Click to view the full report.  Patient Active Problem List   Diagnosis Date Noted   Encounter for screening mammogram for breast cancer 10/30/2016    Priority: Low   Neck pain 08/22/2022  History of TIA (transient ischemic attack) 02/20/2022   Insomnia 02/20/2022   Low serum vitamin B12 09/05/2021   Hyperlipidemia, mild 08/30/2021   Palpitations 08/30/2021   Chest discomfort 08/27/2021   Adverse effect of proton pump inhibitor 08/25/2021   Dysphagia 08/25/2021   Strain of forearm, right 12/14/2020   Grief reaction  11/30/2020   Prediabetes 05/09/2018   S/P cervical spinal fusion 12/02/2015   Osteopenia 10/25/2015   Estrogen deficiency 10/05/2015   Routine general medical examination at a health care facility 09/25/2015   Hand tingling 04/05/2015   Encounter for Medicare annual wellness exam 03/03/2013   OSTEOARTHRITIS, HANDS, 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    HTN (hypertension)    Hx of fibrocystic disease of breast    Melanoma ([place])    Migraine    Numbness and tingling    bilateral hands   Osteoarthritis of hand    PONV (postoperative nausea and vomiting)    Past Surgical History:  Procedure Laterality Date   ANTERIOR CERVICAL DECOMP/DISCECTOMY FUSION N/A 12/02/2015   Procedure: ANTERIOR CERVICAL DECOMPRESSION/DISCECTOMY FUSION CERVICAL FOUR-FIVE;  Surgeon: Eustace Moore, MD;  Location: [place] [facility];  Service: Neurosurgery;  Laterality: N/A;   APPENDECTOMY     BREAST BIOPSY     normal   CATARACT EXTRACTION Right Jan 2017   CATARACT EXTRACTION W/ INTRAOCULAR LENS IMPLANT Left 2018   DEXA  7/07   negative   EMG     left upper arm- neg   ESOPHAGOGASTRODUODENOSCOPY     normal-24 hour PH probe (-)   EYE SURGERY  10/09   Left eye; for double vision   PARTIAL HYSTERECTOMY     Ovaries intact   TUBAL LIGATION     Social History   Tobacco Use   Smoking status: Never   Smokeless tobacco: Never  Substance Use Topics   Alcohol use: No    Alcohol/week: 0.0 standard drinks of alcohol   Drug use: No   Family History  Problem Relation Age of Onset   Breast cancer Mother        Died, 91   Other Father        Died, 66 - tree fell on him   Breast cancer Sister        Died, 5    Heart disease Sister        Died, 56   Healthy Son    Healthy Daughter    Allergies  Allergen Reactions   Sulfonamide Derivatives Anaphylaxis and Other (See Comments)    REACTION: unspecified Hives and swelling   Current Outpatient Medications on File Prior to Visit  Medication Sig Dispense Refill   ALPRAZolam (XANAX) 0.25 MG tablet Take 0.25 mg by mouth daily as needed (for airplane flights).     Polyethyl Glycol-Propyl Glycol 0.4-0.3 % SOLN Place 1 drop into both eyes as needed.     traZODone (DESYREL) 50 MG tablet Take 1-2 tablets (50-100 mg total) by mouth at bedtime as needed. for sleep 60 tablet 11   topiramate (TOPAMAX) 25 MG tablet Take 1 tablet (25 mg total) by mouth at bedtime. 30 tablet 0   No current facility-administered medications  on file prior to visit.    Review of Systems  Constitutional:  Negative for activity change, appetite change, fatigue, fever and unexpected weight change.  HENT:  Negative for congestion, ear pain, rhinorrhea, sinus pressure and sore throat.   Eyes:  Negative for pain, redness and visual disturbance.  Respiratory:  Negative for cough, shortness of breath and wheezing.   Cardiovascular:  Negative for chest pain and palpitations.  Gastrointestinal:  Negative for abdominal pain, blood in stool, constipation and diarrhea.  Endocrine: Negative for polydipsia and polyuria.  Genitourinary:  Negative for dysuria, frequency and urgency.  Musculoskeletal:  Positive for neck pain and neck stiffness. Negative for arthralgias, back pain and myalgias.  Skin:  Negative for pallor and rash.  Allergic/Immunologic: Negative for environmental allergies.  Neurological:  Positive for numbness. Negative for dizziness, tremors, syncope, facial asymmetry, weakness, light-headedness and headaches.       Tingling in hands-primarily the R No loss of strength  Hematological:  Negative for adenopathy. Does not bruise/bleed easily.  Psychiatric/Behavioral:  Negative  for decreased concentration and dysphoric mood. The patient is not nervous/anxious.        Objective:   Physical Exam Constitutional:      General: She is not in acute distress.    Appearance: Normal appearance. She is well-developed and normal weight. She is not ill-appearing or diaphoretic.  HENT:     Head: Normocephalic and atraumatic.     Mouth/Throat:     Mouth: Mucous membranes are moist.  Eyes:     General: No scleral icterus.    Conjunctiva/sclera: Conjunctivae normal.     Pupils: Pupils are equal, round, and reactive to light.  Neck:     Thyroid: No thyromegaly.     Vascular: No carotid bruit or JVD.     Comments: Tender over lower CS and also R cervical musculature  Limited rom due to fusion  Flexion and R rotation cause most discomfort  Cardiovascular:     Rate and Rhythm: Normal rate and regular rhythm.     Heart sounds: Normal heart sounds.     No gallop.  Pulmonary:     Effort: Pulmonary effort is normal. No respiratory distress.     Breath sounds: Normal breath sounds. No wheezing or rales.  Abdominal:     General: There is no distension or abdominal bruit.     Palpations: Abdomen is soft.  Musculoskeletal:        General: No swelling.     Cervical back: Normal range of motion and neck supple. Tenderness present. No rigidity.     Right lower leg: No edema.     Left lower leg: No edema.  Lymphadenopathy:     Cervical: No cervical adenopathy.  Skin:    General: Skin is warm and dry.     Coloration: Skin is not pale.     Findings: No bruising, erythema or rash.  Neurological:     Mental Status: She is alert.     Cranial Nerves: No cranial nerve deficit.     Motor: No weakness.     Coordination: Coordination normal.     Gait: Gait normal.     Deep Tendon Reflexes: Reflexes are normal and symmetric. Reflexes normal.     Comments: Some decreased sens to light touch on all r fingers  No change in grip  Psychiatric:        Mood and Affect: Mood normal.            Assessment &  Plan:   Problem List Items Addressed This Visit       Cardiovascular and Mediastinum   Essential hypertension    bp is much improved after hosp for headache and ?TIA bp in fair control at this time  BP Readings from Last 1 Encounters:  08/22/22 136/82  No medications needed Most recent labs reviewed  Disc lifstyle change with low sodium diet and exercise        Migraine    Headaches are improved ? Of TIA in past  Did not f/u with neuro originally as planned She would like to see Dr Posey Pronto if possible (has been there before)  Ref done for this and also neck pain  Also adv pt to return to 81 mg asa daily in light of possible TIA in past       Relevant Medications   methocarbamol (ROBAXIN) 500 MG tablet   Other Relevant Orders   Ambulatory referral to Neurology     Other   Hand tingling    Right more than L  Similar to the past with disc protrusion (then had cervical discectomy and fusion in 2017)  Reviewed last neuro surg note and imaging  (Dr Sherley Bounds at [facility] surg and spine)   Will try course of prednisone Methocarbamol for pain  She would like to see her prev neurologist Dr Posey Pronto as well       Relevant Orders   Ambulatory referral to Neurology   Neck pain - Primary    Worse on R, and worse with prolonged flexion in setting of DDD and past discectomy and fusion in 2017  Some bony and muscular tenderness on exam Also radicular symptoms R hand   Px prednisone taper Also methocarbammol  Pt wishes to f/u with her former neurologist (Dr Posey Pronto) and Val Riles tx before seeing neuro surg again   Rev last notes/ op from Dr Ronnald Ramp Rev last notes from Dr Posey Pronto Rev last MRI report today   Reassuring exam today  Disc ER precautions / for s/s of neuro change        Relevant Orders   Ambulatory referral to Neurology   S/P cervical spinal fusion   Relevant Orders   Ambulatory referral to Neurology

## 2022-08-23 ENCOUNTER — Encounter: Payer: Self-pay | Admitting: Neurology

## 2022-08-27 ENCOUNTER — Telehealth: Payer: Self-pay | Admitting: Family Medicine

## 2022-08-27 NOTE — Telephone Encounter (Signed)
Called patient to schedule Medicare Annual Wellness Visit (AWV). Left message for patient to call back and schedule Medicare Annual Wellness Visit (AWV).  Last date of AWV: 09/04/2021  Please schedule an appointment at any time with [facility].  If any questions, please contact me at [phone].  Thank you ,  [place] Direct Dial: [phone]

## 2022-08-27 NOTE — Telephone Encounter (Signed)
Patient called and had to cancel and asked for a call back to reschedule

## 2022-09-14 NOTE — Progress Notes (Unsigned)
[facility] Clinic Note - Initial Visit   Date: 09/17/2022   Joann Buchanan MRN: [id number] DOB: June 06, 1946   Dear Dr. Glori Bickers:  Thank you for your kind referral of [name] for consultation of headaches. Although her history is well known to you, please allow [place] to reiterate it for the purpose of our medical record. The patient was accompanied to the clinic by self.    Joann Buchanan is a 77 y.o. right-handed female with  GERD, HTN, cervical myelopathy at C4-5 s/p ACDF and migraines presenting for evaluation of migraines.   IMPRESSION/PLAN: Chronic migraine - Start topiramate '25mg'$  daily x 1 month, then increase to 1 tablet twice daily  2.  Cervicalgia - Start physical therapy - Continue methocarbamol '500mg'$  daily  3.  Medication overuse headaches - Limit ibuprofen, tylenol, Aleve, Excedrin to twice per week  4.  Right hand tingling/numbness  - NCS/EMG of the right hand ?entrapment neuropathy vs residual from cervical myelopathy  5.  History of cervical canal stenosis at C4-5 s/p ACDF (2017).  Return to clinic in 2 months    ------------------------------------------------------------- History of present illness: She was previously seen by me in 2014 - 2017 for migraines, occipital neuralgia, and cervical myelopathy.  She underwent C4-5 ACDF in 2017.  Regarding her migraines, these started in her late 58s and stopped in her 59s.  Migraines started intermittently in her 6s and slowly intensified.Pain is usually over the left side of her head or at the base of her neck. Pain is described as sharp, dull/grinding pain.    Headaches occur about 3 times per week, which lasts about 3 days.  There is associated photophobia, phonophobia, nausea, and vomiting. She has visual auras after the headache, described wavy lines and blurry vision and there is occasional tingling over the left side of her face. Headaches can wake her up from sleeping.  She takes  ibuprofen daily for the past year.  She takes imitex nasal spray about once per month.  She has some relief with methocarbamol daily.  She was started on topiramate '25mg'$  daily, but self-discontinued it. She has tried propranolol and antidepressant medication.  Last month, she was given prednisone course which did not help.  Prior testing in August including MRI brain, CTA head and neck, and CT venogram was negative.   She also complains of right hand tingling.  Symptoms have been ongoing since prior to her neck surgery.    Out-side paper records, electronic medical record, and images have been reviewed where available and summarized as:  MRI brain wo contrast 02/20/2022:  No acute intracranial process. No evidence of acute or subacute infarct.  CT venogram 02/21/2022:  normal  CT head 03/05/2022:  Normal  CTA head and neck 02/20/2022:  Normal CT angiography of the head and neck vessels. No demonstrable atherosclerotic change or other vascular pathology. No stenosis or occlusion.    Lab Results  Component Value Date   HGBA1C 5.1 02/20/2022   Lab Results  Component Value Date   VITAMINB12 269 08/25/2021   Lab Results  Component Value Date   TSH 0.87 08/25/2021   Lab Results  Component Value Date   ESRSEDRATE 9 02/21/2022    Past Medical History:  Diagnosis Date   Allergic rhinitis    GERD (gastroesophageal reflux disease)    Goiter    ?    HTN (hypertension)    Hx of fibrocystic disease of breast    Melanoma ([place])  Migraine    Numbness and tingling    bilateral hands   Osteoarthritis of hand    PONV (postoperative nausea and vomiting)     Past Surgical History:  Procedure Laterality Date   ANTERIOR CERVICAL DECOMP/DISCECTOMY FUSION N/A 12/02/2015   Procedure: ANTERIOR CERVICAL DECOMPRESSION/DISCECTOMY FUSION CERVICAL FOUR-FIVE;  Surgeon: Eustace Moore, MD;  Location: [facility];  Service: Neurosurgery;  Laterality: N/A;   APPENDECTOMY     BREAST BIOPSY     normal    CATARACT EXTRACTION Right Jan 2017   CATARACT EXTRACTION W/ INTRAOCULAR LENS IMPLANT Left 2018   DEXA  7/07   negative   EMG     left upper arm- neg   ESOPHAGOGASTRODUODENOSCOPY     normal-24 hour PH probe (-)   EYE SURGERY  10/09   Left eye; for double vision   PARTIAL HYSTERECTOMY     Ovaries intact   TUBAL LIGATION       Medications:  Outpatient Encounter Medications as of 09/17/2022  Medication Sig   ALPRAZolam (XANAX) 0.25 MG tablet Take 0.25 mg by mouth daily as needed (for airplane flights).   methocarbamol (ROBAXIN) 500 MG tablet Take 1 tablet (500 mg total) by mouth every 8 (eight) hours as needed for muscle spasms (headache, neck pain). Caution of sedation   Polyethyl Glycol-Propyl Glycol 0.4-0.3 % SOLN Place 1 drop into both eyes as needed.   topiramate (TOPAMAX) 25 MG tablet Take 1 tablet (25 mg total) by mouth at bedtime.   traZODone (DESYREL) 50 MG tablet Take 1-2 tablets (50-100 mg total) by mouth at bedtime as needed. for sleep   [DISCONTINUED] predniSONE (DELTASONE) 10 MG tablet Take 4 pills once daily by mouth for 3 days, then 3 pills daily for 3 days, then 2 pills daily for 3 days then 1 pill daily for 3 days then stop (Patient not taking: Reported on 09/17/2022)   No facility-administered encounter medications on file as of 09/17/2022.    Allergies:  Allergies  Allergen Reactions   Sulfonamide Derivatives Anaphylaxis and Other (See Comments)    REACTION: unspecified Hives and swelling    Family History: Family History  Problem Relation Age of Onset   Breast cancer Mother        Died, 54   Other Father        Died, 58 - tree fell on him   Breast cancer Sister        Died, 32   Heart disease Sister        Died, 87   Healthy Son    Healthy Daughter     Social History: Social History   Tobacco Use   Smoking status: Never   Smokeless tobacco: Never  Substance Use Topics   Alcohol use: No    Alcohol/week: 0.0 standard drinks of alcohol   Drug  use: No   Social History   Social History Narrative   Retired Water quality scientist for [employer]   Married; 2 children; 3 grand children         Right Handed    Lives in a one story home. Lives alone.        Vital Signs:  BP 135/84   Pulse 98   Ht '4\' 11"'$  (1.499 m)   Wt 117 lb (53.1 kg)   SpO2 99%   BMI 23.63 kg/m   Neurological Exam: MENTAL STATUS including orientation to time, place, person, recent and remote memory, attention span and concentration, language, and fund of  knowledge is normal.  Speech is not dysarthric.  CRANIAL NERVES: II:  No visual field defects.     III-IV-VI: Pupils equal round and reactive to light.  Normal conjugate, extra-ocular eye movements in all directions of gaze.  No nystagmus.  No ptosis.   V:  Normal facial sensation.    VII:  Normal facial symmetry and movements.   VIII:  Normal hearing and vestibular function.   IX-X:  Normal palatal movement.   XI:  Normal shoulder shrug and head rotation.   XII:  Normal tongue strength and range of motion, no deviation or fasciculation.  MOTOR:  Motor strength is 5/5 throughout. No atrophy, fasciculations or abnormal movements.  No pronator drift.   MSRs: Reflexes are brisk 2+/4 throughout  SENSORY:  Normal and symmetric perception of light touch, pinprick, vibration, and proprioception.  Romberg's sign absent.   COORDINATION/GAIT: Normal finger-to- nose-finger.  Intact rapid alternating movements bilaterally.   Gait narrow based and stable. Tandem and stressed gait intact.   Total time spent reviewing records, interview, history/exam, documentation, and coordination of care on day of encounter:  60 min    Thank you for allowing me to participate in patient's care.  If I can answer any additional questions, I would be pleased to do so.    Sincerely,    Francenia Chimenti K. Posey Pronto, DO

## 2022-09-17 ENCOUNTER — Encounter: Payer: Self-pay | Admitting: Neurology

## 2022-09-17 ENCOUNTER — Other Ambulatory Visit: Payer: Self-pay | Admitting: Family Medicine

## 2022-09-17 ENCOUNTER — Ambulatory Visit (INDEPENDENT_AMBULATORY_CARE_PROVIDER_SITE_OTHER): Payer: Medicare Other | Admitting: Neurology

## 2022-09-17 VITALS — BP 135/84 | HR 98 | Ht 59.0 in | Wt 117.0 lb

## 2022-09-17 DIAGNOSIS — M542 Cervicalgia: Secondary | ICD-10-CM

## 2022-09-17 DIAGNOSIS — G43E01 Chronic migraine with aura, not intractable, with status migrainosus: Secondary | ICD-10-CM

## 2022-09-17 DIAGNOSIS — Z981 Arthrodesis status: Secondary | ICD-10-CM | POA: Diagnosis not present

## 2022-09-17 DIAGNOSIS — R200 Anesthesia of skin: Secondary | ICD-10-CM | POA: Diagnosis not present

## 2022-09-17 DIAGNOSIS — R202 Paresthesia of skin: Secondary | ICD-10-CM

## 2022-09-17 DIAGNOSIS — G4440 Drug-induced headache, not elsewhere classified, not intractable: Secondary | ICD-10-CM

## 2022-09-17 MED ORDER — TOPIRAMATE 25 MG PO TABS: ORAL_TABLET | ORAL | 5 refills | Status: DC

## 2022-09-17 NOTE — Telephone Encounter (Signed)
Last filled 08/25/21 #60 tabs/ 11 refills, last OV was on 08/22/22 to discuss neuro referral

## 2022-09-17 NOTE — Patient Instructions (Addendum)
Start topiramate '25mg'$  daily x 1 month, then increase to 1 tablet twice daily Start physical therapy Continue methocarbamol '500mg'$  daily Limit ibuprofen, tylenol, Aleve, Excedrin to twice per week  Nerve testing of the right hand  Return to clinic in 2 months  [place] (EMG/NCS) INSTRUCTIONS  How to Prepare The neurologist conducting the EMG will need to know if you have certain medical conditions. Tell the neurologist and other EMG lab personnel if you: Have a pacemaker or any other electrical medical device Take blood-thinning medications Have hemophilia, a blood-clotting disorder that causes prolonged bleeding Bathing Take a shower or bath shortly before your exam in order to remove oils from your skin. Don't apply lotions or creams before the exam.  What to Expect You'll likely be asked to change into a hospital gown for the procedure and lie down on an examination table. The following explanations can help you understand what will happen during the exam.  Electrodes. The neurologist or a technician places surface electrodes at various locations on your skin depending on where you're experiencing symptoms. Or the neurologist may insert needle electrodes at different sites depending on your symptoms.  Sensations. The electrodes will at times transmit a tiny electrical current that you may feel as a twinge or spasm. The needle electrode may cause discomfort or pain that usually ends shortly after the needle is removed. If you are concerned about discomfort or pain, you may want to talk to the neurologist about taking a short break during the exam.  Instructions. During the needle EMG, the neurologist will assess whether there is any spontaneous electrical activity when the muscle is at rest - activity that isn't present in healthy muscle tissue - and the degree of activity when you slightly contract the muscle.  He or she will give you instructions on  resting and contracting a muscle at appropriate times. Depending on what muscles and nerves the neurologist is examining, he or she may ask you to change positions during the exam.  After your EMG You may experience some temporary, minor bruising where the needle electrode was inserted into your muscle. This bruising should fade within several days. If it persists, contact your primary care doctor.

## 2022-09-27 ENCOUNTER — Other Ambulatory Visit: Payer: Self-pay | Admitting: Family Medicine

## 2022-09-27 DIAGNOSIS — Z1231 Encounter for screening mammogram for malignant neoplasm of breast: Secondary | ICD-10-CM

## 2022-10-01 ENCOUNTER — Telehealth: Payer: Self-pay | Admitting: Family Medicine

## 2022-10-01 NOTE — Telephone Encounter (Signed)
Contacted [name] to schedule their annual wellness visit. Appointment made for 10/25/2022.  [facility] Direct Dial: [phone]

## 2022-10-01 NOTE — Telephone Encounter (Signed)
Called patient to schedule Medicare Annual Wellness Visit (AWV). Left message for patient to call back and schedule Medicare Annual Wellness Visit (AWV).  Last date of AWV: 08/25/2021  Please schedule an appointment at any time with [facility].  If any questions, please contact me at [phone].  Thank you ,  [facility] Direct Dial: [phone]

## 2022-10-12 ENCOUNTER — Other Ambulatory Visit: Payer: Self-pay

## 2022-10-12 ENCOUNTER — Ambulatory Visit (INDEPENDENT_AMBULATORY_CARE_PROVIDER_SITE_OTHER): Payer: Medicare Other | Admitting: Neurology

## 2022-10-12 DIAGNOSIS — M542 Cervicalgia: Secondary | ICD-10-CM | POA: Diagnosis not present

## 2022-10-12 DIAGNOSIS — Z981 Arthrodesis status: Secondary | ICD-10-CM

## 2022-10-12 DIAGNOSIS — R200 Anesthesia of skin: Secondary | ICD-10-CM | POA: Diagnosis not present

## 2022-10-12 DIAGNOSIS — M4802 Spinal stenosis, cervical region: Secondary | ICD-10-CM

## 2022-10-12 DIAGNOSIS — R202 Paresthesia of skin: Secondary | ICD-10-CM | POA: Diagnosis not present

## 2022-10-12 NOTE — Procedures (Signed)
  [facility]  [address] [place], [address]  [place], [place] [address] Tel: [phone] Fax: [phone] Test Date:  10/12/2022  Patient: Joann Buchanan DOB: 15-Feb-1946 Physician: Nita Sickle, DO  Sex: Female Height: 4\' 11"  Ref Phys: Nita Sickle, DO  ID#: [id number]   Technician:    History: This is a 77 year old female referred for evaluation of right hand paresthesias.  NCV & EMG Findings: Extensive electrodiagnostic testing of the right upper extremity shows:  Right median sensory response shows prolonged latency (4.2 ms).  Right ulnar sensory responses within normal limits. Right median motor response shows prolonged latency (4.5 ms) and reduced amplitude (4.8 mV).  Right ulnar motor responses within normal limits.   Chronic motor axon loss changes are seen affecting the right abductor pollicis brevis muscle, without accompanying active denervation.    Impression: Right median neuropathy at or distal to the wrist, consistent with a clinical diagnosis of carpal tunnel syndrome.  Overall, these findings are moderate-to-severe in degree electrically.   ___________________________ Nita Sickle, DO    Nerve Conduction Studies   Stim Site NR Peak (ms) Norm Peak (ms) O-P Amp (V) Norm O-P Amp  Right Median Anti Sensory (2nd Digit)  32 C  Wrist    *4.2 <3.8 12.2 >10  Right Ulnar Anti Sensory (5th Digit)  32 C  Wrist    2.3 <3.2 20.8 >5     Stim Site NR Onset (ms) Norm Onset (ms) O-P Amp (mV) Norm O-P Amp Site1 Site2 Delta-0 (ms) Dist (cm) Vel (m/s) Norm Vel (m/s)  Right Median Motor (Abd Poll Brev)  32 C  Wrist    *4.5 <4.0 *4.8 >5 Elbow Wrist 4.7 24.0 51 >50  Elbow    9.2  4.8         Right Ulnar Motor (Abd Dig Minimi)  32 C  Wrist    2.0 <3.1 10.5 >7 B Elbow Wrist 3.2 20.0 63 >50  B Elbow    5.2  9.4  A Elbow B Elbow 1.5 10.0 67 >50  A Elbow    6.7  9.3          Electromyography   Side Muscle Ins.Act Fibs Fasc Recrt Amp Dur Poly Activation Comment  Right  1stDorInt Nml Nml Nml Nml Nml Nml Nml Nml N/A  Right Abd Poll Brev Nml Nml Nml *2- *1+ *1+ *1+ Nml N/A  Right PronatorTeres Nml Nml Nml Nml Nml Nml Nml Nml N/A  Right Biceps Nml Nml Nml Nml Nml Nml Nml Nml N/A  Right Triceps Nml Nml Nml Nml Nml Nml Nml Nml N/A  Right Deltoid Nml Nml Nml Nml Nml Nml Nml Nml N/A      Waveforms:

## 2022-10-12 NOTE — Progress Notes (Signed)
Follow-up Visit   Date: 10/12/2022    Robley FriesDoris C Hincapie MRN: [id number] DOB: [birth year]    Robley FriesDoris C Halbur is a 77 y.o. right-handed Caucasian female with  GERD, HTN, cervical myelopathy at C4-5 s/p ACDF returning to the clinic for EMG and complains of worsening headaches.  The patient was accompanied to the clinic by self.  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  Cervicalgia, no improvement with PT or muscle relaxants. Exam shows brisk reflexes throughout.  She has history of cervical myelopathy at C4-5 s/p ACDF, given her worsening neck pain and headaches, I will check MRI cervical spine to evaluate for adjacent disease.  She may continue to take topiramate 25mg  at bedtime and methocarbamol daily as needed.  Further recommendations pending results.   --------------------------------------------- History of present illness: She was previously seen by me in 2014 - 2017 for migraines, occipital neuralgia, and cervical myelopathy.  She underwent C4-5 ACDF in 2017.  Regarding her migraines, these started in her late 6920s and stopped in her 4240s.  Migraines started intermittently in her 5460s and slowly intensified.Pain is usually over the left side of her head or at the base of her neck. Pain is described as sharp, dull/grinding pain.    Headaches occur about 3 times per week, which lasts about 3 days.  There is associated photophobia, phonophobia, nausea, and vomiting. She has visual auras after the headache, described wavy lines and blurry vision and there is occasional tingling over the left side of her face. Headaches can wake her up from sleeping.  She takes ibuprofen daily for the past year.  She takes imitex nasal spray about once per month.  She has some relief with methocarbamol daily.  She was started on  topiramate 25mg  daily, but self-discontinued it. She has tried propranolol and antidepressant medication.  Last month, she was given prednisone course which did not help.  Prior testing in August including MRI brain, CTA head and neck, and CT venogram was negative.    She also complains of right hand tingling.  Symptoms have been ongoing since prior to her neck surgery.   UPDATE 10/12/2022:  She continues to have daily headaches.  She was taking topiramate 25mg  in the morning but felt that it made her dizzy, so now taking it at bedtime. Dizziness has improved.  She has many questions whether she can take trazadone with topiramate, which I told her would be fine.  She briefly stropped trazadone and has been feeling more nervous and anxious.    Medications:  Current Outpatient Medications on File Prior to Visit  Medication Sig Dispense Refill   ALPRAZolam (XANAX) 0.25 MG tablet Take 0.25 mg by mouth daily as needed (for airplane flights).     methocarbamol (ROBAXIN) 500 MG tablet Take 1 tablet (500 mg total) by mouth every 8 (eight) hours as needed for muscle spasms (headache, neck pain). Caution of sedation 30 tablet 2   Polyethyl Glycol-Propyl Glycol 0.4-0.3 % SOLN Place 1 drop into both eyes as needed.     topiramate (TOPAMAX) 25 MG tablet Take 1 tablet tablet daily x 1 month, then increase to 1 tablet twice daily. 60 tablet 5   traZODone (DESYREL) 50 MG tablet TAKE 1-2 TABLETS (50-100 MG TOTAL) BY MOUTH AT BEDTIME AS NEEDED. FOR SLEEP 60 tablet 11   No current facility-administered medications  on file prior to visit.    Allergies:  Allergies  Allergen Reactions   Sulfonamide Derivatives Anaphylaxis and Other (See Comments)    REACTION: unspecified Hives and swelling    Vital Signs:  There were no vitals taken for this visit.    Exam deferred  Data: NCS/EMG of the right arm 10/12/2022: Right median neuropathy at or distal to the wrist, consistent with a clinical diagnosis of carpal  tunnel syndrome.  Overall, these findings are moderate-to-severe in degree electrically.   Thank you for allowing me to participate in patient's care.  If I can answer any additional questions, I would be pleased to do so.    Sincerely,    Keilan Nichol K. Allena Katz, DO

## 2022-10-15 ENCOUNTER — Telehealth: Payer: Self-pay

## 2022-10-15 NOTE — Telephone Encounter (Signed)
Received a Engineer, technical sales from [place] at [facility]. Joann Buchanan is calling because the provider who is seeing patient there would like to set up a time to speak with Dr. Allena Katz in regards to patient.

## 2022-10-16 NOTE — Telephone Encounter (Signed)
Returned call to [facility].  Therapist not available.

## 2022-10-25 ENCOUNTER — Ambulatory Visit (INDEPENDENT_AMBULATORY_CARE_PROVIDER_SITE_OTHER): Payer: Medicare Other

## 2022-10-25 VITALS — BP 112/62 | HR 84 | Ht 59.0 in | Wt 117.6 lb

## 2022-10-25 DIAGNOSIS — Z0000 Encounter for general adult medical examination without abnormal findings: Secondary | ICD-10-CM | POA: Diagnosis not present

## 2022-10-25 NOTE — Patient Instructions (Signed)
Joann Buchanan , Thank you for taking time to come for your Medicare Wellness Visit. I appreciate your ongoing commitment to your health goals. Please review the following plan we discussed and let me know if I can assist you in the future.   These are the goals we discussed:  Goals       Increase physical activity      When weather permits, I will continue walking for 3 miles daily 2 days per week.       Patient Stated (pt-stated)      03/24/19, Patient wants to walk more daily and drink more water daily       Patient Stated      Would like to drink more water, have a better eating routine and exercise more      Patient Stated      No new goals        This is a list of the screening recommended for you and due dates:  Health Maintenance  Topic Date Due   Zoster (Shingles) Vaccine (1 of 2) Never done   DTaP/Tdap/Td vaccine (2 - Tdap) 05/08/2014   Mammogram  04/16/2018   Pneumonia Vaccine (2 of 2 - PPSV23 or PCV20) 10/17/2018   Flu Shot  02/07/2023   Medicare Annual Wellness Visit  10/25/2023   Cologuard (Stool DNA test)  09/27/2024   DEXA scan (bone density measurement)  Completed   Hepatitis C Screening: USPSTF Recommendation to screen - Ages 26-79 yo.  Completed   HPV Vaccine  Aged Out   COVID-19 Vaccine  Discontinued    Advanced directives: Please bring a copy of your health care power of attorney and living will to the office to be added to your chart at your convenience.   Conditions/risks identified: none  Next appointment: Follow up in one year for your annual wellness visit 10/29/23 @ 2:30 telephone   Preventive Care 65 Years and Older, Female Preventive care refers to lifestyle choices and visits with your health care provider that can promote health and wellness. What does preventive care include? A yearly physical exam. This is also called an annual well check. Dental exams once or twice a year. Routine eye exams. Ask your health care provider how often you  should have your eyes checked. Personal lifestyle choices, including: Daily care of your teeth and gums. Regular physical activity. Eating a healthy diet. Avoiding tobacco and drug use. Limiting alcohol use. Practicing safe sex. Taking low-dose aspirin every day. Taking vitamin and mineral supplements as recommended by your health care provider. What happens during an annual well check? The services and screenings done by your health care provider during your annual well check will depend on your age, overall health, lifestyle risk factors, and family history of disease. Counseling  Your health care provider may ask you questions about your: Alcohol use. Tobacco use. Drug use. Emotional well-being. Home and relationship well-being. Sexual activity. Eating habits. History of falls. Memory and ability to understand (cognition). Work and work Astronomer. Reproductive health. Screening  You may have the following tests or measurements: Height, weight, and BMI. Blood pressure. Lipid and cholesterol levels. These may be checked every 5 years, or more frequently if you are over 86 years old. Skin check. Lung cancer screening. You may have this screening every year starting at age 79 if you have a 30-pack-year history of smoking and currently smoke or have quit within the past 15 years. Fecal occult blood test (FOBT) of the stool. You  may have this test every year starting at age 55. Flexible sigmoidoscopy or colonoscopy. You may have a sigmoidoscopy every 5 years or a colonoscopy every 10 years starting at age 70. Hepatitis C blood test. Hepatitis B blood test. Sexually transmitted disease (STD) testing. Diabetes screening. This is done by checking your blood sugar (glucose) after you have not eaten for a while (fasting). You may have this done every 1-3 years. Bone density scan. This is done to screen for osteoporosis. You may have this done starting at age 28. Mammogram. This may  be done every 1-2 years. Talk to your health care provider about how often you should have regular mammograms. Talk with your health care provider about your test results, treatment options, and if necessary, the need for more tests. Vaccines  Your health care provider may recommend certain vaccines, such as: Influenza vaccine. This is recommended every year. Tetanus, diphtheria, and acellular pertussis (Tdap, Td) vaccine. You may need a Td booster every 10 years. Zoster vaccine. You may need this after age 29. Pneumococcal 13-valent conjugate (PCV13) vaccine. One dose is recommended after age 66. Pneumococcal polysaccharide (PPSV23) vaccine. One dose is recommended after age 11. Talk to your health care provider about which screenings and vaccines you need and how often you need them. This information is not intended to replace advice given to you by your health care provider. Make sure you discuss any questions you have with your health care provider. Document Released: 07/22/2015 Document Revised: 03/14/2016 Document Reviewed: 04/26/2015 Elsevier Interactive Patient Education  2017 [employer].  Fall Prevention in the Home Falls can cause injuries. They can happen to people of all ages. There are many things you can do to make your home safe and to help prevent falls. What can I do on the outside of my home? Regularly fix the edges of walkways and driveways and fix any cracks. Remove anything that might make you trip as you walk through a door, such as a raised step or threshold. Trim any bushes or trees on the path to your home. Use bright outdoor lighting. Clear any walking paths of anything that might make someone trip, such as rocks or tools. Regularly check to see if handrails are loose or broken. Make sure that both sides of any steps have handrails. Any raised decks and porches should have guardrails on the edges. Have any leaves, snow, or ice cleared regularly. Use sand or salt  on walking paths during winter. Clean up any spills in your garage right away. This includes oil or grease spills. What can I do in the bathroom? Use night lights. Install grab bars by the toilet and in the tub and shower. Do not use towel bars as grab bars. Use non-skid mats or decals in the tub or shower. If you need to sit down in the shower, use a plastic, non-slip stool. Keep the floor dry. Clean up any water that spills on the floor as soon as it happens. Remove soap buildup in the tub or shower regularly. Attach bath mats securely with double-sided non-slip rug tape. Do not have throw rugs and other things on the floor that can make you trip. What can I do in the bedroom? Use night lights. Make sure that you have a light by your bed that is easy to reach. Do not use any sheets or blankets that are too big for your bed. They should not hang down onto the floor. Have a firm chair that has side  arms. You can use this for support while you get dressed. Do not have throw rugs and other things on the floor that can make you trip. What can I do in the kitchen? Clean up any spills right away. Avoid walking on wet floors. Keep items that you use a lot in easy-to-reach places. If you need to reach something above you, use a strong step stool that has a grab bar. Keep electrical cords out of the way. Do not use floor polish or wax that makes floors slippery. If you must use wax, use non-skid floor wax. Do not have throw rugs and other things on the floor that can make you trip. What can I do with my stairs? Do not leave any items on the stairs. Make sure that there are handrails on both sides of the stairs and use them. Fix handrails that are broken or loose. Make sure that handrails are as long as the stairways. Check any carpeting to make sure that it is firmly attached to the stairs. Fix any carpet that is loose or worn. Avoid having throw rugs at the top or bottom of the stairs. If you  do have throw rugs, attach them to the floor with carpet tape. Make sure that you have a light switch at the top of the stairs and the bottom of the stairs. If you do not have them, ask someone to add them for you. What else can I do to help prevent falls? Wear shoes that: Do not have high heels. Have rubber bottoms. Are comfortable and fit you well. Are closed at the toe. Do not wear sandals. If you use a stepladder: Make sure that it is fully opened. Do not climb a closed stepladder. Make sure that both sides of the stepladder are locked into place. Ask someone to hold it for you, if possible. Clearly mark and make sure that you can see: Any grab bars or handrails. First and last steps. Where the edge of each step is. Use tools that help you move around (mobility aids) if they are needed. These include: Canes. Walkers. Scooters. Crutches. Turn on the lights when you go into a dark area. Replace any light bulbs as soon as they burn out. Set up your furniture so you have a clear path. Avoid moving your furniture around. If any of your floors are uneven, fix them. If there are any pets around you, be aware of where they are. Review your medicines with your doctor. Some medicines can make you feel dizzy. This can increase your chance of falling. Ask your doctor what other things that you can do to help prevent falls. This information is not intended to replace advice given to you by your health care provider. Make sure you discuss any questions you have with your health care provider. Document Released: 04/21/2009 Document Revised: 12/01/2015 Document Reviewed: 07/30/2014 Elsevier Interactive Patient Education  2017 [employer].

## 2022-10-25 NOTE — Progress Notes (Signed)
Subjective:   Joann Buchanan is a 77 y.o. female who presents for Medicare Annual (Subsequent) preventive examination.  Review of Systems      Cardiac Risk Factors include: advanced age (>66men, >2 women);hypertension     Objective:    Today's Vitals   10/25/22 0803 10/25/22 0806  BP: 112/62   Pulse: 84   SpO2: 98%   Weight: 117 lb 9.6 oz (53.3 kg)   Height:  (1.499 m)   PainSc:  5    Body mass index is 23.75 kg/m.     10/25/2022    8:18 AM 09/17/2022    8:00 AM 03/05/2022    1:09 PM 02/20/2022    6:05 PM 02/20/2022   12:34 PM 08/25/2021    1:18 PM 03/24/2019    1:55 PM  Advanced Directives  Does Patient Have a Medical Advance Directive? Yes No No No No No No  Type of Estate agent of [place];Living will        Copy of Healthcare Power of Attorney in Chart? No - copy requested        Would patient like information on creating a medical advance directive?   No - Patient declined No - Patient declined  Yes (MAU/Ambulatory/Procedural Areas - Information given) No - Patient declined    Current Medications (verified) Outpatient Encounter Medications as of 10/25/2022  Medication Sig   fluticasone (FLONASE) 50 MCG/ACT nasal spray Place into both nostrils daily. As needed   methocarbamol (ROBAXIN) 500 MG tablet Take 1 tablet (500 mg total) by mouth every 8 (eight) hours as needed for muscle spasms (headache, neck pain). Caution of sedation   Polyethyl Glycol-Propyl Glycol 0.4-0.3 % SOLN Place 1 drop into both eyes as needed.   topiramate (TOPAMAX) 25 MG tablet Take 1 tablet tablet daily x 1 month, then increase to 1 tablet twice daily.   traZODone (DESYREL) 50 MG tablet TAKE 1-2 TABLETS (50-100 MG TOTAL) BY MOUTH AT BEDTIME AS NEEDED. FOR SLEEP   ALPRAZolam (XANAX) 0.25 MG tablet Take 0.25 mg by mouth daily as needed (for airplane flights). (Patient not taking: Reported on 10/25/2022)   No facility-administered encounter medications on file as of  10/25/2022.    Allergies (verified) Sulfonamide derivatives   History: Past Medical History:  Diagnosis Date   Allergic rhinitis    GERD (gastroesophageal reflux disease)    Goiter    ?    HTN (hypertension)    Hx of fibrocystic disease of breast    Melanoma    Migraine    Numbness and tingling    bilateral hands   Osteoarthritis of hand    PONV (postoperative nausea and vomiting)    Past Surgical History:  Procedure Laterality Date   ANTERIOR CERVICAL DECOMP/DISCECTOMY FUSION N/A 12/02/2015   Procedure: ANTERIOR CERVICAL DECOMPRESSION/DISCECTOMY FUSION CERVICAL FOUR-FIVE;  Surgeon: Tia Alert, MD;  Location: [facility];  Service: Neurosurgery;  Laterality: N/A;   APPENDECTOMY     BREAST BIOPSY     normal   CATARACT EXTRACTION Right Jan 2017   CATARACT EXTRACTION W/ INTRAOCULAR LENS IMPLANT Left 2018   DEXA  7/07   negative   EMG     left upper arm- neg   ESOPHAGOGASTRODUODENOSCOPY     normal-24 hour PH probe (-)   EYE SURGERY  10/09   Left eye; for double vision   PARTIAL HYSTERECTOMY     Ovaries intact   TUBAL LIGATION     Family History  Problem Relation Age of Onset   Breast cancer Mother        Died, 51   Other Father        Died, 41 - tree fell on him   Breast cancer Sister        Died, 80   Heart disease Sister        Died, 24   Healthy Son    Healthy Daughter    Social History   Socioeconomic History   Marital status: Widowed    Spouse name: Not on file   Number of children: 2   Years of education: Not on file   Highest education level: Not on file  Occupational History   Occupation: Retired    Associate Professor: UNEMPLOYED  Tobacco Use   Smoking status: Never   Smokeless tobacco: Never  Substance and Sexual Activity   Alcohol use: No    Alcohol/week: 0.0 standard drinks of alcohol   Drug use: No   Sexual activity: Not Currently  Other Topics Concern   Not on file  Social History Narrative   Retired Naval architect for [employer]    Married; 2 children; 3 grand children         Right Handed    Lives in a one story home. Lives alone.       Social Determinants of Health   Financial Resource Strain: Low Risk  (10/25/2022)   Overall Financial Resource Strain (CARDIA)    Difficulty of Paying Living Expenses: Not hard at all  Food Insecurity: No Food Insecurity (10/25/2022)   Hunger Vital Sign    Worried About Running Out of Food in the Last Year: Never true    Ran Out of Food in the Last Year: Never true  Transportation Needs: No Transportation Needs (10/25/2022)   PRAPARE - Administrator, Civil Service (Medical): No    Lack of Transportation (Non-Medical): No  Physical Activity: Sufficiently Active (10/25/2022)   Exercise Vital Sign    Days of Exercise per Week: 7 days    Minutes of Exercise per Session: 60 min  Stress: No Stress Concern Present (10/25/2022)   [employer] of Occupational Health - Occupational Stress Questionnaire    Feeling of Stress : Not at all  Social Connections: Moderately Integrated (10/25/2022)   Social Connection and Isolation Panel [NHANES]    Frequency of Communication with Friends and Family: More than three times a week    Frequency of Social Gatherings with Friends and Family: More than three times a week    Attends Religious Services: More than 4 times per year    Active Member of [employer] or Organizations: Yes    Attends Banker Meetings: More than 4 times per year    Marital Status: Widowed    Tobacco Counseling Counseling given: Not Answered   Clinical Intake:  Pre-visit preparation completed: Yes  Pain : 0-10 Pain Score: 5  Pain Type: Acute pain Pain Location: Head Pain Descriptors / Indicators: Constant, Sharp     Nutritional Risks: None Diabetes: No  How often do you need to have someone help you when you read instructions, pamphlets, or other written materials from your doctor or pharmacy?: 1 - Never  Diabetic? no  Interpreter  Needed?: No  Information entered by :: C.Judythe Postema LPN   Activities of Daily Living    10/25/2022    8:20 AM 02/20/2022    6:05 PM  In your present state of health, do you have  any difficulty performing the following activities:  Hearing? 0 0  Vision? 0 0  Difficulty concentrating or making decisions? 0 0  Walking or climbing stairs? 0 0  Dressing or bathing? 0 0  Doing errands, shopping? 0 0  Preparing Food and eating ? N   Using the Toilet? N   In the past six months, have you accidently leaked urine? N   Do you have problems with loss of bowel control? N   Managing your Medications? N   Managing your Finances? N   Housekeeping or managing your Housekeeping? N     Patient Care Team: Tower, Audrie Gallus, MD as PCP - General Holli Humbles, MD as Consulting Physician (Ophthalmology) Antonieta Iba, MD as Consulting Physician (Cardiology) Tia Alert, MD as Consulting Physician (Neurosurgery) Glendale Chard, DO as Consulting Physician (Neurology)  Indicate any recent Medical Services you may have received from other than [facility] providers in the past year (date may be approximate).     Assessment:   This is a routine wellness examination for Melinna.  Hearing/Vision screen Hearing Screening - Comments:: No aids Vision Screening - Comments:: [facility] - [facility]  Dietary issues and exercise activities discussed: Current Exercise Habits: Home exercise routine, Type of exercise: strength training/weights;walking, Time (Minutes): 60, Frequency (Times/Week): 7, Weekly Exercise (Minutes/Week): 420, Exercise limited by: None identified   Goals Addressed             This Visit's Progress    Patient Stated       No new goals       Depression Screen    10/25/2022    8:17 AM 08/25/2021    1:26 PM 03/24/2019    1:57 PM 10/10/2017   12:58 PM 10/09/2016    1:54 PM 10/05/2015   10:32 AM 10/05/2015   10:15 AM  PHQ 2/9 Scores  PHQ - 2 Score 0 0 0 1 0 0 0  PHQ- 9 Score   1  1       Fall Risk    10/25/2022    8:19 AM 09/17/2022    8:00 AM 08/25/2021    1:25 PM 03/24/2019    1:57 PM 10/10/2017   12:58 PM  Fall Risk   Falls in the past year? 1 0 0 0 No  Number falls in past yr: 0 0 0 0   Comment lost balance stepping up on stair and fell      Injury with Fall? 0 0 0    Risk for fall due to : No Fall Risks  No Fall Risks    Follow up Falls prevention discussed;Falls evaluation completed Falls evaluation completed Falls prevention discussed Falls evaluation completed;Falls prevention discussed     FALL RISK PREVENTION PERTAINING TO THE HOME:  Any stairs in or around the home? No  If so, are there any without handrails?  N/A Home free of loose throw rugs in walkways, pet beds, electrical cords, etc? Yes  Adequate lighting in your home to reduce risk of falls? Yes   ASSISTIVE DEVICES UTILIZED TO PREVENT FALLS:  Life alert? No  Use of a cane, walker or w/c? No  Grab bars in the bathroom? No  Shower chair or bench in shower? Yes  Elevated toilet seat or a handicapped toilet? No   TIMED UP AND GO:  Was the test performed? Yes .  Length of time to ambulate 10 feet: 20 sec.   Gait steady and fast without use of assistive device  Cognitive Function:    03/24/2019    1:59 PM 10/10/2017    1:04 PM 10/09/2016    1:54 PM 10/05/2015   10:16 AM  MMSE - Mini Mental State Exam  Orientation to time 5 5 5 5   Orientation to Place 5 5 5 5   Registration 3 3 3 3   Attention/ Calculation 5 0 0 0  Recall 3 3 3 3   Language- name 2 objects  0 0 0  Language- repeat 1 1 1 1   Language- follow 3 step command  3 3 3   Language- read & follow direction  0 0 0  Write a sentence  0 0 0  Copy design  0 0 0  Total score  20 20 20         10/25/2022    8:21 AM  6CIT Screen  What Year? 0 points  What month? 0 points  What time? 0 points  Count back from 20 0 points  Months in reverse 0 points  Repeat phrase 0 points  Total Score 0 points    Immunizations Immunization  History  Administered Date(s) Administered   Pneumococcal Conjugate-13 10/16/2017   Td 05/08/2004   Zoster, Live 03/23/2009    TDAP status: Due, Education has been provided regarding the importance of this vaccine. Advised may receive this vaccine at local pharmacy or Health Dept. Aware to provide a copy of the vaccination record if obtained from local pharmacy or Health Dept. Verbalized acceptance and understanding.  Flu Vaccine status: Declined, Education has been provided regarding the importance of this vaccine but patient still declined. Advised may receive this vaccine at local pharmacy or Health Dept. Aware to provide a copy of the vaccination record if obtained from local pharmacy or Health Dept. Verbalized acceptance and understanding.  Pneumococcal vaccine status: Declined,  Education has been provided regarding the importance of this vaccine but patient still declined. Advised may receive this vaccine at local pharmacy or Health Dept. Aware to provide a copy of the vaccination record if obtained from local pharmacy or Health Dept. Verbalized acceptance and understanding.   Covid-19 vaccine status: Declined, Education has been provided regarding the importance of this vaccine but patient still declined. Advised may receive this vaccine at local pharmacy or Health Dept.or vaccine clinic. Aware to provide a copy of the vaccination record if obtained from local pharmacy or Health Dept. Verbalized acceptance and understanding.  Qualifies for Shingles Vaccine? Yes   Zostavax completed No   Shingrix Completed?: No.    Education has been provided regarding the importance of this vaccine. Patient has been advised to call insurance company to determine out of pocket expense if they have not yet received this vaccine. Advised may also receive vaccine at local pharmacy or Health Dept. Verbalized acceptance and understanding.  Screening Tests Health Maintenance  Topic Date Due   Zoster Vaccines-  Shingrix (1 of 2) Never done   DTaP/Tdap/Td (2 - Tdap) 05/08/2014   MAMMOGRAM  04/16/2018   Pneumonia Vaccine 37+ Years old (2 of 2 - PPSV23 or PCV20) 10/17/2018   INFLUENZA VACCINE  02/07/2023   Medicare Annual Wellness (AWV)  10/25/2023   Fecal DNA (Cologuard)  09/27/2024   DEXA SCAN  Completed   Hepatitis C Screening  Completed   HPV VACCINES  Aged Out   COVID-19 Vaccine  Discontinued    Health Maintenance  Health Maintenance Due  Topic Date Due   Zoster Vaccines- Shingrix (1 of 2) Never done   DTaP/Tdap/Td (2 -  Tdap) 05/08/2014   MAMMOGRAM  04/16/2018   Pneumonia Vaccine 26+ Years old (2 of 2 - PPSV23 or PCV20) 10/17/2018    Colorectal cancer screening: Type of screening: Cologuard. Completed 10/11/2021. Repeat every 3 years  Mammogram status: Completed 04/16/17. Repeat every year pt scheduled for 01/04/23  Bone Density status: Completed 11/25/2017. Results reflect: Bone density results: OSTEOPENIA. Repeat every 2 years. Ordered today.  Lung Cancer Screening: (Low Dose CT Chest recommended if Age 18-80 years, 30 pack-year currently smoking OR have quit w/in 15years.) does not qualify.   Lung Cancer Screening Referral: no  Additional Screening:  Hepatitis C Screening: does qualify; Completed 10/09/16  Vision Screening: Recommended annual ophthalmology exams for early detection of glaucoma and other disorders of the eye. Is the patient up to date with their annual eye exam?   Has appointment   10/29/22 Who is the provider or what is the name of the office in which the patient attends annual eye exams? [facility] If pt is not established with a provider, would they like to be referred to a provider to establish care? No .   Dental Screening: Recommended annual dental exams for proper oral hygiene  Community Resource Referral / Chronic Care Management: CRR required this visit?  No   CCM required this visit?  No      Plan:     I have personally reviewed and  noted the following in the patient's chart:   Medical and social history Use of alcohol, tobacco or illicit drugs  Current medications and supplements including opioid prescriptions. Patient is not currently taking opioid prescriptions. Functional ability and status Nutritional status Physical activity Advanced directives List of other physicians Hospitalizations, surgeries, and ER visits in previous 12 months Vitals Screenings to include cognitive, depression, and falls Referrals and appointments  In addition, I have reviewed and discussed with patient certain preventive protocols, quality metrics, and best practice recommendations. A written personalized care plan for preventive services as well as general preventive health recommendations were provided to patient.     Maryan Puls, LPN   1/61/0960   Nurse Notes: Vaccinations: declines all Influenza vaccine: recommend every Fall Pneumococcal vaccine: recommend once per lifetime Prevnar-20 Tdap vaccine: recommend every 10 years Shingles vaccine: recommend Shingrix which is 2 doses 2-6 months apart and over 90% effective     Covid-19: recommend 2 doses one month apart with a booster 6 months later

## 2022-10-29 ENCOUNTER — Ambulatory Visit
Admission: RE | Admit: 2022-10-29 | Discharge: 2022-10-29 | Disposition: A | Discharge status: Home / Self Care | Payer: Medicare Other | Source: Ambulatory Visit | Attending: Neurology | Admitting: Neurology

## 2022-10-29 DIAGNOSIS — M4802 Spinal stenosis, cervical region: Secondary | ICD-10-CM

## 2022-11-06 ENCOUNTER — Telehealth: Payer: Self-pay | Admitting: Neurology

## 2022-11-06 NOTE — Telephone Encounter (Signed)
Patient returned call and I have provided her with MRI results and recommendations per Dr. Allena Katz. Patient verbalized understanding.   Patient also stated that for 3 days she has had a horrible headache and has knots around her neck and head. She states she took something for her headache over the counter and she is not sure if it is a reactions. I advised patient that based off of what she has told me with her hard knots, terrible headache, numbness of face that she needs to go to the ER or Urgent Care to be evaluated right away. I informed patient that Dr. Allena Katz is out of the office for the rest of the week. Patient verbalized understanding and will go get evaluated at the Urgent Care or ER right away.

## 2022-11-06 NOTE — Telephone Encounter (Signed)
Called patient and left a message for a call back.  

## 2022-11-06 NOTE — Telephone Encounter (Signed)
New message   The patient has couple of questions .    MRI test results   2. C/o sharp pain neck  swelling  hard knot & numbness on left sided of face x 3 days .   No emergency room visit while away on vacation.

## 2022-11-07 ENCOUNTER — Encounter: Payer: Self-pay | Admitting: Family Medicine

## 2022-11-07 ENCOUNTER — Ambulatory Visit (INDEPENDENT_AMBULATORY_CARE_PROVIDER_SITE_OTHER): Payer: Medicare Other | Admitting: Family Medicine

## 2022-11-07 VITALS — BP 128/72 | HR 85 | Temp 97.6°F | Ht 59.0 in | Wt 115.5 lb

## 2022-11-07 DIAGNOSIS — B029 Zoster without complications: Secondary | ICD-10-CM | POA: Insufficient documentation

## 2022-11-07 DIAGNOSIS — G43709 Chronic migraine without aura, not intractable, without status migrainosus: Secondary | ICD-10-CM | POA: Diagnosis not present

## 2022-11-07 MED ORDER — TRAMADOL HCL 50 MG PO TABS: 50.0000 mg | ORAL_TABLET | Freq: Three times a day (TID) | ORAL | 0 refills | Status: DC | PRN

## 2022-11-07 MED ORDER — VALACYCLOVIR HCL 1 G PO TABS
1000.0000 mg | ORAL_TABLET | Freq: Three times a day (TID) | ORAL | 0 refills | Status: DC
Start: 2022-11-07 — End: 2023-04-02

## 2022-11-07 NOTE — Assessment & Plan Note (Addendum)
C3-4 dermatomes on the L side - started when out of town and rash continues to enlarge Causing ear and throat pain as well as neck, head and skin  Baseline headache is worse  No dizziness No s/s of bacterial infection today  Discussed opt for treatment  Will start valtrex 1 g tid for 7 d (explained expectations for this) Tramadol 50 mg tid prn pain with caution of sedation, dizziness, constipation and habit  Instructed not to mix with trazodone Instructed to call if worse / if pain is not controlled  ER precautions noted  Has f/u with neurology mid month for migraine  Consider gabapentin or similar if needed for pain or post herpetic neuralgia Handout given

## 2022-11-07 NOTE — Progress Notes (Signed)
Subjective:    Patient ID: Joann Buchanan, female    DOB: 01-May-1946, 77 y.o.   MRN: [id number]  HPI Pt presents with rash on neck  Wt Readings from Last 3 Encounters:  11/07/22 115 lb 8 oz (52.4 kg)  10/25/22 117 lb 9.6 oz (53.3 kg)  09/17/22 117 lb (53.1 kg)   23.33 kg/m  Vitals:   11/07/22 1157  BP: 128/72  Pulse: 85  Temp: 97.6 F (36.4 C)  SpO2: 97%    Broken out with rash on neck  Knots came out in hairline on L  Now rash on neck and face    Seeing Dr Allena Katz for headaches Has a headache every day  Topamax 25 mg   Patient Active Problem List   Diagnosis Date Noted   Encounter for screening mammogram for breast cancer 10/30/2016    Priority: Low   Shingles 11/07/2022   Neck pain 08/22/2022   History of TIA (transient ischemic attack) 02/20/2022   Insomnia 02/20/2022   Low serum vitamin B12 09/05/2021   Hyperlipidemia, mild 08/30/2021   Palpitations 08/30/2021   Chest discomfort 08/27/2021   Adverse effect of proton pump inhibitor 08/25/2021   Dysphagia 08/25/2021   Strain of forearm, right 12/14/2020   Grief reaction 11/30/2020   Prediabetes 05/09/2018   S/P cervical spinal fusion 12/02/2015   Osteopenia 10/25/2015   Estrogen deficiency 10/05/2015   Routine general medical examination at a health care facility 09/25/2015   Hand tingling 04/05/2015   Encounter for Medicare annual wellness exam 03/03/2013   OSTEOARTHRITIS, HANDS, 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    HTN (hypertension)    Hx of fibrocystic disease of breast    Melanoma (HCC)    Migraine    Numbness and tingling    bilateral hands   Osteoarthritis of hand    PONV  (postoperative nausea and vomiting)    Past Surgical History:  Procedure Laterality Date   ANTERIOR CERVICAL DECOMP/DISCECTOMY FUSION N/A 12/02/2015   Procedure: ANTERIOR CERVICAL DECOMPRESSION/DISCECTOMY FUSION CERVICAL FOUR-FIVE;  Surgeon: Tia Alert, MD;  Location: [facility];  Service: Neurosurgery;  Laterality: N/A;   APPENDECTOMY     BREAST BIOPSY     normal   CATARACT EXTRACTION Right Jan 2017   CATARACT EXTRACTION W/ INTRAOCULAR LENS IMPLANT Left 2018   DEXA  7/07   negative   EMG     left upper arm- neg   ESOPHAGOGASTRODUODENOSCOPY     normal-24 hour PH probe (-)   EYE SURGERY  10/09   Left eye; for double vision   PARTIAL HYSTERECTOMY     Ovaries intact   TUBAL LIGATION     Social History   Tobacco Use   Smoking status: Never   Smokeless tobacco: Never  Substance Use Topics   Alcohol use: No    Alcohol/week: 0.0 standard drinks of alcohol   Drug use: No   Family History  Problem Relation Age of Onset   Breast cancer Mother        Died, 58   Other Father        Died, 64 - tree fell on him   Breast  cancer Sister        Died, 42   Heart disease Sister        Died, 38   Healthy Son    Healthy Daughter    Allergies  Allergen Reactions   Sulfonamide Derivatives Anaphylaxis and Other (See Comments)    REACTION: unspecified Hives and swelling   Current Outpatient Medications on File Prior to Visit  Medication Sig Dispense Refill   ALPRAZolam (XANAX) 0.25 MG tablet Take 0.25 mg by mouth daily as needed (for airplane flights).     fluticasone (FLONASE) 50 MCG/ACT nasal spray Place into both nostrils daily. As needed     methocarbamol (ROBAXIN) 500 MG tablet Take 1 tablet (500 mg total) by mouth every 8 (eight) hours as needed for muscle spasms (headache, neck pain). Caution of sedation 30 tablet 2   Polyethyl Glycol-Propyl Glycol 0.4-0.3 % SOLN Place 1 drop into both eyes as needed.     topiramate (TOPAMAX) 25 MG tablet Take 1 tablet tablet daily x 1  month, then increase to 1 tablet twice daily. 60 tablet 5   traZODone (DESYREL) 50 MG tablet TAKE 1-2 TABLETS (50-100 MG TOTAL) BY MOUTH AT BEDTIME AS NEEDED. FOR SLEEP 60 tablet 11   No current facility-administered medications on file prior to visit.     Review of Systems  Constitutional:  Negative for activity change, appetite change, fatigue, fever and unexpected weight change.  HENT:  Positive for ear pain and sore throat. Negative for congestion, ear discharge, rhinorrhea and sinus pressure.   Eyes:  Negative for pain, redness and visual disturbance.  Respiratory:  Negative for cough, shortness of breath and wheezing.   Cardiovascular:  Negative for chest pain and palpitations.  Gastrointestinal:  Negative for abdominal pain, blood in stool, constipation and diarrhea.  Endocrine: Negative for polydipsia and polyuria.  Genitourinary:  Negative for dysuria, frequency and urgency.  Musculoskeletal:  Negative for arthralgias, back pain and myalgias.  Skin:  Negative for pallor and rash.  Allergic/Immunologic: Negative for environmental allergies.  Neurological:  Positive for headaches. Negative for dizziness, tremors, syncope, facial asymmetry, weakness and numbness.       Tingline over shingles rash area as well as burning pain and aching Worse headaches   Hematological:  Negative for adenopathy. Does not bruise/bleed easily.  Psychiatric/Behavioral:  Negative for decreased concentration and dysphoric mood. The patient is nervous/anxious.        Objective:   Physical Exam Constitutional:      General: She is not in acute distress.    Appearance: Normal appearance. She is normal weight. She is not ill-appearing.  HENT:     Right Ear: Tympanic membrane and ear canal normal.     Left Ear: Tympanic membrane and ear canal normal.     Nose: Nose normal.     Mouth/Throat:     Mouth: Mucous membranes are moist.     Pharynx: No oropharyngeal exudate or posterior oropharyngeal erythema.   Eyes:     General: No scleral icterus.       Right eye: No discharge.        Left eye: No discharge.     Conjunctiva/sclera: Conjunctivae normal.     Pupils: Pupils are equal, round, and reactive to light.  Neck:     Vascular: No carotid bruit.     Comments: Few L cervical LN in shingles area  Cardiovascular:     Rate and Rhythm: Normal rate and regular rhythm.  Pulmonary:  Effort: Pulmonary effort is normal. No respiratory distress.     Breath sounds: Normal breath sounds. No wheezing.  Musculoskeletal:     Cervical back: Tenderness present.  Lymphadenopathy:     Cervical: Cervical adenopathy present.  Skin:    General: Skin is warm and dry.     Findings: Erythema and rash present.     Comments: Patchy erythematous vesicular rash noted over C3 and C4 dermatomes on L scalp and neck  Rash stops at midline No s/s of bacterial infection  Rash extends to mandible and pre auricular area None on nose or near eye   No drainage Some tenderness     Neurological:     General: No focal deficit present.     Mental Status: She is alert.     Cranial Nerves: No cranial nerve deficit.     Sensory: No sensory deficit.     Motor: No weakness.  Psychiatric:        Mood and Affect: Mood is anxious.           Assessment & Plan:   Problem List Items Addressed This Visit       Cardiovascular and Mediastinum   Migraine    Reviewed recent neurology note from Dr Allena Katz as well as imaging/ MRI  Now on topamax 25 mg bid and methocarbamol prn  Cervical spine issues are a trigger for her  Currently has shingles on head and neck so this is worsening headaches and confusing the picture No stroke symptoms  Will f/u with neuro mid month as planned Er precautions noted       Relevant Medications   traMADol (ULTRAM) 50 MG tablet     Nervous and Auditory   Shingles - Primary    C3-4 dermatomes on the L side - started when out of town and rash continues to enlarge Causing ear and  throat pain as well as neck, head and skin  Baseline headache is worse  No dizziness No s/s of bacterial infection today  Discussed opt for treatment  Will start valtrex 1 g tid for 7 d (explained expectations for this) Tramadol 50 mg tid prn pain with caution of sedation, dizziness, constipation and habit  Instructed not to mix with trazodone Instructed to call if worse / if pain is not controlled  ER precautions noted  Has f/u with neurology mid month for migraine  Consider gabapentin or similar if needed for pain or post herpetic neuralgia Handout given      Relevant Medications   valACYclovir (VALTREX) 1000 MG tablet

## 2022-11-07 NOTE — Assessment & Plan Note (Signed)
Reviewed recent neurology note from Dr Allena Katz as well as imaging/ MRI  Now on topamax 25 mg bid and methocarbamol prn  Cervical spine issues are a trigger for her  Currently has shingles on head and neck so this is worsening headaches and confusing the picture No stroke symptoms  Will f/u with neuro mid month as planned Er precautions noted

## 2022-11-07 NOTE — Patient Instructions (Signed)
You have shingles  Take the valcyclovir 1 g three times daily for 7 days   Try the tramadol for pain - as needed  If may cause sedation/dizziness- be careful Use a stool softener if it makes you constipated   Try holding the trazodone if you take tramadol   Keep rash clean with soap and water  A gentle cool compress can help    Follow up with neurology as planned   Update [place] if symptoms worsen

## 2022-11-08 ENCOUNTER — Other Ambulatory Visit: Payer: Medicare Other

## 2022-11-09 ENCOUNTER — Telehealth: Payer: Self-pay | Admitting: Family Medicine

## 2022-11-09 NOTE — Telephone Encounter (Signed)
Pt called in wants to know if she can double up on Rx traMADol (ULTRAM) 50 MG table  stated she is in a lot of pain . Please advise [phone]

## 2022-11-09 NOTE — Telephone Encounter (Signed)
Yes, she can take 2 pills insteadof 1 if needed, but be careful of sedation and fall risk as we discussed Thanks for letting me know and I hope that helps Keep [place] posted I am out of town and will be checking the computer when I can

## 2022-11-09 NOTE — Telephone Encounter (Signed)
Pt notified of Dr. Royden Purl comments and warnings and verbalized understanding. She will keep [place] posted.

## 2022-11-12 ENCOUNTER — Telehealth: Payer: Self-pay | Admitting: Neurology

## 2022-11-12 NOTE — Telephone Encounter (Signed)
Pt called in stating she would like to talk to someone about her MRI results. She also has shingles and her PCP told her she needed to speak with Dr. Allena Katz about it.

## 2022-11-12 NOTE — Telephone Encounter (Signed)
PCP notes reviewed where she was diagnosed with shingles and started on medication.  What specific question does she have?    She is scheduled for follow-up with me next week, I am happy to review her imaging and address questions at that time.  See result note for imaging findings -  there has been some progressive changes on MRI, which can contribute to headaches and neck pain.  Nothing which is severe. Recommend neck PT.

## 2022-11-13 ENCOUNTER — Other Ambulatory Visit: Payer: Self-pay | Admitting: Family Medicine

## 2022-11-13 ENCOUNTER — Telehealth: Payer: Self-pay | Admitting: Family Medicine

## 2022-11-13 NOTE — Telephone Encounter (Signed)
Last filled on 11/07/22 #21 tabs 0 refills, med was given on 11/07/22 at appt for shingles

## 2022-11-13 NOTE — Telephone Encounter (Signed)
We don't refill that -it is a one time course How is she doing?

## 2022-11-13 NOTE — Telephone Encounter (Signed)
Prescription Request  11/13/2022  LOV: 11/07/2022  What is the name of the medication or equipment? valACYclovir (VALTREX) 1000 MG tablet   Have you contacted your pharmacy to request a refill? Yes   Which pharmacy would you like this sent to?  [id number], [place] - [address] [place] [address] Phone: [phone] Fax: [phone]    Patient notified that their request is being sent to the clinical staff for review and that they should receive a response within 2 business days.   Please advise at Mobile [phone] (mobile)

## 2022-11-13 NOTE — Telephone Encounter (Signed)
Called and spoke to patient and when she answered the phone I could tell she was crying on the phone. I asked patient if she was ok and she said "no, I called yesterday and noone called me back and I am in so much pain!" I informed patient that I was sorry and that I was busy in clinic and that we have 24 hours to return calls. I asked patient what is going on and she stated shinges, terrible headache and sore all over is what I could make out of what she was saying in between her tears.   I asked patient if she contacted Dr. Milinda Antis who is managing her pain for shingles and she stated no. I asked that she contact Dr Milinda Antis to let her know she is in pain and if it were that bad to please seek care and examination at an urgent care or ER.  Patient stated she just wants to see Dr. Allena Katz. I informed patient on Dr. Eliane Decree recommendations per below. Patient stated she does not want to wait until 11/21/23 to see Dr. Allena Katz. I informed her that I can place her on a wait list and if anything pops up sooner she will be contacted. Advised patient that I will inform Dr. Allena Katz of above and place her on a wait list. Patient verbalized understanding.

## 2022-11-13 NOTE — Telephone Encounter (Signed)
See prev note.  Name of Medication: Tramadol Name of Pharmacy: [facility] Last Fill or Written Date and Quantity: 11/07/22 #15 tabs/ 0 refill Last Office Visit and Type: Shingles 11/07/22 Next Office Visit and Type: none scheduled

## 2022-11-13 NOTE — Telephone Encounter (Signed)
Joann Buchanan called in and stated that she is in a lot of pain. She was wanting to know if Dr. Milinda Antis was going to refill this medication. She stated that she had no refills but she was told to call in if she need more. Thank you!

## 2022-11-13 NOTE — Telephone Encounter (Signed)
See refill request for tramadol. Pt gave an update on how she feels

## 2022-11-13 NOTE — Telephone Encounter (Signed)
Pt notified and f/u appt scheduled  

## 2022-11-13 NOTE — Telephone Encounter (Signed)
I refilled it   Please schedule f/u with me if needed   I don't think her neuro appt is until mid month Thanks

## 2022-11-14 ENCOUNTER — Ambulatory Visit (INDEPENDENT_AMBULATORY_CARE_PROVIDER_SITE_OTHER): Payer: Medicare Other | Admitting: Family Medicine

## 2022-11-14 ENCOUNTER — Encounter: Payer: Self-pay | Admitting: Family Medicine

## 2022-11-14 VITALS — BP 128/80 | HR 92 | Temp 97.8°F | Ht 59.0 in | Wt 113.5 lb

## 2022-11-14 DIAGNOSIS — G43709 Chronic migraine without aura, not intractable, without status migrainosus: Secondary | ICD-10-CM

## 2022-11-14 DIAGNOSIS — B029 Zoster without complications: Secondary | ICD-10-CM | POA: Diagnosis not present

## 2022-11-14 MED ORDER — GABAPENTIN 100 MG PO CAPS: 100.0000 mg | ORAL_CAPSULE | Freq: Three times a day (TID) | ORAL | 1 refills | Status: DC

## 2022-11-14 NOTE — Assessment & Plan Note (Signed)
L C3-4 dermatomes - symptomatically worse (pain) but rash improved (drying up) Using tramadol 50-100 mg up to tid for severe pain and tolerates it well but still very uncomfortable Discussed option of gabapentin (discussed poss side effects and also poss interaction with trazodone) Will start 100 mg bid  If well tolerated for 2 days can go up to tid  Then if doing well we can slowly titrate up based on response and side effect profile Will watch for sedation/ dizziness/ balance change or mental cloudiness or mood change  Also discussed s/s of serotonin syndrome  Instructed to update [place] in a week or earlier if needed  Finish valtrex (almost done) Keep rash clean and dry  Follow up with neuro as planned for headaches (which in retrospect may be related to this but not entirely sure)

## 2022-11-14 NOTE — Assessment & Plan Note (Signed)
Unsure if early shingles worsened her headaches in retrospect  Will f/u with neuro on 5/13 as planned

## 2022-11-14 NOTE — Progress Notes (Signed)
Subjective:    Patient ID: Joann Buchanan, female    DOB: Jul 19, 1945, 77 y.o.   MRN: [id number]  HPI Pt presents for f/u of shingles   Wt Readings from Last 3 Encounters:  11/14/22 113 lb 8 oz (51.5 kg)  11/07/22 115 lb 8 oz (52.4 kg)  10/25/22 117 lb 9.6 oz (53.3 kg)   22.92 kg/m  Vitals:   11/14/22 0958  BP: 128/80  Pulse: 92  Temp: 97.8 F (36.6 C)  SpO2: 98%   The rash is still not bad  Around L ear and around neck -thinks LN are swollen   Pain is bad   Taking tramadol - helps some  Tolerating it fine   (not sedating or causing dizziness)  Taking 2 at a time helps more than 1   three times daily  Taking motrin also    Has neuro appt 5/13 for headaches  Patient Active Problem List   Diagnosis Date Noted   Encounter for screening mammogram for breast cancer 10/30/2016    Priority: Low   Shingles 11/07/2022   Neck pain 08/22/2022   History of TIA (transient ischemic attack) 02/20/2022   Insomnia 02/20/2022   Low serum vitamin B12 09/05/2021   Hyperlipidemia, mild 08/30/2021   Palpitations 08/30/2021   Chest discomfort 08/27/2021   Adverse effect of proton pump inhibitor 08/25/2021   Dysphagia 08/25/2021   Strain of forearm, right 12/14/2020   Grief reaction 11/30/2020   Prediabetes 05/09/2018   S/P cervical spinal fusion 12/02/2015   Osteopenia 10/25/2015   Estrogen deficiency 10/05/2015   Routine general medical examination at a health care facility 09/25/2015   Hand tingling 04/05/2015   Encounter for Medicare annual wellness exam 03/03/2013   OSTEOARTHRITIS, HANDS, 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    HTN (hypertension)    Hx of fibrocystic  disease of breast    Melanoma (HCC)    Migraine    Numbness and tingling    bilateral hands   Osteoarthritis of hand    PONV (postoperative nausea and vomiting)    Past Surgical History:  Procedure Laterality Date   ANTERIOR CERVICAL DECOMP/DISCECTOMY FUSION N/A 12/02/2015   Procedure: ANTERIOR CERVICAL DECOMPRESSION/DISCECTOMY FUSION CERVICAL FOUR-FIVE;  Surgeon: Tia Alert, MD;  Location: [facility];  Service: Neurosurgery;  Laterality: N/A;   APPENDECTOMY     BREAST BIOPSY     normal   CATARACT EXTRACTION Right Jan 2017   CATARACT EXTRACTION W/ INTRAOCULAR LENS IMPLANT Left 2018   DEXA  7/07   negative   EMG     left upper arm- neg   ESOPHAGOGASTRODUODENOSCOPY     normal-24 hour PH probe (-)   EYE SURGERY  10/09   Left eye; for double vision   PARTIAL HYSTERECTOMY     Ovaries intact   TUBAL LIGATION     Social History   Tobacco Use   Smoking status: Never   Smokeless tobacco: Never  Substance Use Topics   Alcohol use: No    Alcohol/week: 0.0 standard drinks of alcohol   Drug use: No   Family History  Problem Relation Age of Onset   Breast cancer  Mother        Died, 30   Other Father        Died, 59 - tree fell on him   Breast cancer Sister        Died, 32   Heart disease Sister        Died, 49   Healthy Son    Healthy Daughter    Allergies  Allergen Reactions   Sulfonamide Derivatives Anaphylaxis and Other (See Comments)    REACTION: unspecified Hives and swelling   Current Outpatient Medications on File Prior to Visit  Medication Sig Dispense Refill   ALPRAZolam (XANAX) 0.25 MG tablet Take 0.25 mg by mouth daily as needed (for airplane flights).     fluticasone (FLONASE) 50 MCG/ACT nasal spray Place into both nostrils daily. As needed     methocarbamol (ROBAXIN) 500 MG tablet Take 1 tablet (500 mg total) by mouth every 8 (eight) hours as needed for muscle spasms (headache, neck pain). Caution of sedation 30 tablet 2   Polyethyl Glycol-Propyl  Glycol 0.4-0.3 % SOLN Place 1 drop into both eyes as needed.     topiramate (TOPAMAX) 25 MG tablet Take 1 tablet tablet daily x 1 month, then increase to 1 tablet twice daily. 60 tablet 5   traMADol (ULTRAM) 50 MG tablet Take 1 tablet (50 mg total) by mouth 3 (three) times daily as needed for up to 5 days. 15 tablet 0   traZODone (DESYREL) 50 MG tablet TAKE 1-2 TABLETS (50-100 MG TOTAL) BY MOUTH AT BEDTIME AS NEEDED. FOR SLEEP 60 tablet 11   valACYclovir (VALTREX) 1000 MG tablet Take 1 tablet (1,000 mg total) by mouth 3 (three) times daily. 21 tablet 0   No current facility-administered medications on file prior to visit.    Review of Systems  Constitutional:  Negative for activity change, appetite change, fatigue, fever and unexpected weight change.  HENT:  Negative for congestion, ear pain, rhinorrhea, sinus pressure and sore throat.   Eyes:  Negative for photophobia, pain, redness and visual disturbance.  Respiratory:  Negative for cough, shortness of breath and wheezing.   Cardiovascular:  Negative for chest pain and palpitations.  Gastrointestinal:  Negative for abdominal pain, blood in stool, constipation and diarrhea.  Endocrine: Negative for polydipsia and polyuria.  Genitourinary:  Negative for dysuria, frequency and urgency.  Musculoskeletal:  Negative for arthralgias, back pain and myalgias.  Skin:  Positive for rash. Negative for pallor.       Very painful rash  Head/ear area and neck pain    Allergic/Immunologic: Negative for environmental allergies.  Neurological:  Negative for dizziness, syncope and headaches.  Hematological:  Negative for adenopathy. Does not bruise/bleed easily.  Psychiatric/Behavioral:  Negative for decreased concentration and dysphoric mood. The patient is not nervous/anxious.        Objective:   Physical Exam Constitutional:      General: She is not in acute distress.    Appearance: Normal appearance. She is well-developed and normal weight. She  is not ill-appearing or diaphoretic.  HENT:     Head: Normocephalic and atraumatic.  Eyes:     Conjunctiva/sclera: Conjunctivae normal.     Pupils: Pupils are equal, round, and reactive to light.  Neck:     Thyroid: No thyromegaly.     Vascular: No carotid bruit or JVD.  Cardiovascular:     Rate and Rhythm: Normal rate and regular rhythm.     Heart sounds: Normal heart sounds.     No  gallop.  Pulmonary:     Effort: Pulmonary effort is normal. No respiratory distress.     Breath sounds: Normal breath sounds. No wheezing or rales.  Abdominal:     General: There is no distension or abdominal bruit.     Palpations: Abdomen is soft.  Musculoskeletal:     Cervical back: Normal range of motion and neck supple.     Right lower leg: No edema.     Left lower leg: No edema.  Lymphadenopathy:     Cervical: No cervical adenopathy.  Skin:    General: Skin is warm and dry.     Coloration: Skin is not pale.     Findings: No rash.     Comments: Vesicular rash on L post scalp/over and around ear and L neck is patchy with some dryness and scabbing  Sensitive to touch No drainage or sign of bacterial infection   Neurological:     Mental Status: She is alert.     Cranial Nerves: No cranial nerve deficit.     Sensory: No sensory deficit.     Coordination: Coordination normal.     Deep Tendon Reflexes: Reflexes are normal and symmetric. Reflexes normal.  Psychiatric:        Mood and Affect: Mood normal.           Assessment & Plan:   Problem List Items Addressed This Visit       Cardiovascular and Mediastinum   Migraine    Unsure if early shingles worsened her headaches in retrospect  Will f/u with neuro on 5/13 as planned       Relevant Medications   gabapentin (NEURONTIN) 100 MG capsule     Nervous and Auditory   Shingles - Primary    L C3-4 dermatomes - symptomatically worse (pain) but rash improved (drying up) Using tramadol 50-100 mg up to tid for severe pain and  tolerates it well but still very uncomfortable Discussed option of gabapentin (discussed poss side effects and also poss interaction with trazodone) Will start 100 mg bid  If well tolerated for 2 days can go up to tid  Then if doing well we can slowly titrate up based on response and side effect profile Will watch for sedation/ dizziness/ balance change or mental cloudiness or mood change  Also discussed s/s of serotonin syndrome  Instructed to update [place] in a week or earlier if needed  Finish valtrex (almost done) Keep rash clean and dry  Follow up with neuro as planned for headaches (which in retrospect may be related to this but not entirely sure)

## 2022-11-14 NOTE — Patient Instructions (Addendum)
Start gabapentin for shingles pain   Take 1 pill twice daily for 2 days and make sure it does not sedate you too much (be cautious of sedation and dizziness)  If you tolerate it well, go ahead and increase the gabapentin to 100 mg three times daily  Update me after a week with how you feel   If any intolerable side effects stop it and let me know   This medicine can also make tramadol more sedating so use caution with both of them   Continue other medicines  Finish the valtrex   Follow up with neurology as planned

## 2022-11-19 ENCOUNTER — Ambulatory Visit (INDEPENDENT_AMBULATORY_CARE_PROVIDER_SITE_OTHER): Payer: Medicare Other | Admitting: Neurology

## 2022-11-19 ENCOUNTER — Encounter: Payer: Self-pay | Admitting: Neurology

## 2022-11-19 VITALS — BP 128/84 | HR 82 | Ht 59.0 in | Wt 116.0 lb

## 2022-11-19 DIAGNOSIS — M542 Cervicalgia: Secondary | ICD-10-CM | POA: Diagnosis not present

## 2022-11-19 DIAGNOSIS — B0229 Other postherpetic nervous system involvement: Secondary | ICD-10-CM

## 2022-11-19 DIAGNOSIS — G5601 Carpal tunnel syndrome, right upper limb: Secondary | ICD-10-CM

## 2022-11-19 NOTE — Patient Instructions (Addendum)
Increase gabapentin 200mg  at bedtime, continue 100mg  in the morning and afternoon.  If you are tolerating this, the dose can be increased by 100mg  every 3 days.  Continue physical therapy when you are ready  I will see you back in 2 months

## 2022-11-19 NOTE — Progress Notes (Signed)
Follow-up Visit   Date: 11/19/2022    Joann Buchanan MRN: [id number] DOB: 1945/09/06    Joann Buchanan is a 77 y.o. right-handed Caucasian female with  GERD, HTN, cervical myelopathy at C4-5 s/p ACDF returning to the clinic for post herpetic neuralgia and neck pain.  The patient was accompanied to the clinic by daughter.  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 Yetta Barre).  Recent MRI cervical spine was reviewed with patient and daughter which shows adjacent disease at C5-6 and mild multilevel foraminal stenosis at the upper levels.   - Continue neck PT  Post-herpetic neuralgia - Increase gabapentin to 100mg  in the morning and afternoon and 200mg  at bedtime.  OK to titrate by 100mg  every 3 days as needed  Right carpal tunnel syndrome, moderate-to-severe  - Continue wearing wrist brace  - Discussed management option including steroid injection and surgery, if no improvement with conservative therapies  Return to clinic in 2 months  --------------------------------------------- History of present illness: She was previously seen by me in 2014 - 2017 for migraines, occipital neuralgia, and cervical myelopathy.  She underwent C4-5 ACDF in 2017.  Regarding her migraines, these started in her late 22s and stopped in her 84s.  Migraines started intermittently in her 41s and slowly intensified.Pain is usually over the left side of her head or at the base of her neck. Pain is described as sharp, dull/grinding pain.    Headaches occur about 3 times per week, which lasts about 3 days.  There is associated photophobia, phonophobia, nausea, and vomiting. She has visual auras after the headache, described wavy lines and blurry vision and there is occasional tingling over the left side of her face. Headaches can wake her up from sleeping.  She takes ibuprofen daily for the past year.  She takes imitex nasal spray about once per month.  She has some  relief with methocarbamol daily.  She was started on topiramate 25mg  daily, but self-discontinued it. She has tried propranolol and antidepressant medication.  Last month, she was given prednisone course which did not help.  Prior testing in August including MRI brain, CTA head and neck, and CT venogram was negative.    She also complains of right hand tingling.  Symptoms have been ongoing since prior to her neck surgery.   UPDATE 10/12/2022:  She continues to have daily headaches.  She was taking topiramate 25mg  in the morning but felt that it made her dizzy, so now taking it at bedtime. Dizziness has improved.  She has many questions whether she can take trazadone with topiramate, which I told her would be fine.  She briefly stropped trazadone and has been feeling more nervous and anxious.    UPDATE 11/19/2022:  About 2 weeks ago, she reports having shingles over the left side of the face and scalp.  She continues to have shooting pain over the jaw and neck.  She is taking gabapentin 100mg  three times daily (8:30a, 12:30p, bedtime) which has helped. She denies sedation and is tolerating the medication well.  Migraines have significantly improved. She stopped PT after her shingles caused worsening pain.  She is currently not taking topiramate.    Medications:  Current Outpatient Medications on File Prior to Visit  Medication Sig Dispense Refill   ALPRAZolam (XANAX) 0.25 MG tablet Take 0.25 mg by mouth daily as needed (for airplane flights).     fluticasone (FLONASE) 50 MCG/ACT nasal spray Place into  both nostrils daily. As needed     gabapentin (NEURONTIN) 100 MG capsule Take 1 capsule (100 mg total) by mouth 3 (three) times daily. 90 capsule 1   Polyethyl Glycol-Propyl Glycol 0.4-0.3 % SOLN Place 1 drop into both eyes as needed.     methocarbamol (ROBAXIN) 500 MG tablet Take 1 tablet (500 mg total) by mouth every 8 (eight) hours as needed for muscle spasms (headache, neck pain). Caution of sedation  (Patient not taking: Reported on 11/19/2022) 30 tablet 2   topiramate (TOPAMAX) 25 MG tablet Take 1 tablet tablet daily x 1 month, then increase to 1 tablet twice daily. (Patient not taking: Reported on 11/19/2022) 60 tablet 5   traZODone (DESYREL) 50 MG tablet TAKE 1-2 TABLETS (50-100 MG TOTAL) BY MOUTH AT BEDTIME AS NEEDED. FOR SLEEP (Patient not taking: Reported on 11/19/2022) 60 tablet 11   valACYclovir (VALTREX) 1000 MG tablet Take 1 tablet (1,000 mg total) by mouth 3 (three) times daily. (Patient not taking: Reported on 11/19/2022) 21 tablet 0   No current facility-administered medications on file prior to visit.    Allergies:  Allergies  Allergen Reactions   Sulfonamide Derivatives Anaphylaxis and Other (See Comments)    REACTION: unspecified Hives and swelling    Vital Signs:  BP (!) 164/93   Pulse 82   Ht 4\' 11"  (1.499 m)   Wt 116 lb (52.6 kg)   SpO2 97%   BMI 23.43 kg/m    Neurological Exam: MENTAL STATUS including orientation to time, place, person, recent and remote memory, attention span and concentration, language, and fund of knowledge is normal.  Speech is not dysarthric.  CRANIAL NERVES: Pupils equal round and reactive to light.  Normal conjugate, extra-ocular eye movements in all directions of gaze.  No ptosis . Normal facial sensation.  Face is symmetric.       MOTOR:  Motor strength is 5/5 in all extremities.  No atrophy, fasciculations or abnormal movements.  No pronator drift.  Tone is normal.    MSRs:  Reflexes are 3+/4 throughout  SENSORY:  Intact to vibration throughout.  COORDINATION/GAIT:   Gait narrow based and stable.    Data: NCS/EMG of the right arm 10/12/2022: Right median neuropathy at or distal to the wrist, consistent with a clinical diagnosis of carpal tunnel syndrome.  Overall, these findings are moderate-to-severe in degree electrically.   Thank you for allowing me to participate in patient's care.  If I can answer any additional questions,  I would be pleased to do so.    Sincerely,    Delaine Hernandez K. Allena Katz, DO

## 2022-11-21 ENCOUNTER — Ambulatory Visit: Payer: Medicare Other | Admitting: Neurology

## 2022-12-06 ENCOUNTER — Telehealth: Payer: Self-pay | Admitting: Neurology

## 2022-12-06 MED ORDER — GABAPENTIN 100 MG PO CAPS: 200.0000 mg | ORAL_CAPSULE | Freq: Three times a day (TID) | ORAL | 5 refills | Status: DC

## 2022-12-06 NOTE — Telephone Encounter (Signed)
Pt needs a refill on the Gabapentin called in to the [facility] on [address]. She takes 2 pills 3 times a day   She is out

## 2022-12-06 NOTE — Telephone Encounter (Signed)
Rx sent 

## 2022-12-07 NOTE — Telephone Encounter (Signed)
Called patient and informed her the rx has been sent. Patient verbalized understanding.

## 2023-01-04 ENCOUNTER — Ambulatory Visit
Admission: RE | Admit: 2023-01-04 | Discharge: 2023-01-04 | Disposition: A | Discharge status: Home / Self Care | Payer: Medicare Other | Source: Ambulatory Visit | Attending: Family Medicine | Admitting: Family Medicine

## 2023-01-04 DIAGNOSIS — Z1231 Encounter for screening mammogram for malignant neoplasm of breast: Secondary | ICD-10-CM

## 2023-02-05 ENCOUNTER — Ambulatory Visit: Payer: Medicare Other | Admitting: Neurology

## 2023-02-19 ENCOUNTER — Ambulatory Visit (INDEPENDENT_AMBULATORY_CARE_PROVIDER_SITE_OTHER): Payer: Medicare Other | Admitting: Neurology

## 2023-02-19 ENCOUNTER — Encounter: Payer: Self-pay | Admitting: Neurology

## 2023-02-19 VITALS — BP 161/82 | HR 84 | Ht 59.0 in | Wt 125.0 lb

## 2023-02-19 DIAGNOSIS — B0229 Other postherpetic nervous system involvement: Secondary | ICD-10-CM | POA: Diagnosis not present

## 2023-02-19 DIAGNOSIS — M4802 Spinal stenosis, cervical region: Secondary | ICD-10-CM

## 2023-02-19 DIAGNOSIS — G5601 Carpal tunnel syndrome, right upper limb: Secondary | ICD-10-CM | POA: Diagnosis not present

## 2023-02-19 DIAGNOSIS — R200 Anesthesia of skin: Secondary | ICD-10-CM

## 2023-02-19 DIAGNOSIS — R202 Paresthesia of skin: Secondary | ICD-10-CM

## 2023-02-19 NOTE — Progress Notes (Signed)
Follow-up Visit   Date: 02/19/2023    Joann Buchanan MRN: [id number] DOB: 11-01-1945    Joann Buchanan is a 77 y.o. right-handed Caucasian female with  GERD, HTN, cervical myelopathy at C4-5 s/p ACDF returning to the clinic for post herpetic neuralgia and neck pain.  The patient was accompanied to the clinic by daughter.  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 Yetta Barre).  MRI cervical spine shows adjacent disease at C5-6 and mild multilevel foraminal stenosis at the upper levels.  She has completed PT.  Encouraged her to continue home exercises.  Post-herpetic neuralgia - She is taking gabapentin 200mg  three times + extra dose as needed, which controls her pain.   Right carpal tunnel syndrome, moderate-to-severe  - Continue wearing wrist brace  - Discussed management option including steroid injection and surgery, she prefers non-surgical therapies at this time  Return to clinic in 6 months  --------------------------------------------- History of present illness: She was previously seen by me in 2014 - 2017 for migraines, occipital neuralgia, and cervical myelopathy.  She underwent C4-5 ACDF in 2017.  Regarding her migraines, these started in her late 27s and stopped in her 70s.  Migraines started intermittently in her 68s and slowly intensified.Pain is usually over the left side of her head or at the base of her neck. Pain is described as sharp, dull/grinding pain.    Headaches occur about 3 times per week, which lasts about 3 days.  There is associated photophobia, phonophobia, nausea, and vomiting. She has visual auras after the headache, described wavy lines and blurry vision and there is occasional tingling over the left side of her face. Headaches can wake her up from sleeping.  She takes ibuprofen daily for the past year.  She takes imitex nasal spray about once per month.  She has some relief with methocarbamol daily.  She  was started on topiramate 25mg  daily, but self-discontinued it. She has tried propranolol and antidepressant medication.  Last month, she was given prednisone course which did not help.  Prior testing in August including MRI brain, CTA head and neck, and CT venogram was negative.    She also complains of right hand tingling.  Symptoms have been ongoing since prior to her neck surgery.   UPDATE 10/12/2022:  She continues to have daily headaches.  She was taking topiramate 25mg  in the morning but felt that it made her dizzy, so now taking it at bedtime. Dizziness has improved.  She has many questions whether she can take trazadone with topiramate, which I told her would be fine.  She briefly stropped trazadone and has been feeling more nervous and anxious.    UPDATE 11/19/2022:  About 2 weeks ago, she reports having shingles over the left side of the face and scalp.  She continues to have shooting pain over the jaw and neck.  She is taking gabapentin 100mg  three times daily (8:30a, 12:30p, bedtime) which has helped. She denies sedation and is tolerating the medication well.  Migraines have significantly improved. She stopped PT after her shingles caused worsening pain.  She is currently not taking topiramate.   UPDATE 02/19/2023:  She is here for follow-up visit.  Her facial and scalp pain is doing much better gabapentin 200mg  three times daily + extra tablet daily.  She completed neck PT which has helped as well.  She also tells me that her right hand numbness intensified yesterday after she starting doing more  household chores.  She has known moderate-severe right CTS. Since her last visit, she was diagnosed with melanoma of the nose and underwent surgery for this.  She is still healing.   Medications:  Current Outpatient Medications on File Prior to Visit  Medication Sig Dispense Refill   ALPRAZolam (XANAX) 0.25 MG tablet Take 0.25 mg by mouth daily as needed (for airplane flights).     fluticasone  (FLONASE) 50 MCG/ACT nasal spray Place into both nostrils daily. As needed     gabapentin (NEURONTIN) 100 MG capsule Take 2 capsules (200 mg total) by mouth 3 (three) times daily. (Patient taking differently: Take 200 mg by mouth 3 (three) times daily. 3 capsules in he morning, 2 tablets in the afternoon, 2 tablets at bedtime.) 180 capsule 5   methocarbamol (ROBAXIN) 500 MG tablet Take 1 tablet (500 mg total) by mouth every 8 (eight) hours as needed for muscle spasms (headache, neck pain). Caution of sedation 30 tablet 2   Polyethyl Glycol-Propyl Glycol 0.4-0.3 % SOLN Place 1 drop into both eyes as needed.     traZODone (DESYREL) 50 MG tablet TAKE 1-2 TABLETS (50-100 MG TOTAL) BY MOUTH AT BEDTIME AS NEEDED. FOR SLEEP 60 tablet 11   valACYclovir (VALTREX) 1000 MG tablet Take 1 tablet (1,000 mg total) by mouth 3 (three) times daily. 21 tablet 0   No current facility-administered medications on file prior to visit.    Allergies:  Allergies  Allergen Reactions   Sulfonamide Derivatives Anaphylaxis and Other (See Comments)    REACTION: unspecified Hives and swelling    Vital Signs:  BP (!) 161/82   Pulse 84   Ht 4\' 11"  (1.499 m)   Wt 125 lb (56.7 kg)   SpO2 99%   BMI 25.25 kg/m    Neurological Exam: MENTAL STATUS including orientation to time, place, person, recent and remote memory, attention span and concentration, language, and fund of knowledge is normal.  Speech is not dysarthric.  CRANIAL NERVES: Pupils equal round and reactive to light.  Normal conjugate, extra-ocular eye movements in all directions of gaze.  No ptosis . Normal facial sensation.  Face is symmetric.       MOTOR:  Motor strength is 5/5 in all extremities.  No atrophy, fasciculations or abnormal movements.  No pronator drift.  Tone is normal.    MSRs:  Reflexes are 3+/4 throughout  SENSORY:  Intact to vibration throughout.  COORDINATION/GAIT:   Gait narrow based and stable.    Data: NCS/EMG of the right arm  10/12/2022: Right median neuropathy at or distal to the wrist, consistent with a clinical diagnosis of carpal tunnel syndrome.  Overall, these findings are moderate-to-severe in degree electrically.   Thank you for allowing me to participate in patient's care.  If I can answer any additional questions, I would be pleased to do so.    Sincerely,    Colleena Kurtenbach K. Allena Katz, DO

## 2023-03-06 ENCOUNTER — Ambulatory Visit (INDEPENDENT_AMBULATORY_CARE_PROVIDER_SITE_OTHER): Payer: Medicare Other | Admitting: Family Medicine

## 2023-03-06 ENCOUNTER — Encounter: Payer: Self-pay | Admitting: Family Medicine

## 2023-03-06 VITALS — BP 164/90 | HR 85 | Temp 99.0°F | Ht 59.0 in | Wt 127.0 lb

## 2023-03-06 DIAGNOSIS — J069 Acute upper respiratory infection, unspecified: Secondary | ICD-10-CM

## 2023-03-06 MED ORDER — BENZONATATE 200 MG PO CAPS: 200.0000 mg | ORAL_CAPSULE | Freq: Three times a day (TID) | ORAL | 1 refills | Status: DC | PRN

## 2023-03-06 MED ORDER — GUAIFENESIN-CODEINE 100-10 MG/5ML PO SYRP: 5.0000 mL | ORAL_SOLUTION | Freq: Every evening | ORAL | 0 refills | Status: DC | PRN

## 2023-03-06 NOTE — Progress Notes (Unsigned)
    [name] [name], MD, CAQ Sports Medicine [facility] at [facility] [address] [place], [address]  Phone: [phone]  FAX: [phone]  Joann Buchanan - 77 y.o. female  MRN [id number]  Date of Birth: Mar 15, 1946  Date: 03/06/2023  PCP: Judy Pimple, MD  Referral: Judy Pimple, MD  Chief Complaint  Patient presents with   Cough    Started on Friday Negative Covid test on Sunday   Nasal Congestion        Ear Pain   Subjective:   Joann Buchanan is a 77 y.o. very pleasant female patient with Body mass index is 25.65 kg/m. who presents with the following:  Has a cough and it is pretty bad.  A little bit of a runny nose.  Friday with an earache Some tenderness in the back of her throat.   No fever, chills, body aches. A little bit of pain above her eyes  Recently had a melanoma No lung disease  Does have some intermittent sob  Great grandchild was sick  Review of Systems is noted in the HPI, as appropriate  Objective:   BP (!) 164/90 (BP Location: Left Arm, Patient Position: Sitting, Cuff Size: Normal)   Pulse 85   Temp 99 F (37.2 C) (Temporal)   Ht 4\' 11"  (1.499 m)   Wt 127 lb (57.6 kg)   SpO2 98%   BMI 25.65 kg/m   GEN: No acute distress; alert,appropriate. PULM: Breathing comfortably in no respiratory distress PSYCH: Normally interactive.   Laboratory and Imaging Data:  Assessment and Plan:   ***

## 2023-03-07 ENCOUNTER — Encounter: Payer: Self-pay | Admitting: Family Medicine

## 2023-03-29 ENCOUNTER — Telehealth: Payer: Self-pay | Admitting: Family Medicine

## 2023-03-29 NOTE — Telephone Encounter (Signed)
Pt think she is experience shinglse agaid and she do not know what to do . Should she take a certain medication to help it. Please call her

## 2023-03-29 NOTE — Telephone Encounter (Signed)
Will see her then Agree with ER precautions

## 2023-03-29 NOTE — Telephone Encounter (Signed)
Please triage

## 2023-03-29 NOTE — Telephone Encounter (Signed)
I spoke with pt; on  03/28/23 pt started with pain in spine that goes to lt ear;pt said same pain had when had shingles the first of this year. Pt took 3 gabapentin 100 mg and the throbbing pain is little better. Pt has also taken ibuprofen. Pt said no rash. Pt wonders if could be shingles again. Offered pt an appt but pt said she is on her way somewhere and can not go today; can something be called in. Explain need to be evaluated. Pt only wants  to see Dr Milinda Antis. Pt scheduled appt with Dr Milinda Antis on 04/02/23 at 12 noon with UC & ED precautions and pt voiced understanding. Sending note to Dr Milinda Antis  and [employer].Marland Kitchen

## 2023-04-01 ENCOUNTER — Other Ambulatory Visit: Payer: Self-pay | Admitting: Family Medicine

## 2023-04-02 ENCOUNTER — Ambulatory Visit (INDEPENDENT_AMBULATORY_CARE_PROVIDER_SITE_OTHER): Payer: Medicare Other | Admitting: Family Medicine

## 2023-04-02 ENCOUNTER — Encounter: Payer: Self-pay | Admitting: Family Medicine

## 2023-04-02 VITALS — BP 148/88 | HR 76 | Temp 97.6°F | Ht 59.0 in | Wt 125.1 lb

## 2023-04-02 DIAGNOSIS — I10 Essential (primary) hypertension: Secondary | ICD-10-CM

## 2023-04-02 DIAGNOSIS — F430 Acute stress reaction: Secondary | ICD-10-CM | POA: Diagnosis not present

## 2023-04-02 DIAGNOSIS — B0229 Other postherpetic nervous system involvement: Secondary | ICD-10-CM | POA: Insufficient documentation

## 2023-04-02 MED ORDER — GABAPENTIN 100 MG PO CAPS: 100.0000 mg | ORAL_CAPSULE | Freq: Every day | ORAL | 1 refills | Status: AC

## 2023-04-02 MED ORDER — GABAPENTIN 300 MG PO CAPS: 300.0000 mg | ORAL_CAPSULE | Freq: Two times a day (BID) | ORAL | 1 refills | Status: DC

## 2023-04-02 NOTE — Patient Instructions (Addendum)
Go up on gabapentin to 300 mg am and pm  You can try adding 100 mg at mid day if needed  Then let me know in the next week if not improving   If any rash -let me know   Cool compress may help    Update if not starting to improve in a week or if worsening    Take care of yourself   Check some blood pressure a home when you are relaxed  If they stay over 140 top or 90 bottom - come back for a visit

## 2023-04-02 NOTE — Progress Notes (Signed)
Subjective:    Patient ID: Joann Buchanan, female    DOB: 1946-07-01, 77 y.o.   MRN: [id number]  HPI  Wt Readings from Last 3 Encounters:  04/02/23 125 lb 2 oz (56.8 kg)  03/06/23 127 lb (57.6 kg)  02/19/23 125 lb (56.7 kg)   25.27 kg/m  Vitals:   04/02/23 1201 04/02/23 1227  BP: (!) 156/90 (!) 148/88  Pulse: 76   Temp: 97.6 F (36.4 C)   SpO2: 99%     Pt c/o ear pain that rad to neck Similar to when she had shingles before    Had shingles in May  Was left C3-4 dermatomal distribution  This made her migraine worse also  Started to have more sharp/prickly pain around left ear  Tender spots but no rash  Outside the ear  Goes to the left side of her neck   Had a headache last week but no migraine   For post herpetic neuralgia  Gabapentin 300 mg in am and 200 in evening   No steroids lately   Stress Has wound on nose that will not heal  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 and causing a lot of problems /making her stressed      Patient Active Problem List   Diagnosis Date Noted   Encounter for screening mammogram for breast cancer 10/30/2016    Priority: Low   Post herpetic neuralgia 04/02/2023   Stress reaction 04/02/2023   Shingles 11/07/2022   History of TIA (transient ischemic attack) 02/20/2022   Insomnia 02/20/2022   Low serum vitamin B12 09/05/2021   Hyperlipidemia, mild 08/30/2021   Palpitations 08/30/2021   Chest discomfort 08/27/2021   Adverse effect of proton pump inhibitor 08/25/2021   Dysphagia 08/25/2021   Grief reaction 11/30/2020   Prediabetes 05/09/2018   S/P cervical spinal fusion 12/02/2015   Osteopenia 10/25/2015   Estrogen deficiency 10/05/2015   Routine general medical examination at a health care facility 09/25/2015   Hand tingling 04/05/2015   Encounter for Medicare annual wellness exam 03/03/2013   OSTEOARTHRITIS, HANDS, BILATERAL 11/01/2009   Menopausal symptoms 03/23/2009   History of melanoma 02/04/2007   THYROMEGALY  02/04/2007   Migraine 02/04/2007   Essential hypertension 02/04/2007   Allergic rhinitis 02/04/2007   GERD 02/04/2007   Past Medical History:  Diagnosis Date   Allergic rhinitis    GERD (gastroesophageal reflux disease)    Goiter    ?    HTN (hypertension)    Hx of fibrocystic disease of breast    Melanoma (HCC)    Migraine    Numbness and tingling    bilateral hands   Osteoarthritis of hand    PONV (postoperative nausea and vomiting)    Past Surgical History:  Procedure Laterality Date   ANTERIOR CERVICAL DECOMP/DISCECTOMY FUSION N/A 12/02/2015   Procedure: ANTERIOR CERVICAL DECOMPRESSION/DISCECTOMY FUSION CERVICAL FOUR-FIVE;  Surgeon: Tia Alert, MD;  Location: [facility];  Service: Neurosurgery;  Laterality: N/A;   APPENDECTOMY     BREAST BIOPSY     normal   CATARACT EXTRACTION Right Jan 2017   CATARACT EXTRACTION W/ INTRAOCULAR LENS IMPLANT Left 2018   DEXA  7/07   negative   EMG     left upper arm- neg   ESOPHAGOGASTRODUODENOSCOPY     normal-24 hour PH probe (-)   EYE SURGERY  10/09   Left eye; for double vision   PARTIAL HYSTERECTOMY     Ovaries intact   TUBAL LIGATION  Social History   Tobacco Use   Smoking status: Never   Smokeless tobacco: Never  Substance Use Topics   Alcohol use: No    Alcohol/week: 0.0 standard drinks of alcohol   Drug use: No   Family History  Problem Relation Age of Onset   Breast cancer Mother        Died, 34   Other Father        Died, 67 - tree fell on him   Breast cancer Sister        Died, 76   Heart disease Sister        Died, 97   Healthy Son    Healthy Daughter    Allergies  Allergen Reactions   Sulfonamide Derivatives Anaphylaxis and Other (See Comments)    REACTION: unspecified Hives and swelling   Current Outpatient Medications on File Prior to Visit  Medication Sig Dispense Refill   ALPRAZolam (XANAX) 0.25 MG tablet Take 0.25 mg by mouth daily as needed (for airplane flights).     fluticasone  (FLONASE) 50 MCG/ACT nasal spray Place into both nostrils daily. As needed     methocarbamol (ROBAXIN) 500 MG tablet Take 1 tablet (500 mg total) by mouth every 8 (eight) hours as needed for muscle spasms (headache, neck pain). Caution of sedation 30 tablet 2   Polyethyl Glycol-Propyl Glycol 0.4-0.3 % SOLN Place 1 drop into both eyes as needed.     traZODone (DESYREL) 50 MG tablet TAKE 1-2 TABLETS (50-100 MG TOTAL) BY MOUTH AT BEDTIME AS NEEDED. FOR SLEEP 60 tablet 11   No current facility-administered medications on file prior to visit.    Review of Systems  Constitutional:  Positive for fatigue. Negative for activity change, appetite change, fever and unexpected weight change.  HENT:  Negative for congestion, ear pain, rhinorrhea, sinus pressure and sore throat.   Eyes:  Negative for pain, redness and visual disturbance.  Respiratory:  Negative for cough, shortness of breath and wheezing.   Cardiovascular:  Negative for chest pain and palpitations.  Gastrointestinal:  Negative for abdominal pain, blood in stool, constipation and diarrhea.  Endocrine: Negative for polydipsia and polyuria.  Genitourinary:  Negative for dysuria, frequency and urgency.  Musculoskeletal:  Positive for neck pain. Negative for arthralgias, back pain and myalgias.  Skin:  Negative for pallor and rash.       Wound on nose-waiting for it to heal  Allergic/Immunologic: Negative for environmental allergies.  Neurological:  Negative for dizziness, syncope and headaches.       Tingling pain around left ear and down left neck  Does not rad to hand or arm  Hematological:  Negative for adenopathy. Does not bruise/bleed easily.  Psychiatric/Behavioral:  Negative for decreased concentration and dysphoric mood. The patient is not nervous/anxious.        Objective:   Physical Exam Constitutional:      General: She is not in acute distress.    Appearance: Normal appearance. She is normal weight. She is not  ill-appearing.  HENT:     Head: Normocephalic and atraumatic.     Right Ear: Tympanic membrane, ear canal and external ear normal. There is no impacted cerumen.     Left Ear: Tympanic membrane, ear canal and external ear normal. There is no impacted cerumen.     Nose: Nose normal.     Mouth/Throat:     Mouth: Mucous membranes are moist.     Pharynx: Oropharynx is clear.  Eyes:  General: No scleral icterus.       Right eye: No discharge.        Left eye: No discharge.     Extraocular Movements: Extraocular movements intact.     Conjunctiva/sclera: Conjunctivae normal.     Pupils: Pupils are equal, round, and reactive to light.  Cardiovascular:     Rate and Rhythm: Normal rate and regular rhythm.     Heart sounds: Normal heart sounds.  Pulmonary:     Effort: Pulmonary effort is normal. No respiratory distress.     Breath sounds: Normal breath sounds. No wheezing or rales.  Musculoskeletal:     Cervical back: Neck supple.  Lymphadenopathy:     Cervical: No cervical adenopathy.  Skin:    General: Skin is warm and dry.     Coloration: Skin is not jaundiced or pale.     Findings: No bruising, erythema, lesion or rash.     Comments: Area surrounding left ear is sensitive to touch but not tender No rash seen  No swelling or skin breakdown  Neurological:     Mental Status: She is alert.     Cranial Nerves: No cranial nerve deficit.     Sensory: No sensory deficit.     Motor: No weakness.     Coordination: Coordination normal.     Deep Tendon Reflexes: Reflexes normal.     Comments: Area around left ear is sensitive but not tender No rash or swelling   Psychiatric:        Mood and Affect: Mood normal.           Assessment & Plan:   Problem List Items Addressed This Visit       Cardiovascular and Mediastinum   Essential hypertension    Blood pressure was up today in setting of pain and stress reaction  Asked pt to check at home when relaxed periodically  Instructed  to follow up if not uner 140/90          Nervous and Auditory   Post herpetic neuralgia - Primary    Suspect this is cause for discomfort of left ear and neck  In area of prevention zoster No rash today -reassuring  Recent stress (situational and nose/skin surgery) may have played a factor   Will plan to  Increase gabapentin as tolerated to 300 mg bid and 100 mg mid day if needed  (watch for sedation or change in balance or memory with this)  Update if not starting to improve in a week or if worsening   Call back and Er precautions noted in detail today           Other   Stress reaction    Pt is worried about issues within her church  May have to change congregations  Reviewed stressors/ coping techniques/symptoms/ support sources/ tx options and side effects in detail today

## 2023-04-02 NOTE — Assessment & Plan Note (Signed)
Blood pressure was up today in setting of pain and stress reaction  Asked pt to check at home when relaxed periodically  Instructed to follow up if not uner 140/90

## 2023-04-02 NOTE — Assessment & Plan Note (Signed)
Suspect this is cause for discomfort of left ear and neck  In area of prevention zoster No rash today -reassuring  Recent stress (situational and nose/skin surgery) may have played a factor   Will plan to  Increase gabapentin as tolerated to 300 mg bid and 100 mg mid day if needed  (watch for sedation or change in balance or memory with this)  Update if not starting to improve in a week or if worsening   Call back and Er precautions noted in detail today

## 2023-04-02 NOTE — Assessment & Plan Note (Signed)
Pt is worried about issues within her church  May have to change congregations  Reviewed stressors/ coping techniques/symptoms/ support sources/ tx options and side effects in detail today

## 2023-07-31 ENCOUNTER — Encounter: Payer: Self-pay | Admitting: Neurology

## 2023-08-26 ENCOUNTER — Ambulatory Visit: Payer: Medicare Other | Admitting: Neurology

## 2023-09-03 ENCOUNTER — Ambulatory Visit: Payer: Medicare Other | Admitting: Neurology

## 2023-09-16 ENCOUNTER — Ambulatory Visit (INDEPENDENT_AMBULATORY_CARE_PROVIDER_SITE_OTHER): Payer: Medicare Other | Admitting: Neurology

## 2023-09-16 ENCOUNTER — Encounter: Payer: Self-pay | Admitting: Neurology

## 2023-09-16 VITALS — BP 152/62 | HR 70 | Ht 59.0 in | Wt 129.0 lb

## 2023-09-16 DIAGNOSIS — G5601 Carpal tunnel syndrome, right upper limb: Secondary | ICD-10-CM

## 2023-09-16 DIAGNOSIS — B0229 Other postherpetic nervous system involvement: Secondary | ICD-10-CM | POA: Diagnosis not present

## 2023-09-16 DIAGNOSIS — M25532 Pain in left wrist: Secondary | ICD-10-CM | POA: Diagnosis not present

## 2023-09-16 NOTE — Progress Notes (Signed)
 Follow-up Visit   Date: 09/16/2023    Joann Buchanan MRN: [id number] DOB: September 29, 1945    Joann Buchanan is a 78 y.o. right-handed Caucasian female with  GERD, HTN, cervical myelopathy at C4-5 s/p ACDF returning to the clinic for post herpetic neuralgia and neck pain.  The patient was accompanied to the clinic by daughter.  IMPRESSION/PLAN: Post-herpetic neuralgia, stable  - Continue gabapentin 300mg  in the morning and 100mg  in the evening  Right carpal tunnel syndrome, moderate-to-severe with worsening pain  - Refer to [facility] orthope[facility]ics for steroid injection.  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 Yetta Barre).  MRI cervical spine shows adjacent disease at C5-6 and mild multilevel foraminal stenosis at the upper levels.  She has completed PT.  Encouraged her to continue home exercises.  Left wrist pain and swelling.  She denies any recent trauma. She does pain with wrist movement and there is swelling on exam. Recommend that she go to ortho urgent care   5.  Malaise, fatigue   - Discussed checking TSH and vitamin B12, however, she would like to see her PCP first  Return to clinic in 6 months  --------------------------------------------- History of present illness: She was previously seen by me in 2014 - 2017 for migraines, occipital neuralgia, and cervical myelopathy.  She underwent C4-5 ACDF in 2017.  Regarding her migraines, these started in her late 72s and stopped in her 34s.  Migraines started intermittently in her 27s and slowly intensified.Pain is usually over the left side of her head or at the base of her neck. Pain is described as sharp, dull/grinding pain.     She also complains of right hand tingling.  Symptoms have been ongoing since prior to her neck surgery.   UPDATE 10/12/2022:  She continues to have daily headaches.  She was taking topiramate 25mg  in the morning but felt that it made her dizzy, so  now taking it at bedtime. Dizziness has improved.  She has many questions whether she can take trazadone with topiramate, which I told her would be fine.  She briefly stropped trazadone and has been feeling more nervous and anxious.    UPDATE 11/19/2022:  About 2 weeks ago, she reports having shingles over the left side of the face and scalp.  She continues to have shooting pain over the jaw and neck.  She is taking gabapentin 100mg  three times daily (8:30a, 12:30p, bedtime) which has helped. She denies sedation and is tolerating the medication well.  Migraines have significantly improved. She stopped PT after her shingles caused worsening pain.  She is currently not taking topiramate.   UPDATE 02/19/2023:  She is here for follow-up visit.  Her facial and scalp pain is doing much better gabapentin 200mg  three times daily + extra tablet daily.  She completed neck PT which has helped as well.  She also tells me that her right hand numbness intensified yesterday after she starting doing more household chores.  She has known moderate-severe right CTS. Since her last visit, she was diagnosed with melanoma of the nose and underwent surgery for this.  She is still healing.   UPDATE 09/16/2023:  She is here for 6 month follow-up visit.   She takes gabapentin 300mg  in the morning and 100mg  at 5pm.  Her facial pain is well-controlled on this dose.    She is most bothered by new onset left wrist pain and swelling  which started two weeks ago.  She woke up with pain and it has not improved, despite taking NSAIDs. She is having difficulty with using the hand because of pain.   She is also have worsening right hand numbness and sharp shooting pain in the hand.  She also reports always being tired, even upon waking up.   Medications:  Current Outpatient Medications on File Prior to Visit  Medication Sig Dispense Refill   ALPRAZolam (XANAX) 0.25 MG tablet Take 0.25 mg by mouth daily as needed (for airplane flights).      fluticasone (FLONASE) 50 MCG/ACT nasal spray Place 2 sprays into both nostrils daily as needed for allergies or rhinitis. 16 mL 2   gabapentin (NEURONTIN) 100 MG capsule Take 1 capsule (100 mg total) by mouth daily. Mid day 90 capsule 1   gabapentin (NEURONTIN) 300 MG capsule Take 1 capsule (300 mg total) by mouth 2 (two) times daily. (Patient taking differently: Take 300 mg by mouth daily. One capsule in the morning) 180 capsule 1   methocarbamol (ROBAXIN) 500 MG tablet Take 1 tablet (500 mg total) by mouth every 8 (eight) hours as needed for muscle spasms (headache, neck pain). Caution of sedation 30 tablet 2   Polyethyl Glycol-Propyl Glycol 0.4-0.3 % SOLN Place 1 drop into both eyes as needed.     traZODone (DESYREL) 50 MG tablet TAKE 1-2 TABLETS (50-100 MG TOTAL) BY MOUTH AT BEDTIME AS NEEDED. FOR SLEEP 60 tablet 11   No current facility-administered medications on file prior to visit.    Allergies:  Allergies  Allergen Reactions   Sulfonamide Derivatives Anaphylaxis and Other (See Comments)    REACTION: unspecified Hives and swelling    Vital Signs:  BP (!) 152/62   Pulse 70   Ht 4\' 11"  (1.499 m)   Wt 129 lb (58.5 kg)   SpO2 99%   BMI 26.05 kg/m    Neurological Exam: MENTAL STATUS including orientation to time, place, person, recent and remote memory, attention span and concentration, language, and fund of knowledge is normal.  Speech is not dysarthric.  CRANIAL NERVES: Pupils equal round and reactive to light.  Normal conjugate, extra-ocular eye movements in all directions of gaze.  No ptosis . Normal facial sensation.  Face is symmetric.       MOTOR:  Left wrist appears swollen with tenderness with ROM testing.  Motor strength is 5/5 in all extremities (pain limited in the left hand/wrist).  No atrophy, fasciculations or abnormal movements.  No pronator drift.  Tone is normal.    MSRs:  Reflexes are 3+/4 throughout  SENSORY:  Intact to vibration  throughout.  COORDINATION/GAIT:   Gait narrow based and stable.    Data: NCS/EMG of the right arm 10/12/2022: Right median neuropathy at or distal to the wrist, consistent with a clinical diagnosis of carpal tunnel syndrome.  Overall, these findings are moderate-to-severe in degree electrically.   Thank you for allowing me to participate in patient's care.  If I can answer any additional questions, I would be pleased to do so.    Sincerely,    Sharayah Renfrow K. Allena Katz, DO

## 2023-09-16 NOTE — Patient Instructions (Addendum)
 We will refer you to [facility]   [facility] Address: [address], [address], [place], [place] [address] Phone: [phone]

## 2023-10-29 ENCOUNTER — Telehealth: Payer: Self-pay

## 2023-10-29 NOTE — Telephone Encounter (Signed)
 Thanks for the heads up  I will see her as planned  No further advice  I think she is seeing neuro and ortho

## 2023-10-29 NOTE — Telephone Encounter (Signed)
 Patient walked in office thought she had visit today with PCP. She was scheduled for visit with health coach. Patient became very upset at reception wanted to be seen today. I have reviewed questions with patient. She sates tearfully that she is having joint and bone pain all over her body. It has been going on for a while but increased over time. States when she has a good day she "works herself to death and can't do anything the next day". Patient became tearful several times during out conversation. Verified no suicidal ideation. If she has any she will call 911. I have reviewed all red words with patient if any she will be seen at ED. I have scheduled her to see Dr. Malissa Se on Thursday. She will reach out if any questions before then. Patient denies any SOB or chest pains. States she is just in pain and tired and just wants to feel better.   Patient is currently on Prednisone  taper she is almost finished with taper.  but states that symptoms started before she started medications. She is taking gabapentin  300mg  bid as well as robaxin  prn normally bid. Has been evaluated recently by orthopedics for Gout and carpal tunnel. She has surgery scheduled for 2 weeks for that.    Advised patient if any further recommendations from PCP we will reach out.

## 2023-10-31 ENCOUNTER — Encounter: Payer: Self-pay | Admitting: Family Medicine

## 2023-10-31 ENCOUNTER — Ambulatory Visit (INDEPENDENT_AMBULATORY_CARE_PROVIDER_SITE_OTHER): Admitting: Family Medicine

## 2023-10-31 VITALS — BP 124/72 | HR 78 | Temp 98.2°F | Ht 59.0 in | Wt 126.2 lb

## 2023-10-31 DIAGNOSIS — F430 Acute stress reaction: Secondary | ICD-10-CM | POA: Diagnosis not present

## 2023-10-31 DIAGNOSIS — B0229 Other postherpetic nervous system involvement: Secondary | ICD-10-CM

## 2023-10-31 DIAGNOSIS — R7303 Prediabetes: Secondary | ICD-10-CM

## 2023-10-31 DIAGNOSIS — E785 Hyperlipidemia, unspecified: Secondary | ICD-10-CM

## 2023-10-31 DIAGNOSIS — M2550 Pain in unspecified joint: Secondary | ICD-10-CM | POA: Insufficient documentation

## 2023-10-31 DIAGNOSIS — I10 Essential (primary) hypertension: Secondary | ICD-10-CM | POA: Diagnosis not present

## 2023-10-31 DIAGNOSIS — R5383 Other fatigue: Secondary | ICD-10-CM | POA: Insufficient documentation

## 2023-10-31 DIAGNOSIS — R5382 Chronic fatigue, unspecified: Secondary | ICD-10-CM

## 2023-10-31 DIAGNOSIS — E538 Deficiency of other specified B group vitamins: Secondary | ICD-10-CM

## 2023-10-31 DIAGNOSIS — R202 Paresthesia of skin: Secondary | ICD-10-CM

## 2023-10-31 NOTE — Progress Notes (Signed)
 Subjective:    Patient ID: Joann Buchanan, female    DOB: [birth year], 78 y.o.   MRN: [id number]  HPI  Wt Readings from Last 3 Encounters:  10/31/23 126 lb 4 oz (57.3 kg)  09/16/23 129 lb (58.5 kg)  04/02/23 125 lb 2 oz (56.8 kg)   25.50 kg/m  Vitals:   10/31/23 1203  BP: 124/72  Pulse: 78  Temp: 98.2 F (36.8 C)  SpO2: 97%    Pt presents for c/o joint pain all over and fatigue  Walked in yesterday c/o joint and bone pain  Says she works herself to death and then cannot function the next day  Was noted to be tearful   Finishing prednisone  taper Takes gabapentin  300 mg bid as well  Robaxin   Eval by neuro and ortho recently -gout and carpal tunnel and surgery is schedule  Also cervical spondylosis and myelopathy (cervical fusion in past)   Neuro noted she c/o malaise and fatigue at last visit    Trazodone  50-100 mg at bedtime  Tramadol  prn (not often)  Methocarbamol  -not using   Overwhelmed Tired /exhausted  So much going on  Had covid and shingles   Does have good and bad days  Pushes herself   Now  Shoulders hurt/esp right  Hips hurt (on the outside)    Pain makes her cry  Otherwise thinks mood is ok  Stress- son is angry about losing his father and she is the victim of his anger   Stays a little short of breath   More anxious  Is on prednisone      No results found for: "ANA" Lab Results  Component Value Date   ESRSEDRATE 9 02/21/2022   Lab Results  Component Value Date   WBC 7.9 03/05/2022   HGB 12.8 03/05/2022   HCT 39.3 03/05/2022   MCV 90.8 03/05/2022   PLT 320 03/05/2022   Lab Results  Component Value Date   TSH 0.87 08/25/2021   Lab Results  Component Value Date   NA 140 03/05/2022   K 3.1 (L) 03/05/2022   CO2 25 03/05/2022   GLUCOSE 102 (H) 03/05/2022   BUN 17 03/05/2022   CREATININE 0.88 03/05/2022   CALCIUM  10.1 03/05/2022   GFR 51.57 (L) 03/24/2019   GFRNONAA >60 03/05/2022   Lab Results  Component Value Date    ALT 15 03/05/2022   AST 19 03/05/2022   ALKPHOS 57 03/05/2022   BILITOT 0.5 03/05/2022       Patient Active Problem List   Diagnosis Date Noted   Encounter for screening mammogram for breast cancer 10/30/2016    Priority: Low   Multiple joint pain 10/31/2023   Fatigue 10/31/2023   Post herpetic neuralgia 04/02/2023   Stress reaction 04/02/2023   History of TIA (transient ischemic attack) 02/20/2022   Insomnia 02/20/2022   Low serum vitamin B12 09/05/2021   Hyperlipidemia, mild 08/30/2021   Palpitations 08/30/2021   Dysphagia 08/25/2021   Grief reaction 11/30/2020   Prediabetes 05/09/2018   S/P cervical spinal fusion 12/02/2015   Osteopenia 10/25/2015   Estrogen deficiency 10/05/2015   Routine general medical examination at a health care facility 09/25/2015   Hand tingling 04/05/2015   Osteoarthrosis, hand 11/01/2009   Menopausal symptoms 03/23/2009   History of melanoma 02/04/2007   Goiter 02/04/2007   Migraine 02/04/2007   Essential hypertension 02/04/2007   Allergic rhinitis 02/04/2007   GERD 02/04/2007   Past Medical History:  Diagnosis Date   Allergic rhinitis  GERD (gastroesophageal reflux disease)    Goiter    ?    HTN (hypertension)    Hx of fibrocystic disease of breast    Melanoma (HCC)    Migraine    Numbness and tingling    bilateral hands   Osteoarthritis of hand    PONV (postoperative nausea and vomiting)    Past Surgical History:  Procedure Laterality Date   ANTERIOR CERVICAL DECOMP/DISCECTOMY FUSION N/A 12/02/2015   Procedure: ANTERIOR CERVICAL DECOMPRESSION/DISCECTOMY FUSION CERVICAL FOUR-FIVE;  Surgeon: Isadora Mar, MD;  Location: [facility];  Service: Neurosurgery;  Laterality: N/A;   APPENDECTOMY     BREAST BIOPSY     normal   CATARACT EXTRACTION Right Jan 2017   CATARACT EXTRACTION W/ INTRAOCULAR LENS IMPLANT Left 2018   DEXA  7/07   negative   EMG     left upper arm- neg   ESOPHAGOGASTRODUODENOSCOPY     normal-24 hour PH  probe (-)   EYE SURGERY  10/09   Left eye; for double vision   PARTIAL HYSTERECTOMY     Ovaries intact   TUBAL LIGATION     Social History   Tobacco Use   Smoking status: Never   Smokeless tobacco: Never  Substance Use Topics   Alcohol  use: No    Alcohol /week: 0.0 standard drinks of alcohol    Drug use: No   Family History  Problem Relation Age of Onset   Breast cancer Mother        Died, 59   Other Father        Died, 23 - tree fell on him   Breast cancer Sister        Died, 4   Heart disease Sister        Died, 96   Healthy Son    Healthy Daughter    Allergies  Allergen Reactions   Sulfonamide Derivatives Anaphylaxis and Other (See Comments)    REACTION: unspecified Hives and swelling   Current Outpatient Medications on File Prior to Visit  Medication Sig Dispense Refill   ALPRAZolam  (XANAX ) 0.25 MG tablet Take 0.25 mg by mouth daily as needed (for airplane flights).     fluticasone  (FLONASE ) 50 MCG/ACT nasal spray Place 2 sprays into both nostrils daily as needed for allergies or rhinitis. 16 mL 2   gabapentin  (NEURONTIN ) 100 MG capsule Take 1 capsule (100 mg total) by mouth daily. Mid day 90 capsule 1   gabapentin  (NEURONTIN ) 300 MG capsule Take 1 capsule (300 mg total) by mouth 2 (two) times daily. (Patient taking differently: Take 300 mg by mouth daily. One capsule in the morning) 180 capsule 1   Polyethyl Glycol-Propyl Glycol 0.4-0.3 % SOLN Place 1 drop into both eyes as needed.     predniSONE  (STERAPRED UNI-PAK 21 TAB) 10 MG (21) TBPK tablet Take by mouth. Taper dose     traMADol  (ULTRAM ) 50 MG tablet Take 50 mg by mouth every 6 (six) hours.     traZODone  (DESYREL ) 50 MG tablet TAKE 1-2 TABLETS (50-100 MG TOTAL) BY MOUTH AT BEDTIME AS NEEDED. FOR SLEEP 60 tablet 11   No current facility-administered medications on file prior to visit.    Review of Systems  Constitutional:  Positive for fatigue. Negative for activity change, appetite change, fever and  unexpected weight change.  HENT:  Negative for congestion, ear pain, rhinorrhea, sinus pressure and sore throat.   Eyes:  Negative for pain, redness and visual disturbance.  Respiratory:  Negative for cough,  shortness of breath and wheezing.   Cardiovascular:  Negative for chest pain and palpitations.  Gastrointestinal:  Negative for abdominal pain, blood in stool, constipation and diarrhea.  Endocrine: Negative for polydipsia and polyuria.  Genitourinary:  Negative for dysuria, frequency and urgency.  Musculoskeletal:  Positive for arthralgias, back pain and myalgias.  Skin:  Negative for pallor and rash.  Allergic/Immunologic: Negative for environmental allergies.  Neurological:  Positive for numbness. Negative for dizziness, syncope and headaches.  Hematological:  Negative for adenopathy. Does not bruise/bleed easily.  Psychiatric/Behavioral:  Positive for decreased concentration and sleep disturbance. Negative for dysphoric mood, self-injury and suicidal ideas. The patient is nervous/anxious.        Objective:   Physical Exam Constitutional:      General: She is not in acute distress.    Appearance: Normal appearance. She is well-developed and normal weight. She is not ill-appearing or diaphoretic.  HENT:     Head: Normocephalic and atraumatic.  Eyes:     Conjunctiva/sclera: Conjunctivae normal.     Pupils: Pupils are equal, round, and reactive to light.  Neck:     Thyroid : No thyromegaly.     Vascular: No carotid bruit or JVD.  Cardiovascular:     Rate and Rhythm: Normal rate and regular rhythm.     Heart sounds: Normal heart sounds.     No gallop.  Pulmonary:     Effort: Pulmonary effort is normal. No respiratory distress.     Breath sounds: Normal breath sounds. No wheezing or rales.  Abdominal:     General: There is no distension or abdominal bruit.     Palpations: Abdomen is soft.  Musculoskeletal:     Cervical back: Normal range of motion and neck supple.     Right  lower leg: No edema.     Left lower leg: No edema.     Comments: No acute joint changes  Wrist splint on right   Tender trap muscles bilaterally  Limited rom of CS  Lymphadenopathy:     Cervical: No cervical adenopathy.  Skin:    General: Skin is warm and dry.     Coloration: Skin is not pale.     Findings: No rash.  Neurological:     Mental Status: She is alert.     Cranial Nerves: No cranial nerve deficit.     Motor: No weakness.     Coordination: Coordination normal.     Deep Tendon Reflexes: Reflexes are normal and symmetric. Reflexes normal.  Psychiatric:        Mood and Affect: Mood is anxious. Affect is tearful.        Speech: Speech normal.        Behavior: Behavior normal.     Comments: Candidly discusses symptoms and stressors             Assessment & Plan:   Problem List Items Addressed This Visit       Cardiovascular and Mediastinum   Essential hypertension   bp in fair control at this time  BP Readings from Last 1 Encounters:  10/31/23 124/72   No changes needed Most recent labs reviewed  Disc lifstyle change with low sodium diet and exercise  No medications Plan labs incl cholesterol and follow up       Relevant Orders   TSH   Comprehensive metabolic panel with GFR   Lipid panel     Nervous and Auditory   Post herpetic neuralgia   Continues gabapentin   May also help carpal tunnel/chronic pain and anxious mood  300 mg bid         Other   Stress reaction   Worsened lately  Prednisone  may be causing anxiety  Reviewed stressors/ coping techniques/symptoms/ support sources/ tx options and side effects in detail today Discussed issues with son and also grief Aging / chronic pain   Lab today  Follow up after to discuss further  May benefit from counseling n future       Prediabetes   A1c added to next labs Follow up afterwards       Relevant Orders   Comprehensive metabolic panel with GFR   Hemoglobin A1c   Multiple joint pain    In setting of cervical spondylopathy [place] Carpal tunnel syndrome  Right shoulder-on /off Shoulder girdle and upper body  Worsened with recent anxiety and fatigue  Labs ordered   Follow up after       Relevant Orders   VITAMIN D 25 Hydroxy (Vit-D Deficiency, Fractures)   ANA w/Reflex   Rheumatoid factor   Sedimentation Rate   Low serum vitamin B12   B12 added to next labs More fatigued  No longer on ppi      Relevant Orders   Vitamin B12   Hyperlipidemia, mild   Disc goals for lipids and reasons to control them Rev last labs with pt Rev low sat fat diet in detail Labs planned   Follow up after labs to review       Relevant Orders   Comprehensive metabolic panel with GFR   Lipid panel   Hand tingling   Planning carpal tunnel surgery on right hand  Hass seen neuro and ortho       Fatigue - Primary   Multifactorial  Mood/stress Chronic medical problems and pain  Labs today      Relevant Orders   VITAMIN D 25 Hydroxy (Vit-D Deficiency, Fractures)   T4, free   Iron   CBC with Differential/Platelet   Vitamin B12   TSH   Comprehensive metabolic panel with GFR

## 2023-10-31 NOTE — Assessment & Plan Note (Signed)
 Continues gabapentin   May also help carpal tunnel/chronic pain and anxious mood  300 mg bid

## 2023-10-31 NOTE — Assessment & Plan Note (Signed)
 bp in fair control at this time  BP Readings from Last 1 Encounters:  10/31/23 124/72   No changes needed Most recent labs reviewed  Disc lifstyle change with low sodium diet and exercise  No medications Plan labs incl cholesterol and follow up

## 2023-10-31 NOTE — Assessment & Plan Note (Signed)
 In setting of cervical spondylopathy [place] Carpal tunnel syndrome  Right shoulder-on /off Shoulder girdle and upper body  Worsened with recent anxiety and fatigue  Labs ordered   Follow up after

## 2023-10-31 NOTE — Assessment & Plan Note (Signed)
 Will add B12 to upcoming labs

## 2023-10-31 NOTE — Patient Instructions (Addendum)
 Give yourself more time to do things  Take more frequent breaks Spread out tasks and activities more  Do more for yourself  Don't push too hard/ even if you feel good   Let's get some lab work in about 7-10 days (so you are off the prednisone  for a week)  Your anxiety should start to calm down   Follow up after that and we will review labs and make a plan   Continue gabapentin  300 mg twice daily  This helps pain and mood  If side effects- dizzy/ sedated-let us  know

## 2023-10-31 NOTE — Assessment & Plan Note (Signed)
 A1c added to next labs Follow up afterwards

## 2023-10-31 NOTE — Assessment & Plan Note (Signed)
 Worsened lately  Prednisone  may be causing anxiety  Reviewed stressors/ coping techniques/symptoms/ support sources/ tx options and side effects in detail today Discussed issues with son and also grief Aging / chronic pain   Lab today  Follow up after to discuss further  May benefit from counseling n future

## 2023-10-31 NOTE — Assessment & Plan Note (Signed)
 Multifactorial  Mood/stress Chronic medical problems and pain  Labs today

## 2023-10-31 NOTE — Assessment & Plan Note (Signed)
 Disc goals for lipids and reasons to control them Rev last labs with pt Rev low sat fat diet in detail Labs planned   Follow up after labs to review

## 2023-10-31 NOTE — Assessment & Plan Note (Signed)
 B12 added to next labs More fatigued  No longer on ppi

## 2023-10-31 NOTE — Assessment & Plan Note (Signed)
 Planning carpal tunnel surgery on right hand  Hass seen neuro and ortho

## 2023-11-06 ENCOUNTER — Encounter: Payer: Self-pay | Admitting: Family Medicine

## 2023-11-06 ENCOUNTER — Ambulatory Visit (INDEPENDENT_AMBULATORY_CARE_PROVIDER_SITE_OTHER): Admitting: Family Medicine

## 2023-11-06 VITALS — BP 138/65 | HR 84 | Temp 97.9°F | Ht 59.0 in | Wt 128.4 lb

## 2023-11-06 DIAGNOSIS — I10 Essential (primary) hypertension: Secondary | ICD-10-CM | POA: Diagnosis not present

## 2023-11-06 DIAGNOSIS — E538 Deficiency of other specified B group vitamins: Secondary | ICD-10-CM | POA: Diagnosis not present

## 2023-11-06 DIAGNOSIS — R7303 Prediabetes: Secondary | ICD-10-CM

## 2023-11-06 DIAGNOSIS — R5382 Chronic fatigue, unspecified: Secondary | ICD-10-CM

## 2023-11-06 DIAGNOSIS — M2550 Pain in unspecified joint: Secondary | ICD-10-CM

## 2023-11-06 DIAGNOSIS — R42 Dizziness and giddiness: Secondary | ICD-10-CM | POA: Diagnosis not present

## 2023-11-06 DIAGNOSIS — E785 Hyperlipidemia, unspecified: Secondary | ICD-10-CM | POA: Diagnosis not present

## 2023-11-06 DIAGNOSIS — F430 Acute stress reaction: Secondary | ICD-10-CM | POA: Diagnosis not present

## 2023-11-06 NOTE — Progress Notes (Signed)
 Subjective:    Patient ID: Joann Buchanan, female    DOB: 1945-11-28, 78 y.o.   MRN: [id number]  HPI  Wt Readings from Last 3 Encounters:  11/06/23 128 lb 6.4 oz (58.2 kg)  10/31/23 126 lb 4 oz (57.3 kg)  09/16/23 129 lb (58.5 kg)   25.93 kg/m  Vitals:   11/06/23 1500 11/06/23 1530  BP: (!) 142/74 138/65  Pulse: 84   Temp: 97.9 F (36.6 C)   SpO2: 95%     Pt presents for episode of dizziness this am  Also follow up of fatigue/ due for labs   Woke up this am  Bed was spinning  Worse when turning over  Called [facility] to come over across the street  She had a hard time walking  Feet were swollen yesterday/not today   Felt good yesterday  Now feels jittery and weak  Did get a manicure and pedicure     Has a wedding Friday    Remote history of TIA vs migraine   Last visit was feeling more anxious- on a course of prednisone   Also dealing with aches and pains We discussed taking more time to do things - spreading out tasks   Was planning on labs a week after prednisone  is done     HTN bp is stable today  No cp or palpitations or headaches or edema  No side effects to medicines  BP Readings from Last 3 Encounters:  11/06/23 138/65  10/31/23 124/72  09/16/23 (!) 152/62           Patient Active Problem List   Diagnosis Date Noted   Encounter for screening mammogram for breast cancer 10/30/2016    Priority: Low   Dizziness 11/06/2023   Multiple joint pain 10/31/2023   Fatigue 10/31/2023   Post herpetic neuralgia 04/02/2023   Stress reaction 04/02/2023   History of TIA (transient ischemic attack) 02/20/2022   Insomnia 02/20/2022   Low serum vitamin B12 09/05/2021   Hyperlipidemia, mild 08/30/2021   Palpitations 08/30/2021   Dysphagia 08/25/2021   Grief reaction 11/30/2020   Prediabetes 05/09/2018   S/P cervical spinal fusion 12/02/2015   Osteopenia 10/25/2015   Estrogen deficiency 10/05/2015   Routine general medical examination at a health  care facility 09/25/2015   Hand tingling 04/05/2015   Osteoarthrosis, 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    HTN (hypertension)    Hx of fibrocystic disease of breast    Melanoma (HCC)    Migraine    Numbness and tingling    bilateral hands   Osteoarthritis of hand    PONV (postoperative nausea and vomiting)    Past Surgical History:  Procedure Laterality Date   ANTERIOR CERVICAL DECOMP/DISCECTOMY FUSION N/A 12/02/2015   Procedure: ANTERIOR CERVICAL DECOMPRESSION/DISCECTOMY FUSION CERVICAL FOUR-FIVE;  Surgeon: Isadora Mar, MD;  Location: [facility];  Service: Neurosurgery;  Laterality: N/A;   APPENDECTOMY     BREAST BIOPSY     normal   CATARACT EXTRACTION Right Jan 2017   CATARACT EXTRACTION W/ INTRAOCULAR LENS IMPLANT Left 2018   DEXA  7/07   negative   EMG     left  upper arm- neg   ESOPHAGOGASTRODUODENOSCOPY     normal-24 hour PH probe (-)   EYE SURGERY  10/09   Left eye; for double vision   PARTIAL HYSTERECTOMY     Ovaries intact   TUBAL LIGATION     Social History   Tobacco Use   Smoking status: Never   Smokeless tobacco: Never  Substance Use Topics   Alcohol  use: No    Alcohol /week: 0.0 standard drinks of alcohol    Drug use: No   Family History  Problem Relation Age of Onset   Breast cancer Mother        Died, 35   Other Father        Died, 56 - tree fell on him   Breast cancer Sister        Died, 56   Heart disease Sister        Died, 39   Healthy Son    Healthy Daughter    Allergies  Allergen Reactions   Sulfonamide Derivatives Other (See Comments) and Hives   Current Outpatient Medications on File Prior to Visit  Medication Sig Dispense Refill    ALPRAZolam  (XANAX ) 0.25 MG tablet Take 0.25 mg by mouth daily as needed (for airplane flights).     fluticasone  (FLONASE ) 50 MCG/ACT nasal spray Place 2 sprays into both nostrils daily as needed for allergies or rhinitis. 16 mL 2   gabapentin  (NEURONTIN ) 100 MG capsule Take 1 capsule (100 mg total) by mouth daily. Mid day 90 capsule 1   gabapentin  (NEURONTIN ) 300 MG capsule Take 1 capsule (300 mg total) by mouth 2 (two) times daily. (Patient taking differently: Take 300 mg by mouth daily. One capsule in the morning) 180 capsule 1   Polyethyl Glycol-Propyl Glycol 0.4-0.3 % SOLN Place 1 drop into both eyes as needed.     traMADol  (ULTRAM ) 50 MG tablet Take 50 mg by mouth every 6 (six) hours.     traZODone  (DESYREL ) 50 MG tablet TAKE 1-2 TABLETS (50-100 MG TOTAL) BY MOUTH AT BEDTIME AS NEEDED. FOR SLEEP 60 tablet 11   No current facility-administered medications on file prior to visit.    Review of Systems  Constitutional:  Positive for fatigue. Negative for activity change, appetite change, fever and unexpected weight change.  HENT:  Positive for postnasal drip. Negative for congestion, ear pain, rhinorrhea, sinus pressure and sore throat.   Eyes:  Negative for pain, redness and visual disturbance.  Respiratory:  Negative for cough, shortness of breath and wheezing.   Cardiovascular:  Negative for chest pain and palpitations.  Gastrointestinal:  Negative for abdominal pain, blood in stool, constipation and diarrhea.  Endocrine: Negative for polydipsia and polyuria.  Genitourinary:  Negative for dysuria, frequency and urgency.  Musculoskeletal:  Positive for arthralgias, back pain and myalgias.  Skin:  Negative for pallor and rash.  Allergic/Immunologic: Negative for environmental allergies.  Neurological:  Positive for dizziness. Negative for tremors, seizures, syncope, facial asymmetry, speech difficulty, weakness, light-headedness, numbness and headaches.  Hematological:  Negative for  adenopathy. Does not bruise/bleed easily.  Psychiatric/Behavioral:  Negative for decreased concentration and dysphoric mood. The patient is nervous/anxious.        Objective:   Physical Exam Constitutional:      General: She is not in acute distress.    Appearance: Normal appearance. She is well-developed. She is not ill-appearing or diaphoretic.  HENT:     Head: Normocephalic and atraumatic.     Right Ear: External ear normal.  Left Ear: External ear normal.     Nose: Nose normal.     Mouth/Throat:     Pharynx: No oropharyngeal exudate.  Eyes:     General: No scleral icterus.       Right eye: No discharge.        Left eye: No discharge.     Conjunctiva/sclera: Conjunctivae normal.     Pupils: Pupils are equal, round, and reactive to light.     Comments: No nystagmus  Neck:     Thyroid : No thyromegaly.     Vascular: No carotid bruit or JVD.     Trachea: No tracheal deviation.  Cardiovascular:     Rate and Rhythm: Normal rate and regular rhythm.     Heart sounds: Normal heart sounds. No murmur heard. Pulmonary:     Effort: Pulmonary effort is normal. No respiratory distress.     Breath sounds: Normal breath sounds. No wheezing or rales.  Abdominal:     General: Bowel sounds are normal. There is no distension.     Palpations: Abdomen is soft. There is no mass.     Tenderness: There is no abdominal tenderness.  Musculoskeletal:        General: No tenderness.     Cervical back: Full passive range of motion without pain, normal range of motion and neck supple.     Comments: Joint and myofacial tenderness in shoulder girdle  Limited rom of left hip  Lymphadenopathy:     Cervical: No cervical adenopathy.  Skin:    General: Skin is warm and dry.     Coloration: Skin is not jaundiced or pale.     Findings: No bruising or rash.  Neurological:     Mental Status: She is alert and oriented to person, place, and time.     Cranial Nerves: No cranial nerve deficit.     Sensory:  No sensory deficit.     Motor: No tremor, atrophy or abnormal muscle tone.     Coordination: Coordination normal.     Gait: Gait normal.     Deep Tendon Reflexes: Reflexes are normal and symmetric.     Comments: No focal cerebellar signs   Psychiatric:        Behavior: Behavior normal.        Thought Content: Thought content normal.           Assessment & Plan:   Problem List Items Addressed This Visit       Cardiovascular and Mediastinum   Essential hypertension   bp in fair control at this time  BP Readings from Last 1 Encounters:  11/06/23 138/65   No changes needed Most recent labs reviewed  Disc lifstyle change with low sodium diet and exercise  No medications Labs today as planned         Other   Stress reaction   Prediabetes   Labs today as planned       Multiple joint pain   Joint /muscle and shoulder girdle pain  Worse off prednisone  Labs today as planned       Low serum vitamin B12   Labs today as planned       Hyperlipidemia, mild   Labs today as planned       Fatigue   Labs today as planned  Feels slightly better off prednisone  but still tires easily       Dizziness - Primary   Episode of spining dizziness when getting out of bed this am  Better now  Reassuring neuro exam Suspect brief episode of vertigo  No other neuro signs and symptoms -suspect peripheral  Encouraged pt to update if this re occurs/ER if worse  Encouraged good hydration and slow chang in position  Vitals stable   Labs today

## 2023-11-06 NOTE — Assessment & Plan Note (Addendum)
 Episode of spining dizziness when getting out of bed this am  Better now  Reassuring neuro exam Suspect brief episode of vertigo  No other neuro signs and symptoms -suspect peripheral  Encouraged pt to update if this re occurs/ER if worse  Encouraged good hydration and slow chang in position  Vitals stable   Labs today

## 2023-11-06 NOTE — Assessment & Plan Note (Signed)
 bp in fair control at this time  BP Readings from Last 1 Encounters:  11/06/23 138/65   No changes needed Most recent labs reviewed  Disc lifstyle change with low sodium diet and exercise  No medications Labs today as planned

## 2023-11-06 NOTE — Assessment & Plan Note (Signed)
 Labs today as planned

## 2023-11-06 NOTE — Assessment & Plan Note (Signed)
 Labs today as planned  Feels slightly better off prednisone  but still tires easily

## 2023-11-06 NOTE — Patient Instructions (Addendum)
 Continue current medicines   Make sure you eat and drink fluids regularly and take breaks if you are tired    Watch closely for signs/symptoms of stroke - call 911 if that occurs  If vertigo returns and does not improve or becomes severe also call 911   Get some fluids when you get home    Try and eat regularly   Labs today as planned

## 2023-11-06 NOTE — Assessment & Plan Note (Signed)
 Joint /muscle and shoulder girdle pain  Worse off prednisone  Labs today as planned

## 2023-11-07 ENCOUNTER — Other Ambulatory Visit

## 2023-11-07 ENCOUNTER — Ambulatory Visit

## 2023-11-07 ENCOUNTER — Encounter: Payer: Self-pay | Admitting: Family Medicine

## 2023-11-07 DIAGNOSIS — M2550 Pain in unspecified joint: Secondary | ICD-10-CM | POA: Diagnosis not present

## 2023-11-07 LAB — COMPREHENSIVE METABOLIC PANEL WITH GFR
ALT: 17 U/L (ref 0–35)
AST: 15 U/L (ref 0–37)
Albumin: 3.6 g/dL (ref 3.5–5.2)
Alkaline Phosphatase: 55 U/L (ref 39–117)
BUN: 10 mg/dL (ref 6–23)
CO2: 27 meq/L (ref 19–32)
Calcium: 9.6 mg/dL (ref 8.4–10.5)
Chloride: 108 meq/L (ref 96–112)
Creatinine, Ser: 0.8 mg/dL (ref 0.40–1.20)
GFR: 71.64 mL/min (ref >60.00)
Glucose, Bld: 99 mg/dL (ref 70–99)
Potassium: 4.2 meq/L (ref 3.5–5.1)
Sodium: 142 meq/L (ref 135–145)
Total Bilirubin: 0.5 mg/dL (ref 0.2–1.2)
Total Protein: 5.9 g/dL — ABNORMAL LOW (ref 6.0–8.3)

## 2023-11-07 LAB — CBC WITH DIFFERENTIAL/PLATELET
Basophils Absolute: 0.1 10*3/uL (ref 0.0–0.1)
Basophils Relative: 1 % (ref 0.0–3.0)
Eosinophils Absolute: 0.4 10*3/uL (ref 0.0–0.7)
Eosinophils Relative: 4.4 % (ref 0.0–5.0)
HCT: 36.2 % (ref 36.0–46.0)
Hemoglobin: 11.9 g/dL — ABNORMAL LOW (ref 12.0–15.0)
Lymphocytes Relative: 22 % (ref 12.0–46.0)
Lymphs Abs: 2 10*3/uL (ref 0.7–4.0)
MCHC: 32.9 g/dL (ref 30.0–36.0)
MCV: 89.9 fl (ref 78.0–100.0)
Monocytes Absolute: 1 10*3/uL (ref 0.1–1.0)
Monocytes Relative: 11.2 % (ref 3.0–12.0)
Neutro Abs: 5.5 10*3/uL (ref 1.4–7.7)
Neutrophils Relative %: 61.4 % (ref 43.0–77.0)
Platelets: 359 10*3/uL (ref 150.0–400.0)
RBC: 4.03 Mil/uL (ref 3.87–5.11)
RDW: 14.3 % (ref 11.5–15.5)
WBC: 9 10*3/uL (ref 4.0–10.5)

## 2023-11-07 LAB — LIPID PANEL
Cholesterol: 159 mg/dL (ref 0–200)
HDL: 54.8 mg/dL (ref >39.00)
LDL Cholesterol: 76 mg/dL (ref 0–99)
NonHDL: 104.42
Total CHOL/HDL Ratio: 3
Triglycerides: 140 mg/dL (ref 0.0–149.0)
VLDL: 28 mg/dL (ref 0.0–40.0)

## 2023-11-07 LAB — RHEUMATOID FACTOR: Rheumatoid fact SerPl-aCnc: 10 [IU]/mL (ref <14)

## 2023-11-07 LAB — IRON: Iron: 30 ug/dL — ABNORMAL LOW (ref 42–145)

## 2023-11-07 LAB — TSH: TSH: 1.66 u[IU]/mL (ref 0.35–5.50)

## 2023-11-07 LAB — HEMOGLOBIN A1C: Hgb A1c MFr Bld: 6.1 % (ref 4.6–6.5)

## 2023-11-07 LAB — T4, FREE: Free T4: 0.84 ng/dL (ref 0.60–1.60)

## 2023-11-07 LAB — ANA W/REFLEX: Anti Nuclear Antibody (ANA): NEGATIVE

## 2023-11-07 LAB — VITAMIN B12: Vitamin B-12: 226 pg/mL (ref 211–911)

## 2023-11-07 LAB — C-REACTIVE PROTEIN: CRP: 7.5 mg/dL (ref 0.5–20.0)

## 2023-11-07 LAB — VITAMIN D 25 HYDROXY (VIT D DEFICIENCY, FRACTURES): VITD: 18.63 ng/mL — ABNORMAL LOW (ref 30.00–100.00)

## 2023-11-07 LAB — SEDIMENTATION RATE: Sed Rate: 46 mm/h — ABNORMAL HIGH (ref 0–30)

## 2023-11-07 NOTE — Addendum Note (Signed)
 Addended by: Gerry Krone on: 11/07/2023 03:04 PM   Modules accepted: Orders

## 2023-11-14 ENCOUNTER — Ambulatory Visit: Admitting: Family Medicine

## 2023-11-15 ENCOUNTER — Ambulatory Visit (INDEPENDENT_AMBULATORY_CARE_PROVIDER_SITE_OTHER): Admitting: Family Medicine

## 2023-11-15 ENCOUNTER — Encounter: Payer: Self-pay | Admitting: Family Medicine

## 2023-11-15 VITALS — BP 146/80 | HR 97 | Temp 98.9°F | Ht 59.0 in | Wt 126.5 lb

## 2023-11-15 DIAGNOSIS — G43709 Chronic migraine without aura, not intractable, without status migrainosus: Secondary | ICD-10-CM | POA: Diagnosis not present

## 2023-11-15 DIAGNOSIS — R7303 Prediabetes: Secondary | ICD-10-CM

## 2023-11-15 DIAGNOSIS — R42 Dizziness and giddiness: Secondary | ICD-10-CM | POA: Diagnosis not present

## 2023-11-15 DIAGNOSIS — F430 Acute stress reaction: Secondary | ICD-10-CM

## 2023-11-15 DIAGNOSIS — M2550 Pain in unspecified joint: Secondary | ICD-10-CM

## 2023-11-15 DIAGNOSIS — E611 Iron deficiency: Secondary | ICD-10-CM

## 2023-11-15 DIAGNOSIS — E559 Vitamin D deficiency, unspecified: Secondary | ICD-10-CM | POA: Insufficient documentation

## 2023-11-15 DIAGNOSIS — I10 Essential (primary) hypertension: Secondary | ICD-10-CM

## 2023-11-15 MED ORDER — PREDNISONE 10 MG PO TABS
ORAL_TABLET | ORAL | 0 refills | Status: DC
Start: 2023-11-15 — End: 2024-03-04

## 2023-11-15 MED ORDER — KETOROLAC TROMETHAMINE 30 MG/ML IJ SOLN
30.0000 mg | Freq: Once | INTRAMUSCULAR | Status: AC
Start: 2023-11-15 — End: 2023-11-15
  Administered 2023-11-15: 30 mg via INTRAMUSCULAR

## 2023-11-15 NOTE — Progress Notes (Unsigned)
 Subjective:    Patient ID: Joann Buchanan, female    DOB: 06-Jun-1946, 78 y.o.   MRN: [id number]  HPI  Wt Readings from Last 3 Encounters:  11/15/23 126 lb 8 oz (57.4 kg)  11/06/23 128 lb 6.4 oz (58.2 kg)  10/31/23 126 lb 4 oz (57.3 kg)   25.55 kg/m  Vitals:   11/15/23 1344 11/15/23 1411  BP: (!) 162/80 (!) 146/80  Pulse: 97   Temp: 98.9 F (37.2 C)   SpO2: 98%      Pt presents for follow up of dizziness/labs  Headache today  Blood pressure has been high   Headache ever since then  Getting worse and worse  Back of her head   Headaches eased off for a while but returned after vertigo   No longer having dizziness  No stroke symptoms at all   Used to take imitrex  nasal spray   Supposed to have surgery next Friday- carpal tunnel    Pain all over  Hurts all over -like her skin hurts  Continues gabapentin  300 mg bid   Trazadone at bedtime   History of cervical spondylosis  Also post herpetic neuralgia  Also past migraine Also CTS Sees dr Lydia Sams    Labs last visit  Results for orders placed or performed in visit on 11/07/23  C-reactive protein   Collection Time: 11/07/23  3:57 PM  Result Value Ref Range   CRP 7.5 0.5 - 20.0 mg/dL    Lab Results  Component Value Date   ESRSEDRATE 46 (H) 11/06/2023    Lab Results  Component Value Date   NA 142 11/06/2023   K 4.2 11/06/2023   CO2 27 11/06/2023   GLUCOSE 99 11/06/2023   BUN 10 11/06/2023   CREATININE 0.80 11/06/2023   CALCIUM  9.6 11/06/2023   GFR 71.64 11/06/2023   GFRNONAA >60 03/05/2022   Lab Results  Component Value Date   ALT 17 11/06/2023   AST 15 11/06/2023   ALKPHOS 55 11/06/2023   BILITOT 0.5 11/06/2023   Lab Results  Component Value Date   WBC 9.0 11/06/2023   HGB 11.9 (L) 11/06/2023   HCT 36.2 11/06/2023   MCV 89.9 11/06/2023   PLT 359.0 11/06/2023   Lab Results  Component Value Date   IRON 30 (L) 11/06/2023   Last vitamin D  Lab Results  Component Value Date   VD25OH  18.63 (L) 11/06/2023    Lab Results  Component Value Date   HGBA1C 6.1 11/06/2023   Lab Results  Component Value Date   TSH 1.66 11/06/2023   Normal Ft4  Normal ana and RF  No severe stressor or trauma  Some worry-some family issues      Patient Active Problem List   Diagnosis Date Noted   Encounter for screening mammogram for breast cancer 10/30/2016    Priority: Low   Low iron 11/17/2023   Vitamin D  deficiency 11/15/2023   Dizziness 11/06/2023   Multiple joint pain 10/31/2023   Fatigue 10/31/2023   Post herpetic neuralgia 04/02/2023   Stress reaction 04/02/2023   History of TIA (transient ischemic attack) 02/20/2022   Insomnia 02/20/2022   Low serum vitamin B12 09/05/2021   Hyperlipidemia, mild 08/30/2021   Palpitations 08/30/2021   Dysphagia 08/25/2021   Grief reaction 11/30/2020   Prediabetes 05/09/2018   S/P cervical spinal fusion 12/02/2015   Osteopenia 10/25/2015   Estrogen deficiency 10/05/2015   Routine general medical examination at a health care facility 09/25/2015   Hand  tingling 04/05/2015   Osteoarthrosis, 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    HTN (hypertension)    Hx of fibrocystic disease of breast    Melanoma (HCC)    Migraine    Numbness and tingling    bilateral hands   Osteoarthritis of hand    PONV (postoperative nausea and vomiting)    Past Surgical History:  Procedure Laterality Date   ANTERIOR CERVICAL DECOMP/DISCECTOMY FUSION N/A 12/02/2015   Procedure: ANTERIOR CERVICAL DECOMPRESSION/DISCECTOMY FUSION CERVICAL FOUR-FIVE;  Surgeon: Isadora Mar, MD;  Location: [facility];  Service: Neurosurgery;  Laterality: N/A;   APPENDECTOMY      BREAST BIOPSY     normal   CATARACT EXTRACTION Right Jan 2017   CATARACT EXTRACTION W/ INTRAOCULAR LENS IMPLANT Left 2018   DEXA  7/07   negative   EMG     left upper arm- neg   ESOPHAGOGASTRODUODENOSCOPY     normal-24 hour PH probe (-)   EYE SURGERY  10/09   Left eye; for double vision   PARTIAL HYSTERECTOMY     Ovaries intact   TUBAL LIGATION     Social History   Tobacco Use   Smoking status: Never   Smokeless tobacco: Never  Substance Use Topics   Alcohol  use: No    Alcohol /week: 0.0 standard drinks of alcohol    Drug use: No   Family History  Problem Relation Age of Onset   Breast cancer Mother        Died, 6   Other Father        Died, 30 - tree fell on him   Breast cancer Sister        Died, 7   Heart disease Sister        Died, 79   Healthy Son    Healthy Daughter    Allergies  Allergen Reactions   Sulfonamide Derivatives Other (See Comments) and Hives   Current Outpatient Medications on File Prior to Visit  Medication Sig Dispense Refill   ALPRAZolam  (XANAX ) 0.25 MG tablet Take 0.25 mg by mouth daily as needed (for airplane flights).     cholecalciferol (VITAMIN D3) 25 MCG (1000 UNIT) tablet Take 4,000 Units by mouth daily.     fluticasone  (FLONASE ) 50 MCG/ACT nasal spray Place 2 sprays into both nostrils daily as needed for allergies or rhinitis. 16 mL 2   gabapentin  (NEURONTIN ) 100 MG capsule Take 1 capsule (100 mg total) by mouth daily. Mid day 90 capsule 1   gabapentin  (NEURONTIN ) 300 MG capsule Take 1 capsule (300 mg total) by mouth 2 (two) times daily. (Patient taking differently: Take 300 mg by mouth daily. One capsule in the morning) 180 capsule 1   Polyethyl Glycol-Propyl Glycol 0.4-0.3 % SOLN Place 1 drop into both eyes as needed.     traMADol  (ULTRAM ) 50 MG tablet Take 50 mg by mouth every 6 (six) hours.     traZODone  (DESYREL ) 50 MG tablet TAKE 1-2 TABLETS (50-100 MG TOTAL) BY MOUTH AT BEDTIME AS NEEDED. FOR SLEEP 60 tablet 11   No current  facility-administered medications on file prior to visit.    Review of Systems  Constitutional:  Positive for fatigue. Negative for activity change, appetite change, fever and unexpected weight change.  HENT:  Negative for congestion, ear pain, rhinorrhea, sinus pressure and sore throat.   Eyes:  Negative for pain, redness and visual disturbance.  Respiratory:  Negative for cough, shortness of breath and wheezing.   Cardiovascular:  Negative for chest pain and palpitations.  Gastrointestinal:  Negative for abdominal pain, blood in stool, constipation and diarrhea.  Endocrine: Negative for polydipsia and polyuria.  Genitourinary:  Negative for dysuria, frequency and urgency.  Musculoskeletal:  Positive for arthralgias, back pain and myalgias. Negative for gait problem and joint swelling.  Skin:  Negative for pallor and rash.  Allergic/Immunologic: Negative for environmental allergies.  Neurological:  Positive for headaches. Negative for dizziness, tremors, seizures, syncope, facial asymmetry, speech difficulty, weakness, light-headedness and numbness.  Hematological:  Negative for adenopathy. Does not bruise/bleed easily.  Psychiatric/Behavioral:  Negative for decreased concentration and dysphoric mood. The patient is not nervous/anxious.        Objective:   Physical Exam Constitutional:      General: She is not in acute distress.    Appearance: Normal appearance. She is well-developed and normal weight. She is not ill-appearing or diaphoretic.     Comments: Headache today but not in distress  HENT:     Head: Normocephalic and atraumatic.     Comments: No facial or head tenderness    Right Ear: Tympanic membrane, ear canal and external ear normal.     Left Ear: Tympanic membrane, ear canal and external ear normal.     Nose: Nose normal.     Mouth/Throat:     Pharynx: No oropharyngeal exudate.  Eyes:     General: No visual field deficit or scleral icterus.       Right eye: No  discharge.        Left eye: No discharge.     Conjunctiva/sclera: Conjunctivae normal.     Pupils: Pupils are equal, round, and reactive to light.     Comments: No nystagmus  Neck:     Thyroid : No thyromegaly.     Vascular: No carotid bruit or JVD.     Trachea: No tracheal deviation.  Cardiovascular:     Rate and Rhythm: Normal rate and regular rhythm.     Heart sounds: Normal heart sounds. No murmur heard.    No gallop.  Pulmonary:     Effort: Pulmonary effort is normal. No respiratory distress.     Breath sounds: Normal breath sounds. No wheezing or rales.  Abdominal:     General: Bowel sounds are normal. There is no distension or abdominal bruit.     Palpations: Abdomen is soft. There is no mass.     Tenderness: There is no abdominal tenderness.  Musculoskeletal:        General: No tenderness.     Cervical back: Full passive range of motion without pain, normal range of motion and neck supple.     Right lower leg: No edema.     Left lower leg: No edema.  Lymphadenopathy:     Cervical: No cervical adenopathy.  Skin:    General: Skin is warm and dry.     Coloration: Skin is not pale.     Findings: No rash.  Neurological:     Mental Status: She is alert and oriented to person, place, and time.     Cranial Nerves: No cranial nerve deficit, dysarthria or facial asymmetry.     Sensory: Sensation is  intact. No sensory deficit.     Motor: No weakness, tremor, atrophy, abnormal muscle tone, seizure activity or pronator drift.     Coordination: Romberg sign negative. Coordination normal. Finger-Nose-Finger Test normal.     Gait: Gait normal.     Deep Tendon Reflexes: Reflexes are normal and symmetric. Reflexes normal.     Comments: No focal cerebellar signs   Psychiatric:        Attention and Perception: Attention normal.        Mood and Affect: Mood is anxious. Affect is tearful.        Speech: Speech normal.        Behavior: Behavior normal.        Thought Content: Thought  content normal.        Cognition and Memory: Cognition and memory normal.     Comments: Anxious and also in discomfort  Tearful at times   Candidly discusses symptoms and stressors             Assessment & Plan:   Problem List Items Addressed This Visit       Cardiovascular and Mediastinum   Migraine - Primary   Pt has a flare/ current headache on/off since last visit  No changes on exam but blood pressure is up  Also c/o all over body pain - labs reviewed from last time   In distant past thinks she may have used imitrex  ns   Today to break headache cycle-prescription 30 mg pred taper and given a one time shot of toradol  30 mg  Discussed prednisone  side effects/ pt is aware and has exp anxiety from it in the past  Update if not starting to improve in a week or if worsening  Call back and Er precautions noted in detail today   Plan follow up next week   (Has carpal tunnel surgery planned in a month and may have to re schedule) Does continue gabapentin  300 mg bid       Essential hypertension   Blood pressure is up today but pt also has headache/discomfort  ? If blood pressure causes headache or vice vesa   BP: (!) 146/80  No medications currently but would consider if still elevated next time         Other   Vitamin D  deficiency   Last vitamin D  Lab Results  Component Value Date   VD25OH 18.63 (L) 11/06/2023   Instructed to start vit D3 4000 international units daily for bone and overall health      Stress reaction   Some family issues  Own health-experiencing widespread pain right now  Anxiety was worse on prednisone    Counseling may be helpful  Takes trazodone  for sleep  May benefit from anxiety treatment in future       Prediabetes   Lab Results  Component Value Date   HGBA1C 6.1 11/06/2023   HGBA1C 5.1 02/20/2022   HGBA1C 5.4 08/25/2021   disc imp of low glycemic diet and wt loss to prevent DM2         Multiple joint pain    Body/joint/muscles  Reassuring labs last time  Borderline ESR but normal CRP Other labs including auto immune joint labs are normal  No change in exam today   Discussed possibility of myofascial pain disorder and will discuss at follow up more next week   Call back and Er precautions noted in detail today   ? If low dose prednisone  will help at all  Low iron   Lab Results  Component Value Date   IRON 30 (L) 11/06/2023   Hb of 11.9   Noted on last labs  Will discuss further at follow up   No recent colonoscopy  Cologuard neg in 2023         Dizziness   This vertiginous episode is now resolved Has a persistent headache No change in exam

## 2023-11-15 NOTE — Patient Instructions (Addendum)
 I need you to get vitamin D3 over the counter 4000 international units daily   Take it every day   Drink lots of fluids   Today Toradol  injection for headache  Also prednisone  taper 30 mg (may make you feel hyper/anxious/hungry)    Reach out to your hand surgeon -tell them you have a bad migraine headache and that we had to start a prednisone  taper today and also one dose of toradol   They may or may not need to put off sugery   Follow up here here next week

## 2023-11-17 DIAGNOSIS — E611 Iron deficiency: Secondary | ICD-10-CM | POA: Insufficient documentation

## 2023-11-17 NOTE — Assessment & Plan Note (Signed)
 Blood pressure is up today but pt also has headache/discomfort  ? If blood pressure causes headache or vice vesa   BP: (!) 146/80  No medications currently but would consider if still elevated next time

## 2023-11-17 NOTE — Assessment & Plan Note (Addendum)
 Some family issues  Own health-experiencing widespread pain right now  Anxiety was worse on prednisone    Counseling may be helpful  Takes trazodone  for sleep  May benefit from anxiety treatment in future

## 2023-11-17 NOTE — Assessment & Plan Note (Signed)
 Body/joint/muscles  Reassuring labs last time  Borderline ESR but normal CRP Other labs including auto immune joint labs are normal  No change in exam today   Discussed possibility of myofascial pain disorder and will discuss at follow up more next week   Call back and Er precautions noted in detail today   ? If low dose prednisone  will help at all

## 2023-11-17 NOTE — Assessment & Plan Note (Signed)
 Last vitamin D  Lab Results  Component Value Date   VD25OH 18.63 (L) 11/06/2023   Instructed to start vit D3 4000 international units daily for bone and overall health

## 2023-11-17 NOTE — Assessment & Plan Note (Signed)
 This vertiginous episode is now resolved Has a persistent headache No change in exam

## 2023-11-17 NOTE — Assessment & Plan Note (Signed)
 Lab Results  Component Value Date   IRON 30 (L) 11/06/2023   Hb of 11.9   Noted on last labs  Will discuss further at follow up   No recent colonoscopy  Cologuard neg in 2023

## 2023-11-17 NOTE — Assessment & Plan Note (Signed)
 Lab Results  Component Value Date   HGBA1C 6.1 11/06/2023   HGBA1C 5.1 02/20/2022   HGBA1C 5.4 08/25/2021   disc imp of low glycemic diet and wt loss to prevent DM2

## 2023-11-17 NOTE — Assessment & Plan Note (Signed)
 Pt has a flare/ current headache on/off since last visit  No changes on exam but blood pressure is up  Also c/o all over body pain - labs reviewed from last time   In distant past thinks she may have used imitrex  ns   Today to break headache cycle-prescription 30 mg pred taper and given a one time shot of toradol  30 mg  Discussed prednisone  side effects/ pt is aware and has exp anxiety from it in the past  Update if not starting to improve in a week or if worsening  Call back and Er precautions noted in detail today   Plan follow up next week   (Has carpal tunnel surgery planned in a month and may have to re schedule) Does continue gabapentin  300 mg bid

## 2023-11-20 ENCOUNTER — Encounter: Payer: Self-pay | Admitting: Family Medicine

## 2023-11-20 ENCOUNTER — Ambulatory Visit (INDEPENDENT_AMBULATORY_CARE_PROVIDER_SITE_OTHER): Admitting: Family Medicine

## 2023-11-20 VITALS — BP 130/70 | HR 93 | Temp 98.2°F | Ht 59.0 in | Wt 124.0 lb

## 2023-11-20 DIAGNOSIS — I10 Essential (primary) hypertension: Secondary | ICD-10-CM

## 2023-11-20 DIAGNOSIS — G5601 Carpal tunnel syndrome, right upper limb: Secondary | ICD-10-CM

## 2023-11-20 DIAGNOSIS — E559 Vitamin D deficiency, unspecified: Secondary | ICD-10-CM

## 2023-11-20 DIAGNOSIS — M2550 Pain in unspecified joint: Secondary | ICD-10-CM

## 2023-11-20 DIAGNOSIS — G43709 Chronic migraine without aura, not intractable, without status migrainosus: Secondary | ICD-10-CM | POA: Diagnosis not present

## 2023-11-20 DIAGNOSIS — E611 Iron deficiency: Secondary | ICD-10-CM

## 2023-11-20 DIAGNOSIS — F430 Acute stress reaction: Secondary | ICD-10-CM

## 2023-11-20 NOTE — Assessment & Plan Note (Signed)
 Per pt -family stressors are less  A little jittery from prednisone  Has trazodone  for sleep prn  Takes gabapentin  for nerve pain also   Will continue to watch  May benefit from counseling in future

## 2023-11-20 NOTE — Patient Instructions (Addendum)
 Continue gabapentin   Take trazodone  for sleep as needed   Finish prednisone  taper - anxiety will improve when done with it   So glad the headache is better   If headache returns we will need to to get back to neurology earlier than planned (and let us  know)    Get your carpal tunnel surgery    I put the referral in for GI to evaluate new iron deficiency  Please let us  know if you don't hear in 1-2 weeks to set that up  Please keep us  posted

## 2023-11-20 NOTE — Progress Notes (Signed)
 Subjective:    Patient ID: Joann Buchanan, female    DOB: 06/27/1946, 78 y.o.   MRN: [id number]  HPI  Wt Readings from Last 3 Encounters:  11/20/23 124 lb (56.2 kg)  11/15/23 126 lb 8 oz (57.4 kg)  11/06/23 128 lb 6.4 oz (58.2 kg)   25.04 kg/m  Vitals:   11/20/23 1107  BP: 130/70  Pulse: 93  Temp: 98.2 F (36.8 C)  SpO2: 97%   Pt presents for follow up of  Headache Joint pain  Anxiety  Low iron    Last visit prescription low dose prednisone   (30 mg taper) for acute headache and also given toradol  IM   Today overall improved Still some left sided head pressure   Sees Dr Lydia Sams for neurology  Next follow up if sept    Feels like whole body is jerking on the inside -anxiety  Thinks the gabapentin  helps Is tolerating that  Also has trazodone  (takes prn)    Called surgeon -able to get the carpal tunnel surgery tomorrow   Joints still hurt Shoulders  Ankle     Lab Results  Component Value Date   ANA Negative 11/06/2023   RF 10 11/06/2023      Blood pressure is better today BP Readings from Last 3 Encounters:  11/20/23 130/70  11/15/23 (!) 146/80  11/06/23 138/65    Lab Results  Component Value Date   NA 142 11/06/2023   K 4.2 11/06/2023   CO2 27 11/06/2023   GLUCOSE 99 11/06/2023   BUN 10 11/06/2023   CREATININE 0.80 11/06/2023   CALCIUM  9.6 11/06/2023   GFR 71.64 11/06/2023   GFRNONAA >60 03/05/2022     Lab Results  Component Value Date   WBC 9.0 11/06/2023   HGB 11.9 (L) 11/06/2023   HCT 36.2 11/06/2023   MCV 89.9 11/06/2023   PLT 359.0 11/06/2023   Lab Results  Component Value Date   IRON 30 (L) 11/06/2023   Cologuard neg 2023   No history of iron def No abd symptoms or blood in stool  Does not donate blood  No recent blood loss from surgery    Family stress is improved  Trying to deal with it now  Lost husband in 2022     Patient Active Problem List   Diagnosis Date Noted   Encounter for screening mammogram  for breast cancer 10/30/2016    Priority: Low   Low iron 11/17/2023   Vitamin D  deficiency 11/15/2023   Multiple joint pain 10/31/2023   Fatigue 10/31/2023   Post herpetic neuralgia 04/02/2023   Stress reaction 04/02/2023   History of TIA (transient ischemic attack) 02/20/2022   Insomnia 02/20/2022   Low serum vitamin B12 09/05/2021   Hyperlipidemia, mild 08/30/2021   Palpitations 08/30/2021   Dysphagia 08/25/2021   Grief reaction 11/30/2020   Prediabetes 05/09/2018   S/P cervical spinal fusion 12/02/2015   Osteopenia 10/25/2015   Estrogen deficiency 10/05/2015   Routine general medical examination at a health care facility 09/25/2015   Carpal tunnel syndrome 04/05/2015   Osteoarthrosis, 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    HTN (hypertension)    Hx of  fibrocystic disease of breast    Melanoma (HCC)    Migraine    Numbness and tingling    bilateral hands   Osteoarthritis of hand    PONV (postoperative nausea and vomiting)    Past Surgical History:  Procedure Laterality Date   ANTERIOR CERVICAL DECOMP/DISCECTOMY FUSION N/A 12/02/2015   Procedure: ANTERIOR CERVICAL DECOMPRESSION/DISCECTOMY FUSION CERVICAL FOUR-FIVE;  Surgeon: Isadora Mar, MD;  Location: [facility];  Service: Neurosurgery;  Laterality: N/A;   APPENDECTOMY     BREAST BIOPSY     normal   CATARACT EXTRACTION Right Jan 2017   CATARACT EXTRACTION W/ INTRAOCULAR LENS IMPLANT Left 2018   DEXA  7/07   negative   EMG     left upper arm- neg   ESOPHAGOGASTRODUODENOSCOPY     normal-24 hour PH probe (-)   EYE SURGERY  10/09   Left eye; for double vision   PARTIAL HYSTERECTOMY     Ovaries intact   TUBAL LIGATION      Social History   Tobacco Use   Smoking status: Never   Smokeless tobacco: Never  Substance Use Topics   Alcohol  use: No    Alcohol /week: 0.0 standard drinks of alcohol    Drug use: No   Family History  Problem Relation Age of Onset   Breast cancer Mother        Died, 57   Other Father        Died, 5 - tree fell on him   Breast cancer Sister        Died, 83   Heart disease Sister        Died, 62   Healthy Son    Healthy Daughter    Allergies  Allergen Reactions   Sulfonamide Derivatives Other (See Comments) and Hives   Current Outpatient Medications on File Prior to Visit  Medication Sig Dispense Refill   ALPRAZolam  (XANAX ) 0.25 MG tablet Take 0.25 mg by mouth daily as needed (for airplane flights).     cholecalciferol (VITAMIN D3) 25 MCG (1000 UNIT) tablet Take 4,000 Units by mouth daily.     fluticasone  (FLONASE ) 50 MCG/ACT nasal spray Place 2 sprays into both nostrils daily as needed for allergies or rhinitis. 16 mL 2   gabapentin  (NEURONTIN ) 100 MG capsule Take 1 capsule (100 mg total) by mouth daily. Mid day 90 capsule 1   gabapentin  (NEURONTIN ) 300 MG capsule Take 1 capsule (300 mg total) by mouth 2 (two) times daily. (Patient taking differently: Take 300 mg by mouth daily. One capsule in the morning) 180 capsule 1   Polyethyl Glycol-Propyl Glycol 0.4-0.3 % SOLN Place 1 drop into both eyes as needed.     predniSONE  (DELTASONE ) 10 MG tablet Take 3 pills once daily by mouth for 3 days, then 2 pills once daily for 3 days, then 1 pill once daily for 3 days and then stop 18 tablet 0   traMADol  (ULTRAM ) 50 MG tablet Take 50 mg by mouth every 6 (six) hours.     traZODone  (DESYREL ) 50 MG tablet TAKE 1-2 TABLETS (50-100 MG TOTAL) BY MOUTH AT BEDTIME AS NEEDED. FOR SLEEP 60 tablet 11   No current facility-administered medications on file prior to visit.    Review of Systems  Constitutional:  Positive for fatigue. Negative for activity change, appetite change, fever and  unexpected weight change.  HENT:  Negative for congestion, ear pain, rhinorrhea, sinus pressure and sore throat.   Eyes:  Negative for pain, redness and  visual disturbance.  Respiratory:  Negative for cough, shortness of breath and wheezing.   Cardiovascular:  Negative for chest pain and palpitations.  Gastrointestinal:  Negative for abdominal pain, blood in stool, constipation and diarrhea.  Endocrine: Negative for polydipsia and polyuria.  Genitourinary:  Negative for dysuria, frequency and urgency.  Musculoskeletal:  Positive for arthralgias, back pain, myalgias and neck pain.  Skin:  Negative for pallor and rash.  Allergic/Immunologic: Negative for environmental allergies.  Neurological:  Positive for numbness. Negative for dizziness, syncope and headaches.  Hematological:  Negative for adenopathy. Does not bruise/bleed easily.  Psychiatric/Behavioral:  Negative for decreased concentration and dysphoric mood. The patient is nervous/anxious.        Objective:   Physical Exam Constitutional:      Appearance: Normal appearance. She is normal weight. She is not ill-appearing.  HENT:     Head: Normocephalic and atraumatic.  Eyes:     General:        Right eye: No discharge.        Left eye: No discharge.     Conjunctiva/sclera: Conjunctivae normal.     Pupils: Pupils are equal, round, and reactive to light.  Cardiovascular:     Rate and Rhythm: Tachycardia present.  Pulmonary:     Effort: Pulmonary effort is normal. No respiratory distress.  Abdominal:     General: Abdomen is flat. Bowel sounds are normal. There is no distension.     Palpations: There is no mass.     Tenderness: There is no abdominal tenderness.  Skin:    General: Skin is warm.     Coloration: Skin is not jaundiced or pale.     Findings: No bruising, erythema or rash.  Neurological:     Mental Status: She is alert.     Motor: No weakness.     Coordination: Coordination normal.  Psychiatric:         Attention and Perception: Attention normal.        Mood and Affect: Mood is anxious.        Cognition and Memory: Cognition and memory normal.     Comments: Anxious Not tearful Candidly discusses symptoms and stressors             Assessment & Plan:   Problem List Items Addressed This Visit       Cardiovascular and Mediastinum   Migraine - Primary   Improved / resolved (slight pressure remains) after shot of toradol  and 30 mg taper of prednisone    If this returns will plan early follow up with neurology         Essential hypertension   Much improved today with resolution of migraine BP: 130/70  No medications for this currently / will continue to watch         Nervous and Auditory   Carpal tunnel syndrome   For surgery tomorrow         Other   Vitamin D  deficiency   Now taking vitamin D       Stress reaction   Per pt -family stressors are less  A little jittery from prednisone  Has trazodone  for sleep prn  Takes gabapentin  for nerve pain also   Will continue to watch  May benefit from counseling in future       Multiple joint pain   Neg ANA  / RF Neg CRP Slightly elevated esr   ? If oa  Also ? If possible myofascial pain syndrome   Will continue to monitor  Low iron   This is new Lab Results  Component Value Date   IRON 30 (L) 11/06/2023   Has never had a colonoscopy  Hb of 11.9   Referral made to GI  No gi symptoms  No blood in stool       Relevant Orders   Ambulatory referral to Gastroenterology

## 2023-11-20 NOTE — Assessment & Plan Note (Signed)
 Much improved today with resolution of migraine BP: 130/70  No medications for this currently / will continue to watch

## 2023-11-20 NOTE — Assessment & Plan Note (Signed)
 Improved / resolved (slight pressure remains) after shot of toradol  and 30 mg taper of prednisone    If this returns will plan early follow up with neurology

## 2023-11-20 NOTE — Assessment & Plan Note (Signed)
 This is new Lab Results  Component Value Date   IRON 30 (L) 11/06/2023   Has never had a colonoscopy  Hb of 11.9   Referral made to GI  No gi symptoms  No blood in stool

## 2023-11-20 NOTE — Assessment & Plan Note (Signed)
For surgery tomorrow.

## 2023-11-20 NOTE — Assessment & Plan Note (Addendum)
 Neg ANA  / RF Neg CRP Slightly elevated esr   ? If oa  Also ? If possible myofascial pain syndrome   Will continue to monitor

## 2023-11-20 NOTE — Assessment & Plan Note (Signed)
Now taking vitamin D

## 2023-11-21 ENCOUNTER — Ambulatory Visit: Admitting: Family Medicine

## 2023-12-07 ENCOUNTER — Other Ambulatory Visit: Payer: Self-pay | Admitting: Family Medicine

## 2023-12-09 NOTE — Telephone Encounter (Signed)
 Last filled on 04/02/23 #180 caps/ 1 refill Last OV was a f/u on 11/20/23

## 2024-02-06 ENCOUNTER — Other Ambulatory Visit

## 2024-02-06 ENCOUNTER — Encounter: Payer: Self-pay | Admitting: Gastroenterology

## 2024-02-06 ENCOUNTER — Ambulatory Visit: Payer: Self-pay | Admitting: Gastroenterology

## 2024-02-06 ENCOUNTER — Ambulatory Visit (INDEPENDENT_AMBULATORY_CARE_PROVIDER_SITE_OTHER): Admitting: Gastroenterology

## 2024-02-06 VITALS — BP 122/68 | HR 96 | Ht 59.0 in | Wt 128.0 lb

## 2024-02-06 DIAGNOSIS — K219 Gastro-esophageal reflux disease without esophagitis: Secondary | ICD-10-CM

## 2024-02-06 DIAGNOSIS — D509 Iron deficiency anemia, unspecified: Secondary | ICD-10-CM

## 2024-02-06 DIAGNOSIS — R1310 Dysphagia, unspecified: Secondary | ICD-10-CM

## 2024-02-06 DIAGNOSIS — R1319 Other dysphagia: Secondary | ICD-10-CM

## 2024-02-06 LAB — CBC WITH DIFFERENTIAL/PLATELET
Basophils Absolute: 0.1 K/uL (ref 0.0–0.1)
Basophils Relative: 0.9 % (ref 0.0–3.0)
Eosinophils Absolute: 0.1 K/uL (ref 0.0–0.7)
Eosinophils Relative: 1.2 % (ref 0.0–5.0)
HCT: 39 % (ref 36.0–46.0)
Hemoglobin: 12.6 g/dL (ref 12.0–15.0)
Lymphocytes Relative: 17.3 % (ref 12.0–46.0)
Lymphs Abs: 2 K/uL (ref 0.7–4.0)
MCHC: 32.3 g/dL (ref 30.0–36.0)
MCV: 86.3 fl (ref 78.0–100.0)
Monocytes Absolute: 1.3 K/uL — ABNORMAL HIGH (ref 0.1–1.0)
Monocytes Relative: 11.6 % (ref 3.0–12.0)
Neutro Abs: 7.9 K/uL — ABNORMAL HIGH (ref 1.4–7.7)
Neutrophils Relative %: 69 % (ref 43.0–77.0)
Platelets: 415 K/uL — ABNORMAL HIGH (ref 150.0–400.0)
RBC: 4.52 Mil/uL (ref 3.87–5.11)
RDW: 18.8 % — ABNORMAL HIGH (ref 11.5–15.5)
WBC: 11.5 K/uL — ABNORMAL HIGH (ref 4.0–10.5)

## 2024-02-06 LAB — IBC + FERRITIN
Ferritin: 148.4 ng/mL (ref 10.0–291.0)
Iron: 20 ug/dL — ABNORMAL LOW (ref 42–145)
Saturation Ratios: 6.4 % — ABNORMAL LOW (ref 20.0–50.0)
TIBC: 313.6 ug/dL (ref 250.0–450.0)
Transferrin: 224 mg/dL (ref 212.0–360.0)

## 2024-02-06 MED ORDER — OMEPRAZOLE 40 MG PO CPDR
40.0000 mg | DELAYED_RELEASE_CAPSULE | Freq: Every day | ORAL | 3 refills | Status: DC
Start: 2024-02-06 — End: 2024-06-03

## 2024-02-06 MED ORDER — FERROUS GLUCONATE 324 (38 FE) MG PO TABS
324.0000 mg | ORAL_TABLET | Freq: Every day | ORAL | 2 refills | Status: DC
Start: 2024-02-06 — End: 2024-06-03

## 2024-02-06 NOTE — Progress Notes (Signed)
 Chief Complaint:low iron Primary GI Doctor:Dr. Federico  HPI:  Patient is a  78  year old female patient with past medical history of history of TIA 2023 (on Plavix  x 20d), HTN, hyperlipidemia, who was referred to me by Randeen Laine LABOR, MD on 11/20/23 for a evaluation of low iron.     Interval History Patient presents for evaluation of iron deficiency anemia and mild anemia discovered in April with lab work.  Patient not currently on iron supplementation.  She has never had colonoscopy, reports she has completed Cologuard few times in the past and always negative. EGD several years ago with Dr.Patterson for dysphagia. Patient denies nausea, vomiting, or weight loss. Not vegan. Not donating blood.  Patient has had some dizziness, lightheadedness, and fatigue.  No blood thinners.    Patient recently seen by orthopedic doctor for arthritis and carpel tunnel. She was prescribed Meloxicam and was taking daily, out of prescription. Took for two weeks. She is currently taking OTC ibuprofen  400 mg po daily, few times a week.   Patient also complains of esophageal dysphagia with solids.  Patient reports this has been going on for quite some time but has recently increased in severity and frequency. She complains of hoarseness and reports her throat feels raw. She also has had some regurgitation. Patient has history of GERD and currently only takes OTC Tums prn.  Patient denies altered bowel habits, abdominal pain, or rectal bleeding.  No alcohol  use. Nonsmoker.   Surgical history: hysterectomy, carpel tunnel surgery  Patient's family history includes: Breast CA in mother and sister  Wt Readings from Last 3 Encounters:  02/06/24 128 lb (58.1 kg)  11/20/23 124 lb (56.2 kg)  11/15/23 126 lb 8 oz (57.4 kg)     Past Medical History:  Diagnosis Date   Allergic rhinitis    GERD (gastroesophageal reflux disease)    Goiter    ?    HTN (hypertension)    Hx of fibrocystic disease of breast     Melanoma (HCC)    Migraine    Numbness and tingling    bilateral hands   Osteoarthritis of hand    PONV (postoperative nausea and vomiting)     Past Surgical History:  Procedure Laterality Date   ANTERIOR CERVICAL DECOMP/DISCECTOMY FUSION N/A 12/02/2015   Procedure: ANTERIOR CERVICAL DECOMPRESSION/DISCECTOMY FUSION CERVICAL FOUR-FIVE;  Surgeon: Alm GORMAN Molt, MD;  Location: [facility];  Service: Neurosurgery;  Laterality: N/A;   APPENDECTOMY     BREAST BIOPSY     normal   CATARACT EXTRACTION Right Jan 2017   CATARACT EXTRACTION W/ INTRAOCULAR LENS IMPLANT Left 2018   DEXA  7/07   negative   EMG     left upper arm- neg   ESOPHAGOGASTRODUODENOSCOPY     normal-24 hour PH probe (-)   EYE SURGERY  10/09   Left eye; for double vision   PARTIAL HYSTERECTOMY     Ovaries intact   TUBAL LIGATION      Current Outpatient Medications  Medication Sig Dispense Refill   gabapentin  (NEURONTIN ) 300 MG capsule TAKE 1 CAPSULE BY MOUTH TWICE A DAY 180 capsule 1   traZODone  (DESYREL ) 50 MG tablet TAKE 1-2 TABLETS (50-100 MG TOTAL) BY MOUTH AT BEDTIME AS NEEDED. FOR SLEEP 60 tablet 11   ALPRAZolam  (XANAX ) 0.25 MG tablet Take 0.25 mg by mouth daily as needed (for airplane flights). (Patient not taking: Reported on 02/06/2024)     cholecalciferol (VITAMIN D3) 25 MCG (1000 UNIT)  tablet Take 4,000 Units by mouth daily. (Patient not taking: Reported on 02/06/2024)     fluticasone  (FLONASE ) 50 MCG/ACT nasal spray Place 2 sprays into both nostrils daily as needed for allergies or rhinitis. (Patient not taking: Reported on 02/06/2024) 16 mL 2   gabapentin  (NEURONTIN ) 100 MG capsule Take 1 capsule (100 mg total) by mouth daily. Mid day (Patient not taking: Reported on 02/06/2024) 90 capsule 1   Polyethyl Glycol-Propyl Glycol 0.4-0.3 % SOLN Place 1 drop into both eyes as needed. (Patient not taking: Reported on 02/06/2024)     predniSONE  (DELTASONE ) 10 MG tablet Take 3 pills once daily by mouth for 3 days, then 2  pills once daily for 3 days, then 1 pill once daily for 3 days and then stop (Patient not taking: Reported on 02/06/2024) 18 tablet 0   traMADol  (ULTRAM ) 50 MG tablet Take 50 mg by mouth every 6 (six) hours. (Patient not taking: Reported on 02/06/2024)     No current facility-administered medications for this visit.    Allergies as of 02/06/2024 - Review Complete 02/06/2024  Allergen Reaction Noted   Sulfonamide derivatives Other (See Comments) and Hives 11/06/2023    Family History  Problem Relation Age of Onset   Breast cancer Mother        Died, 33   Other Father        Died, 66 - tree fell on him   Breast cancer Sister        Died, 23   Heart disease Sister        Died, 65   Healthy Son    Healthy Daughter     Review of Systems:    Constitutional: No weight loss, fever, chills, weakness or fatigue HEENT: Eyes: No change in vision               Ears, Nose, Throat:  No change in hearing or congestion Skin: No rash or itching Cardiovascular: No chest pain, chest pressure or palpitations   Respiratory: No SOB or cough Gastrointestinal: See HPI and otherwise negative Genitourinary: No dysuria or change in urinary frequency Neurological: No headache, dizziness or syncope Musculoskeletal: No new muscle or joint pain Hematologic: No bleeding or bruising Psychiatric: No history of depression or anxiety    Physical Exam:  Vital signs: BP 122/68   Pulse 96   Ht 4' 11 (1.499 m)   Wt 128 lb (58.1 kg)   BMI 25.85 kg/m   Constitutional:   Pleasant female appears to be in NAD, Well developed, Well nourished, alert and cooperative Throat: Oral cavity and pharynx without inflammation, swelling or lesion.  Respiratory: Respirations even and unlabored. Lungs clear to auscultation bilaterally.   No wheezes, crackles, or rhonchi.  Cardiovascular: Normal S1, S2. Regular rate and rhythm. No peripheral edema, cyanosis or pallor.  Gastrointestinal:  Soft, nondistended, nontender. No  rebound or guarding. Normal bowel sounds. No appreciable masses or hepatomegaly. Rectal:  Not performed.  Msk:  Symmetrical without gross deformities. Without edema, no deformity or joint abnormality.  Neurologic:  Alert and  oriented x4;  grossly normal neurologically.  Skin:   Dry and intact without significant lesions or rashes.  RELEVANT LABS AND IMAGING: CBC    Latest Ref Rng & Units 11/06/2023    3:32 PM 03/05/2022    1:13 PM 02/20/2022   12:35 PM  CBC  WBC 4.0 - 10.5 K/uL 9.0  7.9  5.8   Hemoglobin 12.0 - 15.0 g/dL 88.0  87.1  12.3  Hematocrit 36.0 - 46.0 % 36.2  39.3  37.5   Platelets 150.0 - 400.0 K/uL 359.0  320  385      CMP     Latest Ref Rng & Units 11/06/2023    3:32 PM 03/05/2022    1:13 PM 02/20/2022   12:35 PM  CMP  Glucose 70 - 99 mg/dL 99  897  889   BUN 6 - 23 mg/dL 10  17  14    Creatinine 0.40 - 1.20 mg/dL 9.19  9.11  9.09   Sodium 135 - 145 mEq/L 142  140  138   Potassium 3.5 - 5.1 mEq/L 4.2  3.1  4.3   Chloride 96 - 112 mEq/L 108  109  108   CO2 19 - 32 mEq/L 27  25  26    Calcium  8.4 - 10.5 mg/dL 9.6  89.8  9.5   Total Protein 6.0 - 8.3 g/dL 5.9  6.9  6.2   Total Bilirubin 0.2 - 1.2 mg/dL 0.5  0.5  0.6   Alkaline Phos 39 - 117 U/L 55  57  58   AST 0 - 37 U/L 15  19  16    ALT 0 - 35 U/L 17  15  11       Lab Results  Component Value Date   TSH 1.66 11/06/2023    Lab Results  Component Value Date   IRON 30 (L) 11/06/2023   Lab Results  Component Value Date   VITAMINB12 226 11/06/2023   11/07/23 labs show: CRP  7.5 11/06/23 labs show: vitamin D  18.63, TSH 1.66, Normal LFT's, BUN 10, creat 0.80 02/2022 echo- Left ventricular ejection fraction, by estimation, is 55 to 60%.  04/07/2013 EGD with Dr. Jakie The duodenal mucosa showed no abnormalities in the bulb and second portion of the duodenum The mucosa of the stomach appeared normal. 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    Gastroesophageal reflux disease, unspecified whether esophagitis present Yes   Esophageal dysphagia    Iron deficiency anemia, unspecified iron deficiency anemia type   Healthy 78 year old female patient with mild anemia >> Hgb 11.9 and iron deficiency anemia.  Patient currently not on any iron supplementation.  Last checked in April we will go ahead and recheck CBC and iron levels today.  Will place patient on supplementation if still low.  Patient denies overt bleeding.  Patient has never had colonoscopy.  Patient has had negative Cologuard in the past. Patient has had EGD back in 2014 for chest pain, heartburn and dysphagia that required dilatation.  Patient does report she has been taking NSAIDs regularly for arthritis.  Will go ahead and start patient on omeprazole  40 mg p.o. daily.  Recommend she discontinue all NSAIDs due to risk of ulcers.  Will go ahead and schedule upper GI endoscopy and colonoscopy to evaluate both dysphagia and iron deficiency anemia.  With endoscopy can provide dilatation if stricture noted on exam.  Plan: - Check CBC, iron panel/TIBC today -No NSAIDs (ibuprofen ) - Recommend GERD diet, no late meals  -Will start Omeprazole  40 mg po daily  - Schedule EGD with possible dilatation in [facility] with Dr. Federico. The risks and benefits of EGD with possible biopsies and esophageal dilation were discussed with the patient who agrees to proceed. -Schedule for a colonoscopy in [facility] with Dr.Dorsey. The risks and benefits of colonoscopy with possible polypectomy / biopsies were discussed and the patient agrees to proceed.  Thank you for the courtesy of this consult. Please call me with any questions or concerns.   Cabell Lazenby, FNP-C [facility] 02/06/2024, 11:44 AM  Cc: Tower, Laine LABOR, MD

## 2024-02-06 NOTE — Patient Instructions (Addendum)
 Iron deficiency anemia We will recheck levels today No NSAIDs (ibuprofen ), increases risk of stomach ulcers    GERD Recommend GERD diet, no late meals 3-4 hours before lying  Will start Omeprazole  40 mg po daily , take 1 tablet 30-45 minutes before lying down  Your provider has requested that you go to the basement level for lab work before leaving today. Press B on the elevator. The lab is located at the first door on the left as you exit the elevator.  _______________________________________________________  If your blood pressure at your visit was 140/90 or greater, please contact your primary care physician to follow up on this.  _______________________________________________________  If you are age 55 or older, your body mass index should be between 23-30. Your Body mass index is 25.85 kg/m. If this is out of the aforementioned range listed, please consider follow up with your Primary Care Provider.  If you are age 24 or younger, your body mass index should be between 19-25. Your Body mass index is 25.85 kg/m. If this is out of the aformentioned range listed, please consider follow up with your Primary Care Provider.   ________________________________________________________  The [facility] providers would like to encourage you to use MYCHART to communicate with providers for non-urgent requests or questions.  Due to long hold times on the telephone, sending your provider a message by [facility] may be a faster and more efficient way to get a response.  Please allow 48 business hours for a response.  Please remember that this is for non-urgent requests.  _______________________________________________________  [facility] is using a team-based approach to care.  Your team is made up of your doctor and two to three APPS. Our APPS (Nurse Practitioners and Physician Assistants) work with your physician to ensure care continuity for you. They are fully qualified to address  your health concerns and develop a treatment plan. They communicate directly with your gastroenterologist to care for you. Seeing the Advanced Practice Practitioners on your physician's team can help you by facilitating care more promptly, often allowing for earlier appointments, access to diagnostic testing, procedures, and other specialty referrals.   Thank you for trusting me with your gastrointestinal care. Deanna May, NP-C

## 2024-02-06 NOTE — Progress Notes (Signed)
 I agree with the assessment and plan as outlined by Ms. May.

## 2024-02-06 NOTE — Telephone Encounter (Signed)
 Patient has been scheduled for endoscopy and colonoscopy 9/10 and PV 8/27. Patient also requesting a call regarding previous note. Please advise, thank you

## 2024-02-17 NOTE — Telephone Encounter (Signed)
-----   Message from Cathryne PARAS May sent at 02/06/2024  2:38 PM EDT ----- Karna- Let her know her iron level is still low. She will need to start iron pill which I can send to her pharmacy. She will need to stop iron 5 days before procedures. Her hgb has improved some 11.9 to  12.6.  Cathryne, NP ----- Message ----- From: Interface, Lab In Three Zero One Sent: 02/06/2024   2:20 PM EDT To: Cathryne PARAS May, NP

## 2024-03-04 ENCOUNTER — Ambulatory Visit (AMBULATORY_SURGERY_CENTER)

## 2024-03-04 VITALS — Ht 59.0 in | Wt 127.0 lb

## 2024-03-04 DIAGNOSIS — D509 Iron deficiency anemia, unspecified: Secondary | ICD-10-CM

## 2024-03-04 MED ORDER — NA SULFATE-K SULFATE-MG SULF 17.5-3.13-1.6 GM/177ML PO SOLN: 1.0000 | Freq: Once | ORAL | 0 refills | Status: AC

## 2024-03-04 NOTE — Progress Notes (Signed)
 No issues known to pt with past sedation with any surgeries or procedures Patient denies ever being told they had issues or difficulty with intubation  No FH of Malignant Hyperthermia Pt is not on diet pills Pt is not on home 02  Pt is not on blood thinners  Pt denies issues with chronic constipation  No A fib or A flutter Have any cardiac testing pending--no Pt instructed to use Single[email or web address] or GoodRx for a price reduction on prep  Ambulates independently

## 2024-03-05 ENCOUNTER — Encounter: Payer: Self-pay | Admitting: Internal Medicine

## 2024-03-18 ENCOUNTER — Ambulatory Visit: Admitting: Internal Medicine

## 2024-03-18 ENCOUNTER — Encounter: Payer: Self-pay | Admitting: Internal Medicine

## 2024-03-18 VITALS — BP 129/84 | HR 83 | Temp 98.1°F | Resp 10

## 2024-03-18 DIAGNOSIS — K648 Other hemorrhoids: Secondary | ICD-10-CM | POA: Diagnosis not present

## 2024-03-18 DIAGNOSIS — R1310 Dysphagia, unspecified: Secondary | ICD-10-CM | POA: Diagnosis not present

## 2024-03-18 DIAGNOSIS — K449 Diaphragmatic hernia without obstruction or gangrene: Secondary | ICD-10-CM

## 2024-03-18 DIAGNOSIS — K319 Disease of stomach and duodenum, unspecified: Secondary | ICD-10-CM | POA: Diagnosis not present

## 2024-03-18 DIAGNOSIS — K5730 Diverticulosis of large intestine without perforation or abscess without bleeding: Secondary | ICD-10-CM

## 2024-03-18 DIAGNOSIS — Z1211 Encounter for screening for malignant neoplasm of colon: Secondary | ICD-10-CM

## 2024-03-18 DIAGNOSIS — K2970 Gastritis, unspecified, without bleeding: Secondary | ICD-10-CM

## 2024-03-18 DIAGNOSIS — K3189 Other diseases of stomach and duodenum: Secondary | ICD-10-CM | POA: Diagnosis not present

## 2024-03-18 DIAGNOSIS — K219 Gastro-esophageal reflux disease without esophagitis: Secondary | ICD-10-CM

## 2024-03-18 DIAGNOSIS — D120 Benign neoplasm of cecum: Secondary | ICD-10-CM | POA: Diagnosis not present

## 2024-03-18 DIAGNOSIS — D509 Iron deficiency anemia, unspecified: Secondary | ICD-10-CM

## 2024-03-18 DIAGNOSIS — K222 Esophageal obstruction: Secondary | ICD-10-CM | POA: Diagnosis not present

## 2024-03-18 DIAGNOSIS — K229 Disease of esophagus, unspecified: Secondary | ICD-10-CM | POA: Diagnosis not present

## 2024-03-18 MED ORDER — SODIUM CHLORIDE 0.9 % IV SOLN: 500.0000 mL | Freq: Once | INTRAVENOUS | Status: DC

## 2024-03-18 NOTE — Progress Notes (Signed)
 Vss nad trans to pacu

## 2024-03-18 NOTE — Patient Instructions (Signed)

## 2024-03-18 NOTE — Progress Notes (Signed)
 Called to room to assist during endoscopic procedure.  Patient ID and intended procedure confirmed with present staff. Received instructions for my participation in the procedure from the performing physician.

## 2024-03-18 NOTE — Progress Notes (Signed)
 Pt's states no medical or surgical changes since previsit or office visit.

## 2024-03-18 NOTE — Op Note (Signed)
  [facility] Patient Name: Joann Buchanan Procedure Date: 03/18/2024 8:12 AM MRN: [id number] Endoscopist: Rosario Estefana Kidney , , [phone] Age: 78 Referring MD:  Date of Birth: 12-09-1945 Gender: Female Account #: [id number] Procedure:                Colonoscopy Indications:              Screening for colorectal malignant neoplasm,                            Incidental - Iron deficiency anemia Medicines:                Monitored Anesthesia Care Procedure:                Pre-Anesthesia Assessment:                           - Prior to the procedure, a History and Physical                            was performed, and patient medications and                            allergies were reviewed. The patient's tolerance of                            previous anesthesia was also reviewed. The risks                            and benefits of the procedure and the sedation                            options and risks were discussed with the patient.                            All questions were answered, and informed consent                            was obtained. Prior Anticoagulants: The patient has                            taken no anticoagulant or antiplatelet agents. ASA                            Grade Assessment: II - A patient with mild systemic                            disease. After reviewing the risks and benefits,                            the patient was deemed in satisfactory condition to                            undergo the procedure.  After obtaining informed consent, the colonoscope                            was passed under direct vision. Throughout the                            procedure, the patient's blood pressure, pulse, and                            oxygen saturations were monitored continuously. The                            [id number]-[id number] Colonoscope [id number] was introduced                            through the anus and  advanced to the the terminal                            ileum. The colonoscopy was performed without                            difficulty. The patient tolerated the procedure                            well. The quality of the bowel preparation was                            excellent. The terminal ileum, the ileocecal valve                            and the rectum were photographed. Scope In: 8:50:48 AM Scope Out: 9:04:15 AM Scope Withdrawal Time: 0 hours 10 minutes 29 seconds  Total Procedure Duration: 0 hours 13 minutes 27 seconds  Findings:                 The terminal ileum appeared normal.                           A 4 mm polyp was found in the cecum. The polyp was                            sessile. The polyp was removed with a cold snare.                            Resection and retrieval were complete.                           Multiple diverticula were found in the sigmoid                            colon and descending colon.                           Non-bleeding internal hemorrhoids were found during  retroflexion. Complications:            No immediate complications. Estimated Blood Loss:     Estimated blood loss was minimal. Impression:               - The examined portion of the ileum was normal.                           - One 4 mm polyp in the cecum, removed with a cold                            snare. Resected and retrieved.                           - Diverticulosis in the sigmoid colon and in the                            descending colon.                           - Non-bleeding internal hemorrhoids. Recommendation:           - Discharge patient to home (with escort).                           - Await pathology results.                           - The findings and recommendations were discussed                            with the patient. Dr Estefana Federico Rosario Estefana Federico,  03/18/2024 9:14:35 AM

## 2024-03-18 NOTE — Op Note (Signed)
 [facility] Patient Name: Joann Buchanan Procedure Date: 03/18/2024 8:26 AM MRN: [id number] Endoscopist: Rosario Estefana Kidney , , [phone] Age: 78 Referring MD:  Date of Birth: 1946-07-02 Gender: Female Account #: [id number] Procedure:                Upper GI endoscopy Indications:              Iron deficiency anemia, Dysphagia Medicines:                Monitored Anesthesia Care Procedure:                Pre-Anesthesia Assessment:                           - Prior to the procedure, a History and Physical                            was performed, and patient medications and                            allergies were reviewed. The patient's tolerance of                            previous anesthesia was also reviewed. The risks                            and benefits of the procedure and the sedation                            options and risks were discussed with the patient.                            All questions were answered, and informed consent                            was obtained. Prior Anticoagulants: The patient has                            taken no anticoagulant or antiplatelet agents. ASA                            Grade Assessment: II - A patient with mild systemic                            disease. After reviewing the risks and benefits,                            the patient was deemed in satisfactory condition to                            undergo the procedure.                           After obtaining informed consent, the endoscope was  passed under direct vision. Throughout the                            procedure, the patient's blood pressure, pulse, and                            oxygen saturations were monitored continuously. The                            Olympus Scope [id number] was introduced through the                            mouth, and advanced to the second part of duodenum.                            The upper GI  endoscopy was accomplished without                            difficulty. The patient tolerated the procedure                            well. Scope In: Scope Out: Findings:                 One benign-appearing, intrinsic moderate                            (circumferential scarring or stenosis; an endoscope                            may pass) stenosis was found at the                            gastroesophageal junction. This stenosis measured                            less than one cm (in length). The stenosis was                            traversed. A TTS dilator was passed through the                            scope. Dilation with a 15-16.5-18 mm balloon                            dilator was performed to 18 mm. The dilation site                            was examined and showed mild mucosal disruption.                           Biopsies were taken with a cold forceps in the  entire esophagus for histology.                           A small hiatal hernia was present.                           Localized inflammation characterized by adherent                            blood, congestion (edema), erosions and erythema                            was found in the gastric antrum. Biopsies were                            taken with a cold forceps for histology.                           The examined duodenum was normal. Complications:            No immediate complications. Estimated Blood Loss:     Estimated blood loss was minimal. Impression:               - Benign-appearing esophageal stenosis. Dilated.                           - Small hiatal hernia.                           - Gastritis. Biopsied.                           - Normal examined duodenum.                           - Biopsies were taken with a cold forceps for                            histology in the entire esophagus. Recommendation:           - Await pathology results.                            - Omeprazole  40 gm BID for 8 weeks, then QD                           - Return to GI clinic in 2-3 months.                           - Perform a colonoscopy today. Dr Estefana Federico Rosario Estefana Federico,  03/18/2024 9:12:13 AM

## 2024-03-18 NOTE — Progress Notes (Signed)
 GASTROENTEROLOGY PROCEDURE H&P NOTE   Primary Care Physician: Tower, Laine LABOR, MD    Reason for Procedure:   IDA, GERD, dysphagia  Plan:    EGD/colonoscopy  Patient is appropriate for endoscopic procedure(s) in the ambulatory ([facility]) setting.  The nature of the procedure, as well as the risks, benefits, and alternatives were carefully and thoroughly reviewed with the patient. Ample time for discussion and questions allowed. The patient understood, was satisfied, and agreed to proceed.     HPI: Joann Buchanan is a 78 y.o. female who presents for EGD/colonoscopy for evaluation of IDA, GERD, dysphagia .  Patient was most recently seen in the Gastroenterology Clinic on 02/06/24.  No interval change in medical history since that appointment. Please refer to that note for full details regarding GI history and clinical presentation.   EGD 04/07/13:   Past Medical History:  Diagnosis Date   Allergic rhinitis    Anemia    GERD (gastroesophageal reflux disease)    Goiter    ?    HTN (hypertension)    Hx of fibrocystic disease of breast    Melanoma (HCC)    Migraine    Numbness and tingling    bilateral hands   Osteoarthritis of hand    PONV (postoperative nausea and vomiting)     Past Surgical History:  Procedure Laterality Date   ANTERIOR CERVICAL DECOMP/DISCECTOMY FUSION N/A 12/02/2015   Procedure: ANTERIOR CERVICAL DECOMPRESSION/DISCECTOMY FUSION CERVICAL FOUR-FIVE;  Surgeon: Alm GORMAN Molt, MD;  Location: [facility];  Service: Neurosurgery;  Laterality: N/A;   APPENDECTOMY     BREAST BIOPSY     normal   CATARACT EXTRACTION Right 07/2015   CATARACT EXTRACTION W/ INTRAOCULAR LENS IMPLANT Left 2018   DEXA  01/2006   negative   EMG     left upper arm- neg   ESOPHAGOGASTRODUODENOSCOPY     normal-24 hour PH probe (-)   EYE SURGERY  04/2008   Left eye; for double vision   PARTIAL HYSTERECTOMY     Ovaries intact   TUBAL LIGATION      Prior to Admission medications    Medication Sig Start Date End Date Taking? Authorizing Provider  ALPRAZolam  (XANAX ) 0.25 MG tablet Take 0.25 mg by mouth daily as needed (for airplane flights). Patient not taking: Reported on 03/04/2024    [provider]  cholecalciferol (VITAMIN D3) 25 MCG (1000 UNIT) tablet Take 4,000 Units by mouth daily. Patient not taking: Reported on 03/04/2024    [provider]  ferrous gluconate  (FERGON) 324 MG tablet Take 1 tablet (324 mg total) by mouth daily with breakfast. Patient not taking: Reported on 03/04/2024 02/06/24   May, Deanna J, NP  fluticasone  (FLONASE ) 50 MCG/ACT nasal spray Place 2 sprays into both nostrils daily as needed for allergies or rhinitis. 04/02/23   Tower, Laine LABOR, MD  gabapentin  (NEURONTIN ) 100 MG capsule Take 1 capsule (100 mg total) by mouth daily. Mid day Patient not taking: Reported on 03/04/2024 04/02/23   Tower, Laine LABOR, MD  gabapentin  (NEURONTIN ) 300 MG capsule TAKE 1 CAPSULE BY MOUTH TWICE A DAY 12/09/23   Tower, Laine LABOR, MD  meloxicam (MOBIC) 15 MG tablet Take 15 mg by mouth daily as needed. 12/06/23   [provider]  omeprazole  (PRILOSEC) 40 MG capsule Take 1 capsule (40 mg total) by mouth daily. 02/06/24   May, Deanna J, NP  Polyethyl Glycol-Propyl Glycol 0.4-0.3 % SOLN Place 1 drop into both eyes as needed. Patient  not taking: Reported on 03/04/2024    [provider]  predniSONE  (DELTASONE ) 5 MG tablet Take 5 mg by mouth as directed. 02/28/24   [provider]  traMADol  (ULTRAM ) 50 MG tablet Take 50 mg by mouth every 6 (six) hours. 10/15/23   [provider]  traZODone  (DESYREL ) 50 MG tablet TAKE 1-2 TABLETS (50-100 MG TOTAL) BY MOUTH AT BEDTIME AS NEEDED. FOR SLEEP 09/17/22   Tower, Laine LABOR, MD    Current Outpatient Medications  Medication Sig Dispense Refill   ALPRAZolam  (XANAX ) 0.25 MG tablet Take 0.25 mg by mouth daily as needed (for airplane flights). (Patient not taking: Reported on 03/04/2024)      cholecalciferol (VITAMIN D3) 25 MCG (1000 UNIT) tablet Take 4,000 Units by mouth daily. (Patient not taking: Reported on 03/04/2024)     ferrous gluconate  (FERGON) 324 MG tablet Take 1 tablet (324 mg total) by mouth daily with breakfast. (Patient not taking: Reported on 03/04/2024) 30 tablet 2   fluticasone  (FLONASE ) 50 MCG/ACT nasal spray Place 2 sprays into both nostrils daily as needed for allergies or rhinitis. 16 mL 2   gabapentin  (NEURONTIN ) 100 MG capsule Take 1 capsule (100 mg total) by mouth daily. Mid day (Patient not taking: Reported on 03/04/2024) 90 capsule 1   gabapentin  (NEURONTIN ) 300 MG capsule TAKE 1 CAPSULE BY MOUTH TWICE A DAY 180 capsule 1   meloxicam (MOBIC) 15 MG tablet Take 15 mg by mouth daily as needed.     omeprazole  (PRILOSEC) 40 MG capsule Take 1 capsule (40 mg total) by mouth daily. 90 capsule 3   Polyethyl Glycol-Propyl Glycol 0.4-0.3 % SOLN Place 1 drop into both eyes as needed. (Patient not taking: Reported on 03/04/2024)     predniSONE  (DELTASONE ) 5 MG tablet Take 5 mg by mouth as directed.     traMADol  (ULTRAM ) 50 MG tablet Take 50 mg by mouth every 6 (six) hours.     traZODone  (DESYREL ) 50 MG tablet TAKE 1-2 TABLETS (50-100 MG TOTAL) BY MOUTH AT BEDTIME AS NEEDED. FOR SLEEP 60 tablet 11   Current Facility-Administered Medications  Medication Dose Route Frequency Provider Last Rate Last Admin   0.9 %  sodium chloride  infusion  500 mL Intravenous Once Federico Rosario BROCKS, MD        Allergies as of 03/18/2024 - Review Complete 03/18/2024  Allergen Reaction Noted   Sulfonamide derivatives Hives, Swelling, and Other (See Comments) 11/06/2023    Family History  Problem Relation Age of Onset   Breast cancer Mother        Died, 77   Other Father        Died, 22 - tree fell on him   Breast cancer Sister        Died, 98   Heart disease Sister        Died, 26   Healthy Daughter    Healthy Son    Colon cancer Neg Hx    Esophageal cancer Neg Hx    Rectal cancer Neg  Hx    Stomach cancer Neg Hx    Colon polyps Neg Hx     Social History   Socioeconomic History   Marital status: Widowed    Spouse name: Not on file   Number of children: 2   Years of education: Not on file   Highest education level: Not on file  Occupational History   Occupation: Retired    Associate Professor: UNEMPLOYED  Tobacco Use   Smoking status: Never  Smokeless tobacco: Never  Vaping Use   Vaping status: Never Used  Substance and Sexual Activity   Alcohol  use: No    Alcohol /week: 0.0 standard drinks of alcohol    Drug use: No   Sexual activity: Not Currently  Other Topics Concern   Not on file  Social History Narrative   Retired Naval architect for [employer]   Married; 2 children; 3 grand children         Right Handed    Lives in a one story home. Lives alone.       Social Drivers of Corporate investment banker Strain: Low Risk  (10/25/2022)   Overall Financial Resource Strain (CARDIA)    Difficulty of Paying Living Expenses: Not hard at all  Food Insecurity: No Food Insecurity (10/25/2022)   Hunger Vital Sign    Worried About Running Out of Food in the Last Year: Never true    Ran Out of Food in the Last Year: Never true  Transportation Needs: No Transportation Needs (10/25/2022)   PRAPARE - Administrator, Civil Service (Medical): No    Lack of Transportation (Non-Medical): No  Physical Activity: Sufficiently Active (10/25/2022)   Exercise Vital Sign    Days of Exercise per Week: 7 days    Minutes of Exercise per Session: 60 min  Stress: No Stress Concern Present (10/25/2022)   [employer] of Occupational Health - Occupational Stress Questionnaire    Feeling of Stress : Not at all  Social Connections: Moderately Integrated (10/25/2022)   Social Connection and Isolation Panel    Frequency of Communication with Friends and Family: More than three times a week    Frequency of Social Gatherings with Friends and Family: More than three times a  week    Attends Religious Services: More than 4 times per year    Active Member of [employer] or Organizations: Yes    Attends Banker Meetings: More than 4 times per year    Marital Status: Widowed  Intimate Partner Violence: Not At Risk (10/25/2022)   Humiliation, Afraid, Rape, and Kick questionnaire    Fear of Current or Ex-Partner: No    Emotionally Abused: No    Physically Abused: No    Sexually Abused: No    Physical Exam: Vital signs in last 24 hours: There were no vitals taken for this visit. GEN: NAD EYE: Sclerae anicteric ENT: MMM CV: Non-tachycardic Pulm: No increased WOB GI: Soft NEURO:  Alert & Oriented   Estefana Kidney, MD [facility]   03/18/2024 7:38 AM

## 2024-03-19 ENCOUNTER — Telehealth: Payer: Self-pay | Admitting: *Deleted

## 2024-03-19 NOTE — Telephone Encounter (Signed)
  Follow up Call-     03/18/2024    7:41 AM  Call back number  Post procedure Call Back phone  # [phone]  Permission to leave phone message Yes     Patient questions:  Do you have a fever, pain , or abdominal swelling? No. Pain Score  0 *  Have you tolerated food without any problems? Yes.    Have you been able to return to your normal activities? Yes.    Do you have any questions about your discharge instructions: Diet   No. Medications  No. Follow up visit  No.  Do you have questions or concerns about your Care? No.  Actions: * If pain score is 4 or above: No action needed, pain <4.

## 2024-03-20 LAB — SURGICAL PATHOLOGY

## 2024-03-22 ENCOUNTER — Ambulatory Visit: Payer: Self-pay | Admitting: Internal Medicine

## 2024-03-23 ENCOUNTER — Ambulatory Visit: Admitting: Neurology

## 2024-06-03 ENCOUNTER — Ambulatory Visit: Admitting: General Practice

## 2024-06-03 ENCOUNTER — Ambulatory Visit: Payer: Self-pay

## 2024-06-03 ENCOUNTER — Telehealth: Admitting: General Practice

## 2024-06-03 ENCOUNTER — Encounter: Payer: Self-pay | Admitting: General Practice

## 2024-06-03 ENCOUNTER — Other Ambulatory Visit

## 2024-06-03 ENCOUNTER — Encounter: Payer: Self-pay | Admitting: Gastroenterology

## 2024-06-03 ENCOUNTER — Ambulatory Visit: Admitting: Gastroenterology

## 2024-06-03 VITALS — BP 122/70 | HR 80 | Ht 59.0 in | Wt 129.0 lb

## 2024-06-03 VITALS — BP 122/70 | HR 80 | Ht 59.0 in | Wt 129.1 lb

## 2024-06-03 DIAGNOSIS — K219 Gastro-esophageal reflux disease without esophagitis: Secondary | ICD-10-CM

## 2024-06-03 DIAGNOSIS — R609 Edema, unspecified: Secondary | ICD-10-CM | POA: Diagnosis not present

## 2024-06-03 DIAGNOSIS — D509 Iron deficiency anemia, unspecified: Secondary | ICD-10-CM

## 2024-06-03 LAB — CBC WITH DIFFERENTIAL/PLATELET
Basophils Absolute: 0 K/uL (ref 0.0–0.1)
Basophils Relative: 0.6 % (ref 0.0–3.0)
Eosinophils Absolute: 0.1 K/uL (ref 0.0–0.7)
Eosinophils Relative: 1.4 % (ref 0.0–5.0)
HCT: 35.5 % — ABNORMAL LOW (ref 36.0–46.0)
Hemoglobin: 12 g/dL (ref 12.0–15.0)
Lymphocytes Relative: 22.7 % (ref 12.0–46.0)
Lymphs Abs: 1.7 K/uL (ref 0.7–4.0)
MCHC: 33.7 g/dL (ref 30.0–36.0)
MCV: 89.7 fl (ref 78.0–100.0)
Monocytes Absolute: 1.1 K/uL — ABNORMAL HIGH (ref 0.1–1.0)
Monocytes Relative: 14.8 % — ABNORMAL HIGH (ref 3.0–12.0)
Neutro Abs: 4.6 K/uL (ref 1.4–7.7)
Neutrophils Relative %: 60.5 % (ref 43.0–77.0)
Platelets: 469 K/uL — ABNORMAL HIGH (ref 150.0–400.0)
RBC: 3.96 Mil/uL (ref 3.87–5.11)
RDW: 15 % (ref 11.5–15.5)
WBC: 7.7 K/uL (ref 4.0–10.5)

## 2024-06-03 LAB — IBC + FERRITIN
Ferritin: 44.1 ng/mL (ref 10.0–291.0)
Iron: 30 ug/dL — ABNORMAL LOW (ref 42–145)
Saturation Ratios: 9.1 % — ABNORMAL LOW (ref 20.0–50.0)
TIBC: 329 ug/dL (ref 250.0–450.0)
Transferrin: 235 mg/dL (ref 212.0–360.0)

## 2024-06-03 MED ORDER — PANTOPRAZOLE SODIUM 40 MG PO TBEC: 40.0000 mg | DELAYED_RELEASE_TABLET | Freq: Every day | ORAL | 2 refills | Status: AC

## 2024-06-03 NOTE — Progress Notes (Signed)
 Chief Complaint:follow-up low iron Primary GI Doctor:Dr. Federico  HPI:  Patient is a  78  year old female patient with past medical history of history of TIA 2023 (on Plavix  x 20d), HTN, hyperlipidemia, who was referred to me by Randeen Laine LABOR, MD on 11/20/23 for a evaluation of low iron.     Interval History Patient last seen in GI office on 02/06/24 by myself.  Patient presents for follow-up of anemia. We reviewed her endoscopy and colonoscopy.  Patient not taking any iron supplement.   Patient tearful today, reporting she has several things going on without any improvement.  Patient has swelling in hands, lower extremities, and knees. She was recently started on Symponi for RA and has received two doses, doesn't feel its improving at all. Reports steroids helped her the most.   Patient reports her dysphagia has resolved, but reports her throat is still raw. She took the  Omeprazole  40 gm BID for 8 weeks, then once every day. She does not feel this helped with her symptoms of reflux.  Patient denies altered bowel habits, abdominal pain, or rectal bleeding.  No alcohol  use. Nonsmoker.   Surgical history: hysterectomy, carpel tunnel surgery  Patient's family history includes: Breast CA in mother and sister  Wt Readings from Last 3 Encounters:  06/03/24 129 lb 2 oz (58.6 kg)  03/04/24 127 lb (57.6 kg)  02/06/24 128 lb (58.1 kg)     Past Medical History:  Diagnosis Date   Allergic rhinitis    Anemia    GERD (gastroesophageal reflux disease)    Goiter    ?    HTN (hypertension)    Hx of fibrocystic disease of breast    Melanoma (HCC)    Migraine    Numbness and tingling    bilateral hands   Osteoarthritis of hand    PONV (postoperative nausea and vomiting)     Past Surgical History:  Procedure Laterality Date   ANTERIOR CERVICAL DECOMP/DISCECTOMY FUSION N/A 12/02/2015   Procedure: ANTERIOR CERVICAL DECOMPRESSION/DISCECTOMY FUSION CERVICAL FOUR-FIVE;  Surgeon: Alm GORMAN Molt, MD;  Location: [facility];  Service: Neurosurgery;  Laterality: N/A;   APPENDECTOMY     BREAST BIOPSY     normal   CATARACT EXTRACTION Right 07/2015   CATARACT EXTRACTION W/ INTRAOCULAR LENS IMPLANT Left 2018   DEXA  01/2006   negative   EMG     left upper arm- neg   ESOPHAGOGASTRODUODENOSCOPY     normal-24 hour PH probe (-)   EYE SURGERY  04/2008   Left eye; for double vision   PARTIAL HYSTERECTOMY     Ovaries intact   TUBAL LIGATION      Current Outpatient Medications  Medication Sig Dispense Refill   acetaminophen  (TYLENOL  8 HOUR ARTHRITIS PAIN) 650 MG CR tablet      ALPRAZolam  (XANAX ) 0.25 MG tablet Take 0.25 mg by mouth daily as needed (for airplane flights).     cholecalciferol (VITAMIN D3) 25 MCG (1000 UNIT) tablet Take 4,000 Units by mouth daily.     ferrous gluconate  (FERGON) 324 MG tablet Take 1 tablet (324 mg total) by mouth daily with breakfast. 30 tablet 2   fluticasone  (FLONASE ) 50 MCG/ACT nasal spray Place 2 sprays into both nostrils daily as needed for allergies or rhinitis. 16 mL 2   gabapentin  (NEURONTIN ) 300 MG capsule TAKE 1 CAPSULE BY MOUTH TWICE A DAY 180 capsule 1   meloxicam (MOBIC) 15 MG tablet Take 15 mg by mouth  daily as needed.     pantoprazole  (PROTONIX ) 40 MG tablet Take 1 tablet (40 mg total) by mouth daily. 30 tablet 2   Polyethyl Glycol-Propyl Glycol 0.4-0.3 % SOLN Place 1 drop into both eyes as needed.     predniSONE  (DELTASONE ) 5 MG tablet Take 5 mg by mouth as directed.     traMADol  (ULTRAM ) 50 MG tablet Take 50 mg by mouth every 6 (six) hours.     traZODone  (DESYREL ) 50 MG tablet TAKE 1-2 TABLETS (50-100 MG TOTAL) BY MOUTH AT BEDTIME AS NEEDED. FOR SLEEP 60 tablet 11   tretinoin (RETIN-A) 0.025 % cream Apply 1 Application topically at bedtime.     gabapentin  (NEURONTIN ) 100 MG capsule Take 1 capsule (100 mg total) by mouth daily. Mid day (Patient not taking: Reported on 06/03/2024) 90 capsule 1   No current facility-administered  medications for this visit.    Allergies as of 06/03/2024 - Review Complete 06/03/2024  Allergen Reaction Noted   Sulfonamide derivatives Hives, Swelling, and Other (See Comments) 11/06/2023    Family History  Problem Relation Age of Onset   Breast cancer Mother        Died, 12   Other Father        Died, 82 - tree fell on him   Breast cancer Sister        Died, 53   Heart disease Sister        Died, 35   Healthy Daughter    Healthy Son    Colon cancer Neg Hx    Esophageal cancer Neg Hx    Rectal cancer Neg Hx    Stomach cancer Neg Hx    Colon polyps Neg Hx     Review of Systems:    Constitutional: No weight loss, fever, chills, weakness or fatigue HEENT: Eyes: No change in vision               Ears, Nose, Throat:  No change in hearing or congestion Skin: No rash or itching Cardiovascular: No chest pain, chest pressure or palpitations   Respiratory: No SOB or cough Gastrointestinal: See HPI and otherwise negative Genitourinary: No dysuria or change in urinary frequency Neurological: No headache, dizziness or syncope Musculoskeletal: No new muscle or joint pain Hematologic: No bleeding or bruising Psychiatric: No history of depression or anxiety    Physical Exam:  Vital signs: BP 122/70 (BP Location: Left Arm, Patient Position: Sitting, Cuff Size: Normal)   Pulse 80   Ht 4' 11 (1.499 m) Comment: height without shoes  Wt 129 lb 2 oz (58.6 kg)   BMI 26.08 kg/m   Constitutional:   Pleasant female appears to be in NAD, Well developed, Well nourished, alert and cooperative Throat: Oral cavity and pharynx without inflammation, swelling or lesion.  Respiratory: Respirations even and unlabored. Lungs clear to auscultation bilaterally.   No wheezes, crackles, or rhonchi.  Cardiovascular: Normal S1, S2. Regular rate and rhythm. No peripheral edema, cyanosis or pallor.  Gastrointestinal:  Soft, nondistended, nontender. No rebound or guarding. Normal bowel sounds. No  appreciable masses or hepatomegaly. Rectal:  Not performed.  Msk:  Symmetrical without gross deformities. Without edema, no deformity or joint abnormality.  Neurologic:  Alert and  oriented x4;  grossly normal neurologically.  Skin:   Dry and intact without significant lesions or rashes.  RELEVANT LABS AND IMAGING: CBC    Latest Ref Rng & Units 02/06/2024   12:11 PM 11/06/2023    3:32 PM 03/05/2022  1:13 PM  CBC  WBC 4.0 - 10.5 K/uL 11.5  9.0  7.9   Hemoglobin 12.0 - 15.0 g/dL 87.3  88.0  87.1   Hematocrit 36.0 - 46.0 % 39.0  36.2  39.3   Platelets 150.0 - 400.0 K/uL 415.0  359.0  320      CMP     Latest Ref Rng & Units 11/06/2023    3:32 PM 03/05/2022    1:13 PM 02/20/2022   12:35 PM  CMP  Glucose 70 - 99 mg/dL 99  897  889   BUN 6 - 23 mg/dL 10  17  14    Creatinine 0.40 - 1.20 mg/dL 9.19  9.11  9.09   Sodium 135 - 145 mEq/L 142  140  138   Potassium 3.5 - 5.1 mEq/L 4.2  3.1  4.3   Chloride 96 - 112 mEq/L 108  109  108   CO2 19 - 32 mEq/L 27  25  26    Calcium  8.4 - 10.5 mg/dL 9.6  89.8  9.5   Total Protein 6.0 - 8.3 g/dL 5.9  6.9  6.2   Total Bilirubin 0.2 - 1.2 mg/dL 0.5  0.5  0.6   Alkaline Phos 39 - 117 U/L 55  57  58   AST 0 - 37 U/L 15  19  16    ALT 0 - 35 U/L 17  15  11       Lab Results  Component Value Date   TSH 1.66 11/06/2023    Lab Results  Component Value Date   IRON 20 (L) 02/06/2024   TIBC 313.6 02/06/2024   FERRITIN 148.4 02/06/2024   Lab Results  Component Value Date   VITAMINB12 226 11/06/2023  02/06/24 labs hsow: iron 20, TIBC 313, ferritin 148 11/07/23 labs show: CRP  7.5 11/06/23 labs show: vitamin D  18.63, TSH 1.66, Normal LFT's, BUN 10, creat 0.80 02/2022 echo- Left ventricular ejection fraction, by estimation, is 55 to 60%.   GI procedures: 03/18/24 EGD - Benign- appearing esophageal stenosis. Dilated. - Small hiatal hernia. - Gastritis. Biopsied. - Normal examined duodenum. - Biopsies were taken with a cold forceps for histology in the  entire esophagus. Path: SABRA Surgical [P], gastric biopsy :       - MILD REACTIVE GASTROPATHY.       - NEGATIVE FOR H. PYLORI ON H&E STAIN       - NO INTESTINAL METAPLASIA, DYSPLASIA, OR MALIGNANCY.        2. Surgical [P], esophageal biopsy :       - SQUAMOUS MUCOSA WITH MILD REACTIVE CHANGES       - NEGATIVE FOR INCREASED INTRAEPITHELIAL EOSINOPHILS       - NEGATIVE FOR DYSPLASIA OR MALIGNANCY   03/18/24 colonoscopy, no further screening recommended due to age - The examined portion of the ileum was normal.  - One 4 mm polyp in the cecum, removed with a cold snare. Resected and retrieved.  - Diverticulosis in the sigmoid colon and in the descending colon.  - Non- bleeding internal hemorrhoids. Path: Surgical [P], colon, cecum, polyp (1) :       - POLYPOID FRAGMENT OF  BENIGN COLONIC MUCOSA WITH LYMPHOID AGGREGATE   04/07/2013 EGD with Dr. Jakie The duodenal mucosa showed no abnormalities in the bulb and second portion of the duodenum The mucosa of the stomach appeared normal. 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   Iron  deficiency anemia, unspecified iron deficiency anemia type Yes   Gastroesophageal reflux disease without esophagitis     Healthy 78 year old female patient with mild anemia >> Hgb 11.9>>12.6 and iron deficiency anemia, iron 30>>20, ferritin 148.4. Patient currently not on any iron supplementation.  Last checked in July we will go ahead and recheck CBC and iron levels today.  Will place patient on supplementation if still low.  Patient denies overt bleeding.  03/2024 colonoscopy with 1 HPP. 9/25 EGD with gastritis, Neg biopsy. Placed on PPI therapy BID daily, then every day. No improvement with GERD, will switch to Pantoprazole  40 mg po daily. Dysphagia resolved with dilatation.  Recommend she discontinue all NSAIDs due to risk of ulcers.   Plan: - Reheck CBC, iron panel/TIBC today -No NSAIDs  (ibuprofen ) - Recommend GERD diet, no late meals  -Switch to Pantoprazole  40 mg po daily  -she would like to hold off on oral iron until labs come back -recommend she follow-up with rheumatology for the joint pain and swelling  Thank you for the courtesy of this consult. Please call me with any questions or concerns.   Kayliana Codd, FNP-C [facility] 06/03/2024, 11:49 AM  Cc: Tower, Laine LABOR, MD

## 2024-06-03 NOTE — Telephone Encounter (Signed)
 Called [facility] spoke with Joann Buchanan and informed pt scheduled for video appt and pt stated normally the office usually call appointment her.  Transferred to Joann Buchanan and was informed that Joann Buchanan will give patient a call.

## 2024-06-03 NOTE — Patient Instructions (Addendum)
  Iron deficiency anemia Reheck CBC, iron panel/TIBC today No NSAIDs (ibuprofen )  GERD Recommend GERD diet, no late meals  Switch to Pantoprazole  40 mg po daily   Your provider has requested that you go to the basement level for lab work before leaving today. Press B on the elevator. The lab is located at the first door on the left as you exit the elevator.  _______________________________________________________  If your blood pressure at your visit was 140/90 or greater, please contact your primary care physician to follow up on this.  _______________________________________________________  If you are age 33 or older, your body mass index should be between 23-30. Your Body mass index is 26.08 kg/m. If this is out of the aforementioned range listed, please consider follow up with your Primary Care Provider.  If you are age 60 or younger, your body mass index should be between 19-25. Your Body mass index is 26.08 kg/m. If this is out of the aformentioned range listed, please consider follow up with your Primary Care Provider.   ________________________________________________________  The [facility] providers would like to encourage you to use MYCHART to communicate with providers for non-urgent requests or questions.  Due to long hold times on the telephone, sending your provider a message by [facility] may be a faster and more efficient way to get a response.  Please allow 48 business hours for a response.  Please remember that this is for non-urgent requests.  _______________________________________________________  [facility] is using a team-based approach to care.  Your team is made up of your doctor and two to three APPS. Our APPS (Nurse Practitioners and Physician Assistants) work with your physician to ensure care continuity for you. They are fully qualified to address your health concerns and develop a treatment plan. They communicate directly with your  gastroenterologist to care for you. Seeing the Advanced Practice Practitioners on your physician's team can help you by facilitating care more promptly, often allowing for earlier appointments, access to diagnostic testing, procedures, and other specialty referrals.   Thank you for trusting me with your gastrointestinal care. Deanna May, FNP-C

## 2024-06-03 NOTE — Patient Instructions (Addendum)
 Follow up with rheumatology as scheduled.  As discussed, please go to the ER if your symptoms worsen.   It was a pleasure meeting you!

## 2024-06-03 NOTE — Telephone Encounter (Signed)
 FYI Only or Action Required?: FYI only for provider: appointment scheduled on 06/03/2024.  Patient was last seen in primary care on 11/20/2023 by Randeen Laine LABOR, MD.  Called Nurse Triage reporting Advice Only.   Triage Disposition: Call PCP When Office is Open  Patient/caregiver understands and will follow disposition?: Yes   Copied from CRM [id number]. Topic: Clinical - Red Word Triage >> Jun 03, 2024  2:41 PM Joann Buchanan wrote: Reason for CRM: patient called stating she has been diagnosed with rheumatoid arthritis and she has been taking simponi aria infusion. Patient want know can she take prednisone  while she is taking the infusion. Patient has not had an infusion since nov.12 and her next one is dec.29. Patient stated the office is already closed and she cannot get in touch with them to find out the answer so she called us . The patient hands, knees and feet are swollen and her knees feel like jelly Reason for Disposition  [1] Caller requesting NON-URGENT health information AND [2] PCP's office is the best resource    Scheduled pt video visit with provider to ask medical/medicine question.  Answer Assessment - Initial Assessment Questions 1. REASON FOR CALL: What is the main reason for your call? or How can I best help you?  Pt calling about swelling in knees and feet: pt states she usually takes prednisone  and this helps tremendously with it.  Recently has started taking simponi aria infusion and last does 05/20/2024: patient wants to know if ok to take prednisone .  Please call patient.  Protocols used: Information Only Call - No Triage-A-AH

## 2024-06-03 NOTE — Progress Notes (Addendum)
 Virtual Visit via Video Note  I connected with Joann Buchanan on 06/03/24 at  3:40 PM EST by a telephone only due to patient not being able to get video and verified that I am speaking with the correct person using two identifiers.   On the visit- patient and provider, Carrol Aurora, NP  Patient Location: Home Provider Location: Office/Clinic  I discussed the limitations, risks, security, and privacy concerns of performing an evaluation and management service by video and the availability of in person appointments. I also discussed with the patient that there may be a patient responsible charge related to this service. The patient expressed understanding and agreed to proceed.  Subjective: PCP: Joann Joann LABOR, MD  Chief Complaint  Patient presents with   Edema    In legs, hands and feet. Patient was told to stop taking prednisone  nov 10th. Patient took prednisone  in the past for the swelling. She was just dx with RA and doing infusions. Last infusion was on 05/20/24 and next one is on 07/06/24.    HPI  Joann Buchanan is a 78 year old female, patient of Joann Randeen, MD presents today for an acute visit.   Discussed the use of AI scribe software for clinical note transcription with the patient, who gave verbal consent to proceed.  History of Present Illness Joann Buchanan is a 78 year old female with rheumatoid arthritis who presents with swelling in her hands, knees, ankles, and face.  She has been experiencing swelling in her hands, knees, ankles, and face for the past two weeks, which began suddenly the morning after receiving a Simponi Aria infusion on November 12th. The swelling fluctuates but has persisted, particularly affecting her hands and knees, leading to difficulty with mobility and ambulation.  She has a history of rheumatoid arthritis and has experienced similar joint swelling in the past. The pain in her hands is consistent with her previous rheumatoid arthritis pain. She  has not contacted her rheumatologist since the onset of swelling.  She is currently on Simponi Aria infusions, with the last dose administered on November 12th. She has not started prednisone .   ROS: Per HPI  Current Outpatient Medications:    acetaminophen  (TYLENOL  8 HOUR ARTHRITIS PAIN) 650 MG CR tablet, , Disp: , Rfl:    ALPRAZolam  (XANAX ) 0.25 MG tablet, Take 0.25 mg by mouth daily as needed (for airplane flights)., Disp: , Rfl:    cholecalciferol (VITAMIN D3) 25 MCG (1000 UNIT) tablet, Take 4,000 Units by mouth daily., Disp: , Rfl:    fluticasone  (FLONASE ) 50 MCG/ACT nasal spray, Place 2 sprays into both nostrils daily as needed for allergies or rhinitis., Disp: 16 mL, Rfl: 2   gabapentin  (NEURONTIN ) 100 MG capsule, Take 1 capsule (100 mg total) by mouth daily. Mid day, Disp: 90 capsule, Rfl: 1   gabapentin  (NEURONTIN ) 300 MG capsule, TAKE 1 CAPSULE BY MOUTH TWICE A DAY, Disp: 180 capsule, Rfl: 1   meloxicam (MOBIC) 15 MG tablet, Take 15 mg by mouth daily as needed., Disp: , Rfl:    pantoprazole  (PROTONIX ) 40 MG tablet, Take 1 tablet (40 mg total) by mouth daily., Disp: 30 tablet, Rfl: 2   Polyethyl Glycol-Propyl Glycol 0.4-0.3 % SOLN, Place 1 drop into both eyes as needed., Disp: , Rfl:    traMADol  (ULTRAM ) 50 MG tablet, Take 50 mg by mouth every 6 (six) hours., Disp: , Rfl:    traZODone  (DESYREL ) 50 MG tablet, TAKE 1-2 TABLETS (50-100 MG TOTAL) BY  MOUTH AT BEDTIME AS NEEDED. FOR SLEEP, Disp: 60 tablet, Rfl: 11   predniSONE  (DELTASONE ) 5 MG tablet, Take 5 mg by mouth as directed. (Patient not taking: Reported on 06/03/2024), Disp: , Rfl:    tretinoin (RETIN-A) 0.025 % cream, Apply 1 Application topically at bedtime. (Patient not taking: Reported on 06/03/2024), Disp: , Rfl:   Observations/Objective: Today's Vitals   06/03/24 1529  BP: 122/70  Pulse: 80  Weight: 129 lb (58.5 kg)  Height: 4' 11 (1.499 m)   Physical Exam Nursing note reviewed.  Eyes:     Conjunctiva/sclera:  Conjunctivae normal.  Pulmonary:     Effort: Pulmonary effort is normal.  Neurological:     Mental Status: She is alert and oriented to person, place, and time.  Psychiatric:        Mood and Affect: Mood normal.        Behavior: Behavior normal.        Thought Content: Thought content normal.        Judgment: Judgment normal.     Assessment and Plan: Swelling   Assessment and Plan Assessment & Plan Swelling of hands, knees, ankles, and face Acute swelling in hands, knees, ankles, and face for two weeks. Differential includes fluid overload versus rheumatoid arthritis exacerbation. Swelling persists in knees. In-person evaluation recommended. - unclear etiology.  - advised not to take prednisone  until she has discussed it with rheumatology. - Contact rheumatology for further evaluation. - Strict ER precautions provided. Seek urgent care or ER if shortness of breath, difficulty breathing, or worsening swelling occurs. - Verbalized understanding.  Rheumatoid arthritis Chronic rheumatoid arthritis with exacerbation. Joint pain and swelling in hands and knees. Current treatment includes Simponi Aria infusions. - Follow up with rheumatology.    Follow Up Instructions: Return if symptoms worsen or fail to improve.  I discussed the assessment and treatment plan with the patient. The patient was provided an opportunity to ask questions, and all were answered. The patient agreed with the plan and demonstrated an understanding of the instructions.   The patient was advised to call back or seek an in-person evaluation if the symptoms worsen or if the condition fails to improve as anticipated.  The above assessment and management plan was discussed with the patient. The patient verbalized understanding of and has agreed to the management plan.   I personally spent a total of 10.4 minutes in the care of the patient today.    Carrol Aurora, NP

## 2024-06-03 NOTE — Telephone Encounter (Signed)
 Called patient and visit has been done with Joann Buchanan. NP.

## 2024-06-07 ENCOUNTER — Other Ambulatory Visit: Payer: Self-pay | Admitting: Family Medicine

## 2024-06-08 ENCOUNTER — Ambulatory Visit: Payer: Self-pay | Admitting: Gastroenterology

## 2024-06-08 ENCOUNTER — Telehealth: Payer: Self-pay | Admitting: Gastroenterology

## 2024-06-08 DIAGNOSIS — D509 Iron deficiency anemia, unspecified: Secondary | ICD-10-CM

## 2024-06-08 MED ORDER — FERROUS GLUCONATE 324 (38 FE) MG PO TABS: 324.0000 mg | ORAL_TABLET | Freq: Every day | ORAL | 2 refills | Status: AC

## 2024-06-08 NOTE — Telephone Encounter (Signed)
 Spoke with patient about low iron, will place on iron supplement 3 months and recheck.

## 2024-06-09 ENCOUNTER — Other Ambulatory Visit: Payer: Self-pay

## 2024-06-09 DIAGNOSIS — D509 Iron deficiency anemia, unspecified: Secondary | ICD-10-CM

## 2024-06-09 NOTE — Telephone Encounter (Signed)
 Last filled on 12/09/23 #180 caps/ 1 refill   Pt had a recent acute appt with another provider but last f/u with PCP was on 11/20/23, no future appts

## 2024-06-22 NOTE — Progress Notes (Signed)
 Agree with the assessment and plan as outlined by [facility], FNP-C.    Ginni Eichler E. Tomasa Rand, MD [facility]
